# Patient Record
Sex: Male | Born: 1950 | Race: White | Hispanic: No | Marital: Married | Smoking: Former smoker
Health system: Southern US, Community
[De-identification: ages and names within clinical notes are randomized; demographics above are authoritative.]

## PROBLEM LIST (undated history)

## (undated) DIAGNOSIS — C3490 Malignant neoplasm of unspecified part of unspecified bronchus or lung: Secondary | ICD-10-CM

## (undated) DIAGNOSIS — I10 Essential (primary) hypertension: Secondary | ICD-10-CM

## (undated) DIAGNOSIS — J439 Emphysema, unspecified: Secondary | ICD-10-CM

## (undated) HISTORY — PX: COLONOSCOPY: SHX174

---

## 2011-09-13 ENCOUNTER — Other Ambulatory Visit: Payer: Self-pay | Admitting: Internal Medicine

## 2011-09-13 DIAGNOSIS — M79606 Pain in leg, unspecified: Secondary | ICD-10-CM

## 2011-09-16 ENCOUNTER — Other Ambulatory Visit: Payer: Self-pay | Admitting: Internal Medicine

## 2011-09-16 ENCOUNTER — Ambulatory Visit
Admission: RE | Admit: 2011-09-16 | Discharge: 2011-09-16 | Disposition: A | Discharge status: Home / Self Care | Payer: BC Managed Care – PPO | Source: Ambulatory Visit | Attending: Internal Medicine | Admitting: Internal Medicine

## 2011-09-16 DIAGNOSIS — M79606 Pain in leg, unspecified: Secondary | ICD-10-CM

## 2014-03-02 ENCOUNTER — Encounter (HOSPITAL_COMMUNITY): Payer: Self-pay | Admitting: Emergency Medicine

## 2014-03-02 ENCOUNTER — Emergency Department (HOSPITAL_COMMUNITY)
Admission: EM | Admit: 2014-03-02 | Discharge: 2014-03-02 | Disposition: A | Discharge status: Home / Self Care | Payer: BC Managed Care – PPO | Source: Home / Self Care | Attending: Family Medicine | Admitting: Family Medicine

## 2014-03-02 DIAGNOSIS — S30860A Insect bite (nonvenomous) of lower back and pelvis, initial encounter: Secondary | ICD-10-CM

## 2014-03-02 DIAGNOSIS — W57XXXA Bitten or stung by nonvenomous insect and other nonvenomous arthropods, initial encounter: Principal | ICD-10-CM

## 2014-03-02 DIAGNOSIS — S30861A Insect bite (nonvenomous) of abdominal wall, initial encounter: Secondary | ICD-10-CM

## 2014-03-02 MED ORDER — DOXYCYCLINE HYCLATE 100 MG PO CAPS: 100.0000 mg | ORAL_CAPSULE | Freq: Two times a day (BID) | ORAL | Status: DC

## 2014-03-02 NOTE — ED Notes (Signed)
Pt presents after removing a tick from his inguinal area today. Pt reports 4 days ago he noticed a tiny black spot with itching and thought it was a spider bite. Pt reports after removal area is tender, red and swollen. Pt denies fever. Pt is alert and oriented and in no acute distress.

## 2014-03-02 NOTE — ED Provider Notes (Signed)
CSN: [id number]     Arrival date & time 03/02/14  1253 History   First MD Initiated Contact with Patient 03/02/14 1358     Chief Complaint  Patient presents with  . Insect Bite   (Consider location/radiation/quality/duration/timing/severity/associated sxs/prior Treatment) Patient is a 63 y.o. male presenting with rash. The history is provided by the patient.  Rash Quality: painful, redness and swelling   Pain details:    Severity:  Mild   Progression:  Worsening Severity:  Mild Chronicity:  New Context comment:  Noticed tick with local rxn to left inguinal area this am, possible bite on tues.   History reviewed. No pertinent past medical history. History reviewed. No pertinent past surgical history. No family history on file. History  Substance Use Topics  . Smoking status: Current Every Day Smoker -- 1.00 packs/day    Types: Cigarettes  . Smokeless tobacco: Not on file  . Alcohol Use: Yes     Comment: occasionally    Review of Systems  Constitutional: Negative.   Skin: Positive for rash and wound.    Allergies  Review of patient's allergies indicates no known allergies.  Home Medications   Current Outpatient Rx  Name  Route  Sig  Dispense  Refill  . doxycycline (VIBRAMYCIN) 100 MG capsule   Oral   Take 1 capsule (100 mg total) by mouth 2 (two) times daily.   20 capsule   0    BP 120/86  Pulse 79  Temp(Src) 98.1 F (36.7 C) (Oral)  Resp 18  SpO2 100% Physical Exam  Nursing note and vitals reviewed. Constitutional: He is oriented to person, place, and time. He appears well-developed and well-nourished.  Neurological: He is alert and oriented to person, place, and time.  Skin: Skin is warm and dry. Rash noted. There is erythema.  Local left inguinal bite with sts and erythema .    ED Course  Procedures (including critical care time) Labs Review Labs Reviewed - No data to display Imaging Review No results found.   MDM   1. Tick bite of groin         Linna HoffJames D Meeghan Skipper, MD 03/02/14 1434

## 2015-10-14 ENCOUNTER — Ambulatory Visit (INDEPENDENT_AMBULATORY_CARE_PROVIDER_SITE_OTHER): Payer: BLUE CROSS/BLUE SHIELD | Admitting: Family Medicine

## 2015-10-14 VITALS — BP 120/80 | HR 65 | Temp 98.2°F | Resp 16 | Ht 68.1 in | Wt 146.0 lb

## 2015-10-14 DIAGNOSIS — S71101A Unspecified open wound, right thigh, initial encounter: Secondary | ICD-10-CM

## 2015-10-14 DIAGNOSIS — W540XXA Bitten by dog, initial encounter: Secondary | ICD-10-CM

## 2015-10-14 DIAGNOSIS — Z23 Encounter for immunization: Secondary | ICD-10-CM | POA: Diagnosis not present

## 2015-10-14 DIAGNOSIS — S71001A Unspecified open wound, right hip, initial encounter: Secondary | ICD-10-CM

## 2015-10-14 MED ORDER — AMOXICILLIN-POT CLAVULANATE 875-125 MG PO TABS: 1.0000 | ORAL_TABLET | Freq: Two times a day (BID) | ORAL | Status: DC

## 2015-10-14 NOTE — Progress Notes (Signed)
Patient ID: Joe HarveyJohn Morton, male    DOB: 07/12/1951  Age: 64 y.o. MRN: [id number]  Chief Complaint  Patient presents with  . 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 He was at a customer's site yesterday and their dog, a [place] shepherd, cervical him and attacked him, biting him on the right thigh. The patient had on blue jeans, and the wound penetrated the cough. He has not had any muscular type pains in the leg. He has bite marks separated by about 10 cm. Each of the sites has several little puncture wounds in 1 cm clusters. The dog owners were present at the time. The patient has checked the dog records, and the dog is up-to-date on vaccinations. The dog has a history of knitting at other people in the past.  Current allergies, medications, problem list, past/family and social histories reviewed.  Objective:  BP 120/80 mmHg  Pulse 65  Temp(Src) 98.2 F (36.8 C) (Oral)  Resp 16  Ht 5' 8.1" (1.73 m)  Wt 146 lb (66.225 kg)  BMI 22.13 kg/m2  SpO2 98%  Puncture wounds on right mid thigh, anterior aspect. Both wounds are a little over a centimeter in diameter with mild erythema surrounding them. He is able to extend the knee without any significant thigh pain.  Assessment & Plan:   Assessment: No diagnosis found.    Plan: Tetanus vaccination and antibiotics  No orders of the defined types were placed in this encounter.    No orders of the defined types were placed in this encounter.         There are no Patient Instructions on file for this visit.   No Follow-up on file.   Joe Vath, MD 10/14/2015

## 2015-10-14 NOTE — Patient Instructions (Signed)
Keep wound clean  Return if worsening signs of infection  Take Augmentin 875 one twice daily for infection (amoxicillin/clavulanate)  Tetanus vaccine will be given you today  Return as needed.

## 2017-12-29 ENCOUNTER — Other Ambulatory Visit: Payer: Self-pay | Admitting: Geriatric Medicine

## 2017-12-29 DIAGNOSIS — F17209 Nicotine dependence, unspecified, with unspecified nicotine-induced disorders: Secondary | ICD-10-CM

## 2018-01-01 ENCOUNTER — Ambulatory Visit
Admission: RE | Admit: 2018-01-01 | Discharge: 2018-01-01 | Disposition: A | Discharge status: Home / Self Care | Payer: Self-pay | Source: Ambulatory Visit | Attending: Geriatric Medicine | Admitting: Geriatric Medicine

## 2018-01-01 DIAGNOSIS — F17209 Nicotine dependence, unspecified, with unspecified nicotine-induced disorders: Secondary | ICD-10-CM

## 2018-07-24 ENCOUNTER — Ambulatory Visit: Payer: Self-pay | Admitting: General Surgery

## 2018-09-07 ENCOUNTER — Encounter (HOSPITAL_COMMUNITY): Payer: Self-pay

## 2018-09-07 NOTE — Pre-Procedure Instructions (Signed)
Joe Morton  09/07/2018      [id number] [place], [place] - [address] [place] [address] Phone: [phone] Fax: [phone]    Your procedure is scheduled on 09/17/2018.  Report to [name] Admitting at 0530 A.M.  Call this number if you have problems the morning of surgery:  [phone]   Remember:  Do not eat or drink after midnight.     Take these medicines the morning of surgery with A SIP OF WATER: NONE  7 days prior to surgery STOP taking any Aspirin(unless otherwise instructed by your surgeon), Aleve, Naproxen, Ibuprofen, Motrin, Advil, Goody's, BC's, all herbal medications, fish oil, and all vitamins     Do not wear jewelry.  Do not wear lotions, powders, or colognes, or deodorant.  Men may shave face and neck.  Do not bring valuables to the hospital.  [name] is not responsible for any belongings or valuables.  Contacts, eyeglasses, dentures or bridgework may not be worn into surgery.  Leave your suitcase in the car.  After surgery it may be brought to your room.  For patients admitted to the hospital, discharge time will be determined by your treatment team.  Patients discharged the day of surgery will not be allowed to drive home.   Name and phone number of your driver:    Special instructions:   Joe Morton- Preparing For Surgery  Before surgery, you can play an important role. Because skin is not sterile, your skin needs to be as free of germs as possible. You can reduce the number of germs on your skin by washing with CHG (chlorahexidine gluconate) Soap before surgery.  CHG is an antiseptic cleaner which kills germs and bonds with the skin to continue killing germs even after washing.    Oral Hygiene is also important to reduce your risk of infection.  Remember - BRUSH YOUR TEETH THE MORNING OF SURGERY WITH YOUR REGULAR TOOTHPASTE  Please do not use if you have an allergy to CHG or  antibacterial soaps. If your skin becomes reddened/irritated stop using the CHG.  Do not shave (including legs and underarms) for at least 48 hours prior to first CHG shower. It is OK to shave your face.  Please follow these instructions carefully.   1. Shower the NIGHT BEFORE SURGERY and the MORNING OF SURGERY with CHG.   2. If you chose to wash your hair, wash your hair first as usual with your normal shampoo.  3. After you shampoo, rinse your hair and body thoroughly to remove the shampoo.  4. Use CHG as you would any other liquid soap. You can apply CHG directly to the skin and wash gently with a scrungie or a clean washcloth.   5. Apply the CHG Soap to your body ONLY FROM THE NECK DOWN.  Do not use on open wounds or open sores. Avoid contact with your eyes, ears, mouth and genitals (private parts). Wash Face and genitals (private parts)  with your normal soap.  6. Wash thoroughly, paying special attention to the area where your surgery will be performed.  7. Thoroughly rinse your body with warm water from the neck down.  8. DO NOT shower/wash with your normal soap after using and rinsing off the CHG Soap.  9. Pat yourself dry with a CLEAN TOWEL.  10. Wear CLEAN PAJAMAS to bed the night before surgery, wear comfortable clothes the morning of surgery  11. Place  CLEAN SHEETS on your bed the night of your first shower and DO NOT SLEEP WITH PETS.    Day of Surgery: Shower as stated above. Do not apply any deodorants/lotions.  Please wear clean clothes to the hospital/surgery center.   Remember to brush your teeth WITH YOUR REGULAR TOOTHPASTE.    Please read over the following fact sheets that you were given. Coughing and Deep Breathing and Surgical Site Infection Prevention

## 2018-09-10 ENCOUNTER — Encounter (HOSPITAL_COMMUNITY)
Admission: RE | Admit: 2018-09-10 | Discharge: 2018-09-10 | Disposition: A | Discharge status: Home / Self Care | Payer: Medicare Other | Source: Ambulatory Visit | Attending: General Surgery | Admitting: General Surgery

## 2018-09-10 ENCOUNTER — Other Ambulatory Visit: Payer: Self-pay

## 2018-09-10 ENCOUNTER — Ambulatory Visit (HOSPITAL_COMMUNITY)
Admission: RE | Admit: 2018-09-10 | Discharge: 2018-09-10 | Disposition: A | Discharge status: Home / Self Care | Payer: Medicare Other | Source: Ambulatory Visit | Attending: General Surgery | Admitting: General Surgery

## 2018-09-10 ENCOUNTER — Encounter (HOSPITAL_COMMUNITY): Payer: Self-pay

## 2018-09-10 DIAGNOSIS — R918 Other nonspecific abnormal finding of lung field: Secondary | ICD-10-CM | POA: Diagnosis not present

## 2018-09-10 DIAGNOSIS — F17200 Nicotine dependence, unspecified, uncomplicated: Secondary | ICD-10-CM | POA: Insufficient documentation

## 2018-09-10 DIAGNOSIS — Z01818 Encounter for other preprocedural examination: Secondary | ICD-10-CM | POA: Diagnosis not present

## 2018-09-10 LAB — CBC WITH DIFFERENTIAL/PLATELET
ABS IMMATURE GRANULOCYTES: 0.04 10*3/uL (ref 0.00–0.07)
BASOS ABS: 0.1 10*3/uL (ref 0.0–0.1)
BASOS PCT: 1 %
Eosinophils Absolute: 0.2 10*3/uL (ref 0.0–0.5)
Eosinophils Relative: 3 %
HCT: 56.8 % — ABNORMAL HIGH (ref 39.0–52.0)
HEMOGLOBIN: 18.3 g/dL — AB (ref 13.0–17.0)
IMMATURE GRANULOCYTES: 1 %
LYMPHS PCT: 26 %
Lymphs Abs: 1.5 10*3/uL (ref 0.7–4.0)
MCH: 32.2 pg (ref 26.0–34.0)
MCHC: 32.2 g/dL (ref 30.0–36.0)
MCV: 99.8 fL (ref 80.0–100.0)
MONO ABS: 0.9 10*3/uL (ref 0.1–1.0)
Monocytes Relative: 15 %
NEUTROS ABS: 3.2 10*3/uL (ref 1.7–7.7)
NEUTROS PCT: 54 %
NRBC: 0 % (ref 0.0–0.2)
PLATELETS: 221 10*3/uL (ref 150–400)
RBC: 5.69 MIL/uL (ref 4.22–5.81)
RDW: 14.1 % (ref 11.5–15.5)
WBC: 5.9 10*3/uL (ref 4.0–10.5)

## 2018-09-10 LAB — BASIC METABOLIC PANEL
Anion gap: 9 (ref 5–15)
BUN: 8 mg/dL (ref 8–23)
CO2: 25 mmol/L (ref 22–32)
Calcium: 9.6 mg/dL (ref 8.9–10.3)
Chloride: 103 mmol/L (ref 98–111)
Creatinine, Ser: 0.89 mg/dL (ref 0.61–1.24)
Glucose, Bld: 100 mg/dL — ABNORMAL HIGH (ref 70–99)
Potassium: 4.5 mmol/L (ref 3.5–5.1)
SODIUM: 137 mmol/L (ref 135–145)

## 2018-09-10 NOTE — Progress Notes (Signed)
PCP - Dr. Pete Glatter  Chest x-ray - 09/10/18 EKG - 09/10/18  Blood Thinner Instructions: N/A Aspirin Instructions: N/A  Anesthesia review: none  Patient denies shortness of breath, fever, cough and chest pain at PAT appointment   Patient verbalized understanding of instructions that were given to them at the PAT appointment. Patient was also instructed that they will need to review over the PAT instructions again at home before surgery.

## 2018-09-14 MED ORDER — BUPIVACAINE LIPOSOME 1.3 % IJ SUSP
20.0000 mL | INTRAMUSCULAR | Status: AC
  Administered 2018-09-17: 10 mL
  Filled 2018-09-14: qty 20

## 2018-09-16 NOTE — Anesthesia Preprocedure Evaluation (Addendum)
Anesthesia Evaluation  Patient identified by MRN, date of birth, ID band Patient awake    Reviewed: Allergy & Precautions, NPO status , Patient's Chart, lab work & pertinent test results  Airway Mallampati: I       Dental no notable dental hx. (+) Teeth Intact   Pulmonary Current Smoker,    Pulmonary exam normal breath sounds clear to auscultation       Cardiovascular negative cardio ROS Normal cardiovascular exam Rhythm:Regular Rate:Normal     Neuro/Psych negative neurological ROS  negative psych ROS   GI/Hepatic negative GI ROS, Neg liver ROS,   Endo/Other  negative endocrine ROS  Renal/GU negative Renal ROS  negative genitourinary   Musculoskeletal negative musculoskeletal ROS (+)   Abdominal Normal abdominal exam  (+)   Peds  Hematology negative hematology ROS (+)   Anesthesia Other Findings   Reproductive/Obstetrics                            Anesthesia Physical Anesthesia Plan  ASA: II  Anesthesia Plan: General   Post-op Pain Management:  Regional for Post-op pain   Induction: Intravenous  PONV Risk Score and Plan:   Airway Management Planned: LMA  Additional Equipment:   Intra-op Plan:   Post-operative Plan: Extubation in OR  Informed Consent: I have reviewed the patients History and Physical, chart, labs and discussed the procedure including the risks, benefits and alternatives for the proposed anesthesia with the patient or authorized representative who has indicated his/her understanding and acceptance.     Plan Discussed with: CRNA and Surgeon  Anesthesia Plan Comments:        Anesthesia Quick Evaluation

## 2018-09-17 ENCOUNTER — Encounter (HOSPITAL_COMMUNITY): Payer: Self-pay

## 2018-09-17 ENCOUNTER — Ambulatory Visit (HOSPITAL_COMMUNITY): Payer: Medicare Other | Admitting: Anesthesiology

## 2018-09-17 ENCOUNTER — Encounter (HOSPITAL_COMMUNITY)
Admission: RE | Disposition: A | Discharge status: Home / Self Care | Payer: Self-pay | Source: Ambulatory Visit | Attending: General Surgery

## 2018-09-17 ENCOUNTER — Ambulatory Visit (HOSPITAL_COMMUNITY)
Admission: RE | Admit: 2018-09-17 | Discharge: 2018-09-17 | Disposition: A | Discharge status: Home / Self Care | Payer: Medicare Other | Source: Ambulatory Visit | Attending: General Surgery | Admitting: General Surgery

## 2018-09-17 ENCOUNTER — Other Ambulatory Visit: Payer: Self-pay

## 2018-09-17 DIAGNOSIS — K4090 Unilateral inguinal hernia, without obstruction or gangrene, not specified as recurrent: Secondary | ICD-10-CM | POA: Diagnosis not present

## 2018-09-17 DIAGNOSIS — F17200 Nicotine dependence, unspecified, uncomplicated: Secondary | ICD-10-CM | POA: Insufficient documentation

## 2018-09-17 HISTORY — PX: INSERTION OF MESH: SHX5868

## 2018-09-17 HISTORY — PX: INGUINAL HERNIA REPAIR: SHX194

## 2018-09-17 SURGERY — REPAIR, HERNIA, INGUINAL, ADULT
Anesthesia: General | Site: Inguinal | Laterality: Left

## 2018-09-17 MED ORDER — OXYCODONE HCL 5 MG/5ML PO SOLN: 5.0000 mg | Freq: Once | ORAL | Status: AC | PRN

## 2018-09-17 MED ORDER — LACTATED RINGERS IV SOLN
INTRAVENOUS | Status: DC | PRN
  Administered 2018-09-17: 07:00:00 via INTRAVENOUS

## 2018-09-17 MED ORDER — LIDOCAINE 2% (20 MG/ML) 5 ML SYRINGE
INTRAMUSCULAR | Status: DC | PRN
  Administered 2018-09-17: 100 mg via INTRAVENOUS

## 2018-09-17 MED ORDER — ONDANSETRON HCL 4 MG/2ML IJ SOLN: 4.0000 mg | Freq: Once | INTRAMUSCULAR | Status: DC | PRN

## 2018-09-17 MED ORDER — ACETAMINOPHEN 500 MG PO TABS
1000.0000 mg | ORAL_TABLET | ORAL | Status: AC
  Administered 2018-09-17: 1000 mg via ORAL
  Filled 2018-09-17: qty 2

## 2018-09-17 MED ORDER — PROPOFOL 10 MG/ML IV BOLUS
INTRAVENOUS | Status: DC | PRN
  Administered 2018-09-17: 150 mg via INTRAVENOUS

## 2018-09-17 MED ORDER — CHLORHEXIDINE GLUCONATE CLOTH 2 % EX PADS: 6.0000 | MEDICATED_PAD | Freq: Once | CUTANEOUS | Status: DC

## 2018-09-17 MED ORDER — MIDAZOLAM HCL 5 MG/5ML IJ SOLN
INTRAMUSCULAR | Status: DC | PRN
  Administered 2018-09-17 (×2): 1 mg via INTRAVENOUS

## 2018-09-17 MED ORDER — SODIUM CHLORIDE 0.9 % IV SOLN
INTRAVENOUS | Status: AC
  Filled 2018-09-17: qty 500000

## 2018-09-17 MED ORDER — ACETAMINOPHEN 160 MG/5ML PO SOLN: 325.0000 mg | ORAL | Status: DC | PRN

## 2018-09-17 MED ORDER — FENTANYL CITRATE (PF) 100 MCG/2ML IJ SOLN
INTRAMUSCULAR | Status: AC
  Filled 2018-09-17: qty 2

## 2018-09-17 MED ORDER — ACETAMINOPHEN 325 MG PO TABS: 325.0000 mg | ORAL_TABLET | ORAL | Status: DC | PRN

## 2018-09-17 MED ORDER — GABAPENTIN 300 MG PO CAPS
300.0000 mg | ORAL_CAPSULE | ORAL | Status: AC
  Administered 2018-09-17: 300 mg via ORAL
  Filled 2018-09-17: qty 1

## 2018-09-17 MED ORDER — MEPERIDINE HCL 50 MG/ML IJ SOLN: 6.2500 mg | INTRAMUSCULAR | Status: DC | PRN

## 2018-09-17 MED ORDER — ONDANSETRON HCL 4 MG/2ML IJ SOLN
INTRAMUSCULAR | Status: DC | PRN
  Administered 2018-09-17: 4 mg via INTRAVENOUS

## 2018-09-17 MED ORDER — FENTANYL CITRATE (PF) 100 MCG/2ML IJ SOLN
INTRAMUSCULAR | Status: DC | PRN
  Administered 2018-09-17 (×2): 50 ug via INTRAVENOUS
  Administered 2018-09-17: 25 ug via INTRAVENOUS
  Administered 2018-09-17: 50 ug via INTRAVENOUS
  Administered 2018-09-17: 25 ug via INTRAVENOUS
  Administered 2018-09-17: 50 ug via INTRAVENOUS

## 2018-09-17 MED ORDER — KETOROLAC TROMETHAMINE 30 MG/ML IJ SOLN: 30.0000 mg | Freq: Once | INTRAMUSCULAR | Status: DC | PRN

## 2018-09-17 MED ORDER — 0.9 % SODIUM CHLORIDE (POUR BTL) OPTIME
TOPICAL | Status: DC | PRN
  Administered 2018-09-17: 1000 mL

## 2018-09-17 MED ORDER — OXYCODONE HCL 5 MG PO TABS
ORAL_TABLET | ORAL | Status: AC
  Filled 2018-09-17: qty 1

## 2018-09-17 MED ORDER — HEMOSTATIC AGENTS (NO CHARGE) OPTIME
TOPICAL | Status: DC | PRN
  Administered 2018-09-17: 1 via TOPICAL

## 2018-09-17 MED ORDER — MIDAZOLAM HCL 2 MG/2ML IJ SOLN
INTRAMUSCULAR | Status: AC
  Filled 2018-09-17: qty 2

## 2018-09-17 MED ORDER — SODIUM CHLORIDE 0.9 % IV SOLN
INTRAVENOUS | Status: DC | PRN
  Administered 2018-09-17: 500 mL

## 2018-09-17 MED ORDER — OXYCODONE HCL 5 MG PO TABS
5.0000 mg | ORAL_TABLET | Freq: Once | ORAL | Status: AC | PRN
  Administered 2018-09-17: 5 mg via ORAL

## 2018-09-17 MED ORDER — FENTANYL CITRATE (PF) 250 MCG/5ML IJ SOLN
INTRAMUSCULAR | Status: AC
  Filled 2018-09-17: qty 5

## 2018-09-17 MED ORDER — CELECOXIB 200 MG PO CAPS
200.0000 mg | ORAL_CAPSULE | ORAL | Status: AC
  Administered 2018-09-17: 200 mg via ORAL
  Filled 2018-09-17: qty 1

## 2018-09-17 MED ORDER — BUPIVACAINE-EPINEPHRINE (PF) 0.25% -1:200000 IJ SOLN
INTRAMUSCULAR | Status: AC
  Filled 2018-09-17: qty 30

## 2018-09-17 MED ORDER — OXYCODONE HCL 5 MG PO TABS: 5.0000 mg | ORAL_TABLET | Freq: Four times a day (QID) | ORAL | 0 refills | Status: DC | PRN

## 2018-09-17 MED ORDER — CEFAZOLIN SODIUM-DEXTROSE 2-4 GM/100ML-% IV SOLN
2.0000 g | INTRAVENOUS | Status: AC
  Administered 2018-09-17: 2 g via INTRAVENOUS
  Filled 2018-09-17: qty 100

## 2018-09-17 MED ORDER — PROPOFOL 10 MG/ML IV BOLUS
INTRAVENOUS | Status: AC
  Filled 2018-09-17: qty 40

## 2018-09-17 MED ORDER — BUPIVACAINE-EPINEPHRINE (PF) 0.5% -1:200000 IJ SOLN
INTRAMUSCULAR | Status: DC | PRN
  Administered 2018-09-17: 50 mL

## 2018-09-17 MED ORDER — FENTANYL CITRATE (PF) 100 MCG/2ML IJ SOLN
25.0000 ug | INTRAMUSCULAR | Status: DC | PRN
  Administered 2018-09-17: 25 ug via INTRAVENOUS
  Administered 2018-09-17: 50 ug via INTRAVENOUS

## 2018-09-17 SURGICAL SUPPLY — 56 items
BAG DECANTER FOR FLEXI CONT (MISCELLANEOUS) ×3 IMPLANT
BLADE CLIPPER SURG (BLADE) ×3 IMPLANT
BLADE SURG 10 STRL SS (BLADE) ×3 IMPLANT
BLADE SURG 15 STRL LF DISP TIS (BLADE) ×1 IMPLANT
BLADE SURG 15 STRL SS (BLADE) ×2
CHLORAPREP W/TINT 26ML (MISCELLANEOUS) ×3 IMPLANT
CLEANER TIP ELECTROSURG 2X2 (MISCELLANEOUS) ×3 IMPLANT
CLOSURE WOUND 1/2 X4 (GAUZE/BANDAGES/DRESSINGS) ×1
COVER SURGICAL LIGHT HANDLE (MISCELLANEOUS) ×3 IMPLANT
DERMABOND ADVANCED (GAUZE/BANDAGES/DRESSINGS) ×2
DERMABOND ADVANCED .7 DNX12 (GAUZE/BANDAGES/DRESSINGS) ×1 IMPLANT
DRAIN PENROSE 1/2X12 LTX STRL (WOUND CARE) ×3 IMPLANT
DRAPE LAPAROTOMY TRNSV 102X78 (DRAPE) ×3 IMPLANT
DRAPE UTILITY XL STRL (DRAPES) ×3 IMPLANT
DRSG TEGADERM 4X4.75 (GAUZE/BANDAGES/DRESSINGS) ×3 IMPLANT
ELECT REM PT RETURN 9FT ADLT (ELECTROSURGICAL) ×3
ELECTRODE REM PT RTRN 9FT ADLT (ELECTROSURGICAL) ×1 IMPLANT
GAUZE 4X4 16PLY RFD (DISPOSABLE) ×6 IMPLANT
GLOVE BIO SURGEON STRL SZ 6 (GLOVE) ×3 IMPLANT
GLOVE BIOGEL PI IND STRL 6.5 (GLOVE) ×1 IMPLANT
GLOVE BIOGEL PI IND STRL 7.0 (GLOVE) ×1 IMPLANT
GLOVE BIOGEL PI IND STRL 8 (GLOVE) ×1 IMPLANT
GLOVE BIOGEL PI INDICATOR 6.5 (GLOVE) ×2
GLOVE BIOGEL PI INDICATOR 7.0 (GLOVE) ×2
GLOVE BIOGEL PI INDICATOR 8 (GLOVE) ×2
GLOVE ECLIPSE 7.5 STRL STRAW (GLOVE) ×3 IMPLANT
GLOVE EUDERMIC 7 POWDERFREE (GLOVE) ×3 IMPLANT
GLOVE SURG SS PI 7.0 STRL IVOR (GLOVE) ×6 IMPLANT
GOWN STRL REUS W/ TWL LRG LVL3 (GOWN DISPOSABLE) ×3 IMPLANT
GOWN STRL REUS W/ TWL XL LVL3 (GOWN DISPOSABLE) ×1 IMPLANT
GOWN STRL REUS W/TWL LRG LVL3 (GOWN DISPOSABLE) ×6
GOWN STRL REUS W/TWL XL LVL3 (GOWN DISPOSABLE) ×2
HEMOSTAT SURGICEL 2X14 (HEMOSTASIS) ×3 IMPLANT
KIT BASIN OR (CUSTOM PROCEDURE TRAY) ×3 IMPLANT
KIT TURNOVER KIT B (KITS) ×3 IMPLANT
MESH HERNIA 3X6 (Mesh General) ×3 IMPLANT
NEEDLE HYPO 25GX1X1/2 BEV (NEEDLE) ×3 IMPLANT
NS IRRIG 1000ML POUR BTL (IV SOLUTION) ×3 IMPLANT
PACK SURGICAL SETUP 50X90 (CUSTOM PROCEDURE TRAY) ×3 IMPLANT
PAD ARMBOARD 7.5X6 YLW CONV (MISCELLANEOUS) ×3 IMPLANT
PENCIL SMOKE EVAC W/HOLSTER (ELECTROSURGICAL) ×3 IMPLANT
SPONGE INTESTINAL PEANUT (DISPOSABLE) ×3 IMPLANT
SPONGE LAP 18X18 X RAY DECT (DISPOSABLE) ×3 IMPLANT
STRIP CLOSURE SKIN 1/2X4 (GAUZE/BANDAGES/DRESSINGS) ×2 IMPLANT
SUT ETHIBOND 0 MO6 C/R (SUTURE) ×3 IMPLANT
SUT MON AB 4-0 PC3 18 (SUTURE) ×3 IMPLANT
SUT PROLENE 0 CT 2 (SUTURE) ×6 IMPLANT
SUT VIC AB 3-0 SH 27 (SUTURE) ×6
SUT VIC AB 3-0 SH 27X BRD (SUTURE) ×3 IMPLANT
SUT VICRYL AB 3 0 TIES (SUTURE) ×3 IMPLANT
SYR BULB 3OZ (MISCELLANEOUS) ×3 IMPLANT
SYR CONTROL 10ML LL (SYRINGE) ×3 IMPLANT
TOWEL GREEN STERILE (TOWEL DISPOSABLE) ×3 IMPLANT
TUBE CONNECTING 12'X1/4 (SUCTIONS) ×1
TUBE CONNECTING 12X1/4 (SUCTIONS) ×2 IMPLANT
YANKAUER SUCT BULB TIP NO VENT (SUCTIONS) ×3 IMPLANT

## 2018-09-17 NOTE — Anesthesia Procedure Notes (Signed)
Procedure Name: LMA Insertion Date/Time: 09/17/2018 7:37 AM Performed by: Mayer Camel, CRNA Pre-anesthesia Checklist: Patient identified, Emergency Drugs available, Suction available and Patient being monitored Patient Re-evaluated:Patient Re-evaluated prior to induction Oxygen Delivery Method: Circle System Utilized Preoxygenation: Pre-oxygenation with 100% oxygen Induction Type: IV induction Ventilation: Mask ventilation without difficulty LMA: LMA inserted LMA Size: 5.0 Number of attempts: 1 Airway Equipment and Method: Bite block Placement Confirmation: positive ETCO2 Tube secured with: Tape Dental Injury: Teeth and Oropharynx as per pre-operative assessment

## 2018-09-17 NOTE — Anesthesia Procedure Notes (Addendum)
Anesthesia Regional Block: TAP block   Pre-Anesthetic Checklist: ,, timeout performed, Correct Patient, Correct Site, Correct Laterality, Correct Procedure, Correct Position, site marked, Risks and benefits discussed,  Surgical consent,  Pre-op evaluation,  At surgeon's request and post-op pain management  Laterality: Left and Lower  Prep: chloraprep       Needles:  Injection technique: Single-shot  Needle Type: Echogenic Stimulator Needle     Needle Length: 9cm  Needle Gauge: 21   Needle insertion depth: 2 cm   Additional Needles:   Procedures:,,,, ultrasound used (permanent image in chart),,,,  Narrative:  Start time: 09/17/2018 7:07 AM End time: 09/17/2018 7:24 AM Injection made incrementally with aspirations every 5 mL. Anesthesiologist: Leilani Able, MD

## 2018-09-17 NOTE — Anesthesia Postprocedure Evaluation (Signed)
Anesthesia Post Note  Patient: Joe Morton  Procedure(s) Performed: OPEN LEFT INGUINAL HERNIA REPAIR WITH MESH (Left Inguinal) INSERTION OF MESH (Left Inguinal)     Patient location during evaluation: PACU Anesthesia Type: General Level of consciousness: sedated Pain management: pain level controlled Vital Signs Assessment: post-procedure vital signs reviewed and stable Respiratory status: spontaneous breathing Cardiovascular status: stable Postop Assessment: no apparent nausea or vomiting Anesthetic complications: no    Last Vitals:  Vitals:   09/17/18 1025 09/17/18 1039  BP:  133/87  Pulse: 70 67  Resp: 11 12  Temp:    SpO2: 97% 99%    Last Pain:  Vitals:   09/17/18 1039  TempSrc:   PainSc: 4    Pain Goal: Patients Stated Pain Goal: 1 (09/17/18 0601)               [name]

## 2018-09-17 NOTE — Discharge Instructions (Addendum)
Open Hernia Repair, Adult, Care After These instructions give you information about caring for yourself after your procedure. Your doctor may also give you more specific instructions. If you have problems or questions, contact your doctor. Follow these instructions at home: Surgical cut (incision) care   Follow instructions from your doctor about how to take care of your surgical cut area. Make sure you: ? Wash your hands with soap and water before you change your bandage (dressing). If you cannot use soap and water, use hand sanitizer. ? Change your bandage as told by your doctor. ? Leave stitches (sutures), skin glue, or skin tape (adhesive) strips in place. They may need to stay in place for 2 weeks or longer. If tape strips get loose and curl up, you may trim the loose edges. Do not remove tape strips completely unless your doctor says it is okay.  Check your surgical cut every day for signs of infection. Check for: ? More redness, swelling, or pain. ? More fluid or blood. ? Warmth. ? Pus or a bad smell. Activity  Do not drive or use heavy machinery while taking prescription pain medicine. Do not drive until your doctor says it is okay.  Until your doctor says it is okay: ? Do not lift anything that is heavier than 10 lb (4.5 kg). ? Do not play contact sports.  Return to your normal activities as told by your doctor. Ask your doctor what activities are safe. General instructions  To prevent or treat having a hard time pooping (constipation) while you are taking prescription pain medicine, your doctor may recommend that you: ? Drink enough fluid to keep your pee (urine) clear or pale yellow. ? Take over-the-counter or prescription medicines. ? Eat foods that are high in fiber, such as fresh fruits and vegetables, whole grains, and beans. ? Limit foods that are high in fat and processed sugars, such as fried and sweet foods.  Take over-the-counter and prescription medicines only as  told by your doctor.  Do not take baths, swim, or use a hot tub until your doctor says it is okay.  Keep all follow-up visits as told by your doctor. This is important.  You will note some bruiding going into the genital area the is expected.  It should not be tense or very painful swelling  Leave dressing intact until seen in clinic Contact a doctor if:  You develop a rash.  You have more redness, swelling, or pain around your surgical cut.  You have more fluid or blood coming from your surgical cut.  Your surgical cut feels warm to the touch.  You have pus or a bad smell coming from your surgical cut.  You have a fever or chills.  You have blood in your poop (stool).  You have not pooped in 2-3 days.  Medicine does not help your pain. Get help right away if:  You have chest pain or you are short of breath.  You feel light-headed.  You feel weak and dizzy (feel faint).  You have very bad pain.  You throw up (vomit) and your pain is worse. This information is not intended to replace advice given to you by your health care provider. Make sure you discuss any questions you have with your health care provider. Document Released: 11/28/2014 Document Revised: 05/27/2016 Document Reviewed: 04/20/2016 Elsevier Interactive Patient Education  2018 [employer].   Post Anesthesia Home Care Instructions  Activity: Get plenty of rest for the remainder of the  day. A responsible individual must stay with you for 24 hours following the procedure.  For the next 24 hours, DO NOT: -Drive a car -Advertising copywriter -Drink alcoholic beverages -Take any medication unless instructed by your physician -Make any legal decisions or sign important papers.  Meals: Start with liquid foods such as gelatin or soup. Progress to regular foods as tolerated. Avoid greasy, spicy, heavy foods. If nausea and/or vomiting occur, drink only clear liquids until the nausea and/or vomiting subsides. Call  your physician if vomiting continues.  Special Instructions/Symptoms: Your throat may feel dry or sore from the anesthesia or the breathing tube placed in your throat during surgery. If this causes discomfort, gargle with warm salt water. The discomfort should disappear within 24 hours.  If you had a scopolamine patch placed behind your ear for the management of post- operative nausea and/or vomiting:  1. The medication in the patch is effective for 72 hours, after which it should be removed.  Wrap patch in a tissue and discard in the trash. Wash hands thoroughly with soap and water. 2. You may remove the patch earlier than 72 hours if you experience unpleasant side effects which may include dry mouth, dizziness or visual disturbances. 3. Avoid touching the patch. Wash your hands with soap and water after contact with the patch.

## 2018-09-17 NOTE — Transfer of Care (Signed)
Immediate Anesthesia Transfer of Care Note  Patient: Joe Morton  Procedure(s) Performed: OPEN LEFT INGUINAL HERNIA REPAIR WITH MESH (Left Inguinal) INSERTION OF MESH (Left Inguinal)  Patient Location: PACU  Anesthesia Type:General  Level of Consciousness: awake, alert  and oriented  Airway & Oxygen Therapy: Patient Spontanous Breathing and Patient connected to nasal cannula oxygen  Post-op Assessment: Report given to RN and Post -op Vital signs reviewed and stable  Post vital signs: Reviewed and stable  Last Vitals:  Vitals Value Taken Time  BP 123/77 09/17/2018  9:46 AM  Temp    Pulse 60 09/17/2018  9:46 AM  Resp 14 09/17/2018  9:46 AM  SpO2 100 % 09/17/2018  9:46 AM  Vitals shown include unvalidated device data.  Last Pain:  Vitals:   09/17/18 0601  TempSrc:   PainSc: 0-No pain      Patients Stated Pain Goal: 1 (09/17/18 0601)  Complications: No apparent anesthesia complications

## 2018-09-17 NOTE — Op Note (Signed)
OPERATIVE REPORT  DATE OF OPERATION: 09/17/2018  PATIENT:  Joe Morton  67 y.o. male  PRE-OPERATIVE DIAGNOSIS:  SYMPTOMATIC LEFT INGUINAL HERNIA  POST-OPERATIVE DIAGNOSIS:  SYMPTOMATIC LEFT INGUINAL HERNIA  INDICATION(S) FOR OPERATION:  Symptomatic left inguinal hernia  FINDINGS:  Large indirect and direct inguinal hernia without incarcerated intraabdominalcontents  PROCEDURE:  Procedure(s): OPEN LEFT INGUINAL HERNIA REPAIR WITH MESH INSERTION OF MESH  SURGEON:  Surgeon(s): Jimmye Norman, MD Hedda Slade, PA-C  ASSISTANT: Iran Sizer, PA-C  ANESTHESIA:   local, general and 10ml of Exparel and TAP block  COMPLICATIONS:  None  EBL: 20 ml  BLOOD ADMINISTERED: none  DRAINS: none   SPECIMEN:  No Specimen  COUNTS CORRECT:  YES  PROCEDURE DETAILS: Open Hernia Operative Report  Indications: The patient presented with a history of a left, reducible hernia.    Pre-operative Diagnosis: left reducible hernia  Post-operative Diagnosis: same  Surgeon: Kathrin Ruddy, MD   Assistants: Bertell Maria  Anesthesia: General LMA anesthesia and Local anesthesia 10 ml Exparel  Procedure Details  The patient was seen again in the Holding Room. The risks, benefits, complications, treatment options, and expected outcomes were discussed with the patient. The possibilities of reaction to medication, pulmonary aspiration, perforation of viscus, bleeding, recurrent infection, the need for additional procedures, and development of a complication requiring transfusion or further operation were discussed with the patient and/or family. There was concurrence with the proposed plan, and informed consent was obtained. The site of surgery was properly noted/marked. The patient was taken to the Operating Room, identified as [name], and the procedure verified as left inguinal hernia repair. A Time Out was held and the above information confirmed.  The patient was placed in the supine position and  underwent induction of anesthesia, the lower abdomen and groin was prepped and draped in the standard fashion. A transverse incision was made. Dissection was carried through the soft tissue to expose the inguinal canal and inguinal ligament along its lower edge. The external oblique fascia was split along the course of its fibers, exposing the inguinal canal. The cord and nerve were looped using a Penrose drain and reflected out of the field.  The large indirect sac was dissected free of the spermatic cord and suture ligated at the base with multiple 0 Ethibond sutures The defect was exposed and a piece of mesh was trimmed to size and placed over the defect. Running 0 Prolene suture was then used in a continuous fashion to repair the defect, with the suture being sewn from the pubic tubercle and reflected border of the inguinal ligament inferiorly and laterally, and then superior and medially to the conjoined tendon to a level just beyond the internal ring.  The contents were then returned to canal and the external oblique fashion was then closed in a continuous fashion using 3-0 Vicryl suture taking care not to cause entrapment.  Scarpa's fascia was reapproximated using 3-0 Vicryl.  The skin was then closed using a running 4-0 Monocryl subcuticular stitch.  Dermabond, Steri-Strips, and Tagaderm were then used to complete the dressing.  Instrument, sponge, and needle counts were correct prior to closure and at the conclusion of the case.  Findings: Hernia as above  Estimated Blood Loss: 20 ml         Drains: None               Specimens: None               Complications: None; patient  tolerated the procedure well.         Disposition: PACU - hemodynamically stable.         Condition: stable    PATIENT DISPOSITION:  PACU - hemodynamically stable.   Jimmye Norman 10/28/20199:27 AM

## 2018-09-17 NOTE — H&P (Signed)
Joe Morton Documented: 07/24/2018 8:52 Morton Location: [facility] Patient #: [id number] DOB: [birth year] Married / Language: [place] / Race: White Male   History of Present Illness Joe Morton. Joe Spruce MD; 07/24/2018 9:20 Morton) The patient is a 67 year old male who presents with an inguinal hernia. The hernia(s) is/are located on the left side. Symptoms include inguinal bulge and inguinal pain (Minimal pain). The pain is located in the left inguinal area. There is no radiation. The patient describes the pain as dull. The patient describes this as worsening (Hernia has gotten larger). Symptoms are exacerbated by straining, lifting and coughing. Symptoms are relieved by recumbency.   Past Surgical History Joe Morton, Morton; 07/24/2018 9:24 Morton) No pertinent past surgical history   Diagnostic Studies History Joe Morton, Morton; 07/24/2018 9:24 Morton) Colonoscopy  within last year  Allergies (Joe Morton, Morton; 07/24/2018 8:53 Morton) No Known Drug Allergies [07/24/2018]: Allergies Reconciled   Medication History (Joe Morton, Morton; 07/24/2018 8:53 Morton) No Current Medications Medications Reconciled  Social History Joe Morton, Morton; 07/24/2018 9:24 Morton) Alcohol use  Moderate alcohol use. Caffeine use  Coffee. Illicit drug use  Remotely quit drug use. Tobacco use  Current every day smoker.  Family History Joe Morton, Morton; 07/24/2018 9:24 Morton) Breast Cancer  Mother. Ischemic Bowel Disease  Daughter. Respiratory Condition  Mother.  Other Problems (Joe Morton, Morton; 07/24/2018 9:24 Morton) Hemorrhoids  Inguinal Hernia, left  Review of Systems (Joe Morton) General Not Present- Appetite Loss, Chills, Fatigue, Fever, Night Sweats, Weight Gain and Weight Loss. Skin Present- Non-Healing Wounds. Not Present- Change in Wart/Mole, Dryness, Hives, Jaundice, New Lesions, Rash and Ulcer. HEENT Not Present- Earache, Hearing Loss, Hoarseness, Nose Bleed, Oral  Ulcers, Ringing in the Ears, Seasonal Allergies, Sinus Pain, Sore Throat, Visual Disturbances, Wears glasses/contact lenses and Yellow Eyes. Respiratory Present- Snoring. Not Present- Bloody sputum, Chronic Cough, Difficulty Breathing and Wheezing. Breast Not Present- Breast Mass, Breast Pain, Nipple Discharge and Skin Changes. Cardiovascular Not Present- Chest Pain, Difficulty Breathing Lying Down, Leg Cramps, Palpitations, Rapid Heart Rate, Shortness of Breath and Swelling of Extremities. Gastrointestinal Present- Bloody Stool. Not Present- Abdominal Pain, Bloating, Change in Bowel Habits, Chronic diarrhea, Constipation, Difficulty Swallowing, Excessive gas, Gets full quickly at meals, Hemorrhoids, Indigestion, Nausea, Rectal Pain and Vomiting. Male Genitourinary Not Present- Blood in Urine, Change in Urinary Stream, Frequency, Impotence, Nocturia, Painful Urination, Urgency and Urine Leakage. Musculoskeletal Not Present- Back Pain, Joint Pain, Joint Stiffness, Muscle Pain, Muscle Weakness and Swelling of Extremities. Neurological Not Present- Decreased Memory, Fainting, Headaches, Numbness, Seizures, Tingling, Tremor, Trouble walking and Weakness. Psychiatric Not Present- Anxiety, Bipolar, Change in Sleep Pattern, Depression, Fearful and Frequent crying. Endocrine Not Present- Cold Intolerance, Excessive Hunger, Hair Changes, Heat Intolerance, Hot flashes and New Diabetes. Hematology Not Present- Blood Thinners, Easy Bruising, Excessive bleeding, Gland problems, HIV and Persistent Infections.  Vitals (Joe Morton; 07/24/2018 8:53 Morton) 07/24/2018 8:52 Morton Weight: 148.8 lb Height: 68in Body Surface Area: 1.8 m Body Mass Index: 22.62 kg/m  Temp.: 98.9F  Pulse: 69 (Regular)  BP: 126/82 (Sitting, Left Arm, Standard) BP today 133/90 P 74   Physical Exam (Joe Briski O. Joe Spruce MD; 07/24/2018 9:22 Morton) General Mental Status-Alert. General Appearance-Cooperative, Well groomed and  Consistent with stated age. Orientation-Oriented X4. Build & Nutrition-Lean.  Chest and Lung Exam Chest and lung exam reveals -quiet, even and easy respiratory effort with no use of accessory muscles and on auscultation, normal breath sounds, no adventitious sounds and  normal vocal resonance.  Cardiovascular Cardiovascular examination reveals -normal heart sounds, regular rate and rhythm with no murmurs, (see Vital Signs section for blood pressure measurements) and femoral artery auscultation bilaterally reveals normal pulses, no bruits, no thrills.  Abdomen Inspection Hernias - Femoral hernia - Left - Reducible(Large direct. No palpable hernia on the right.).    Assessment & Plan Joe Fearing O. Joe Wehrman MD; 07/24/2018 9:24 Morton) LEFT INGUINAL HERNIA (K40.90) Story: Misdiagnosed for a couple of years because he had and insect bite in the area Impression: Easily reducible direct LIH. Minimally symptomatic. Non plapable on the right. Will schedule for open reapir with mesh.  Patient getting tap block. Plan open [facility] repair with mesh.  Joe Morton [phone] [facility]

## 2018-09-18 ENCOUNTER — Encounter (HOSPITAL_COMMUNITY): Payer: Self-pay | Admitting: General Surgery

## 2018-09-21 ENCOUNTER — Other Ambulatory Visit: Payer: Self-pay | Admitting: Geriatric Medicine

## 2018-09-21 DIAGNOSIS — R911 Solitary pulmonary nodule: Secondary | ICD-10-CM

## 2018-09-28 ENCOUNTER — Ambulatory Visit
Admission: RE | Admit: 2018-09-28 | Discharge: 2018-09-28 | Disposition: A | Discharge status: Home / Self Care | Payer: Medicare Other | Source: Ambulatory Visit | Attending: Geriatric Medicine | Admitting: Geriatric Medicine

## 2018-09-28 DIAGNOSIS — R911 Solitary pulmonary nodule: Secondary | ICD-10-CM

## 2018-09-28 MED ORDER — IOPAMIDOL (ISOVUE-300) INJECTION 61%
75.0000 mL | Freq: Once | INTRAVENOUS | Status: AC | PRN
  Administered 2018-09-28: 75 mL via INTRAVENOUS

## 2019-10-02 ENCOUNTER — Other Ambulatory Visit: Payer: Self-pay | Admitting: Geriatric Medicine

## 2019-10-02 DIAGNOSIS — I712 Thoracic aortic aneurysm, without rupture, unspecified: Secondary | ICD-10-CM

## 2019-10-11 ENCOUNTER — Other Ambulatory Visit: Payer: Medicare Other

## 2020-06-17 IMAGING — CR DG CHEST 2V
2 series · 2 of 2 positions shown · non-contrast
Comparison: None

CLINICAL DATA: Preoperative evaluation for hernia surgery, smoker

EXAM:
CHEST - 2 VIEW

[w chest pa]
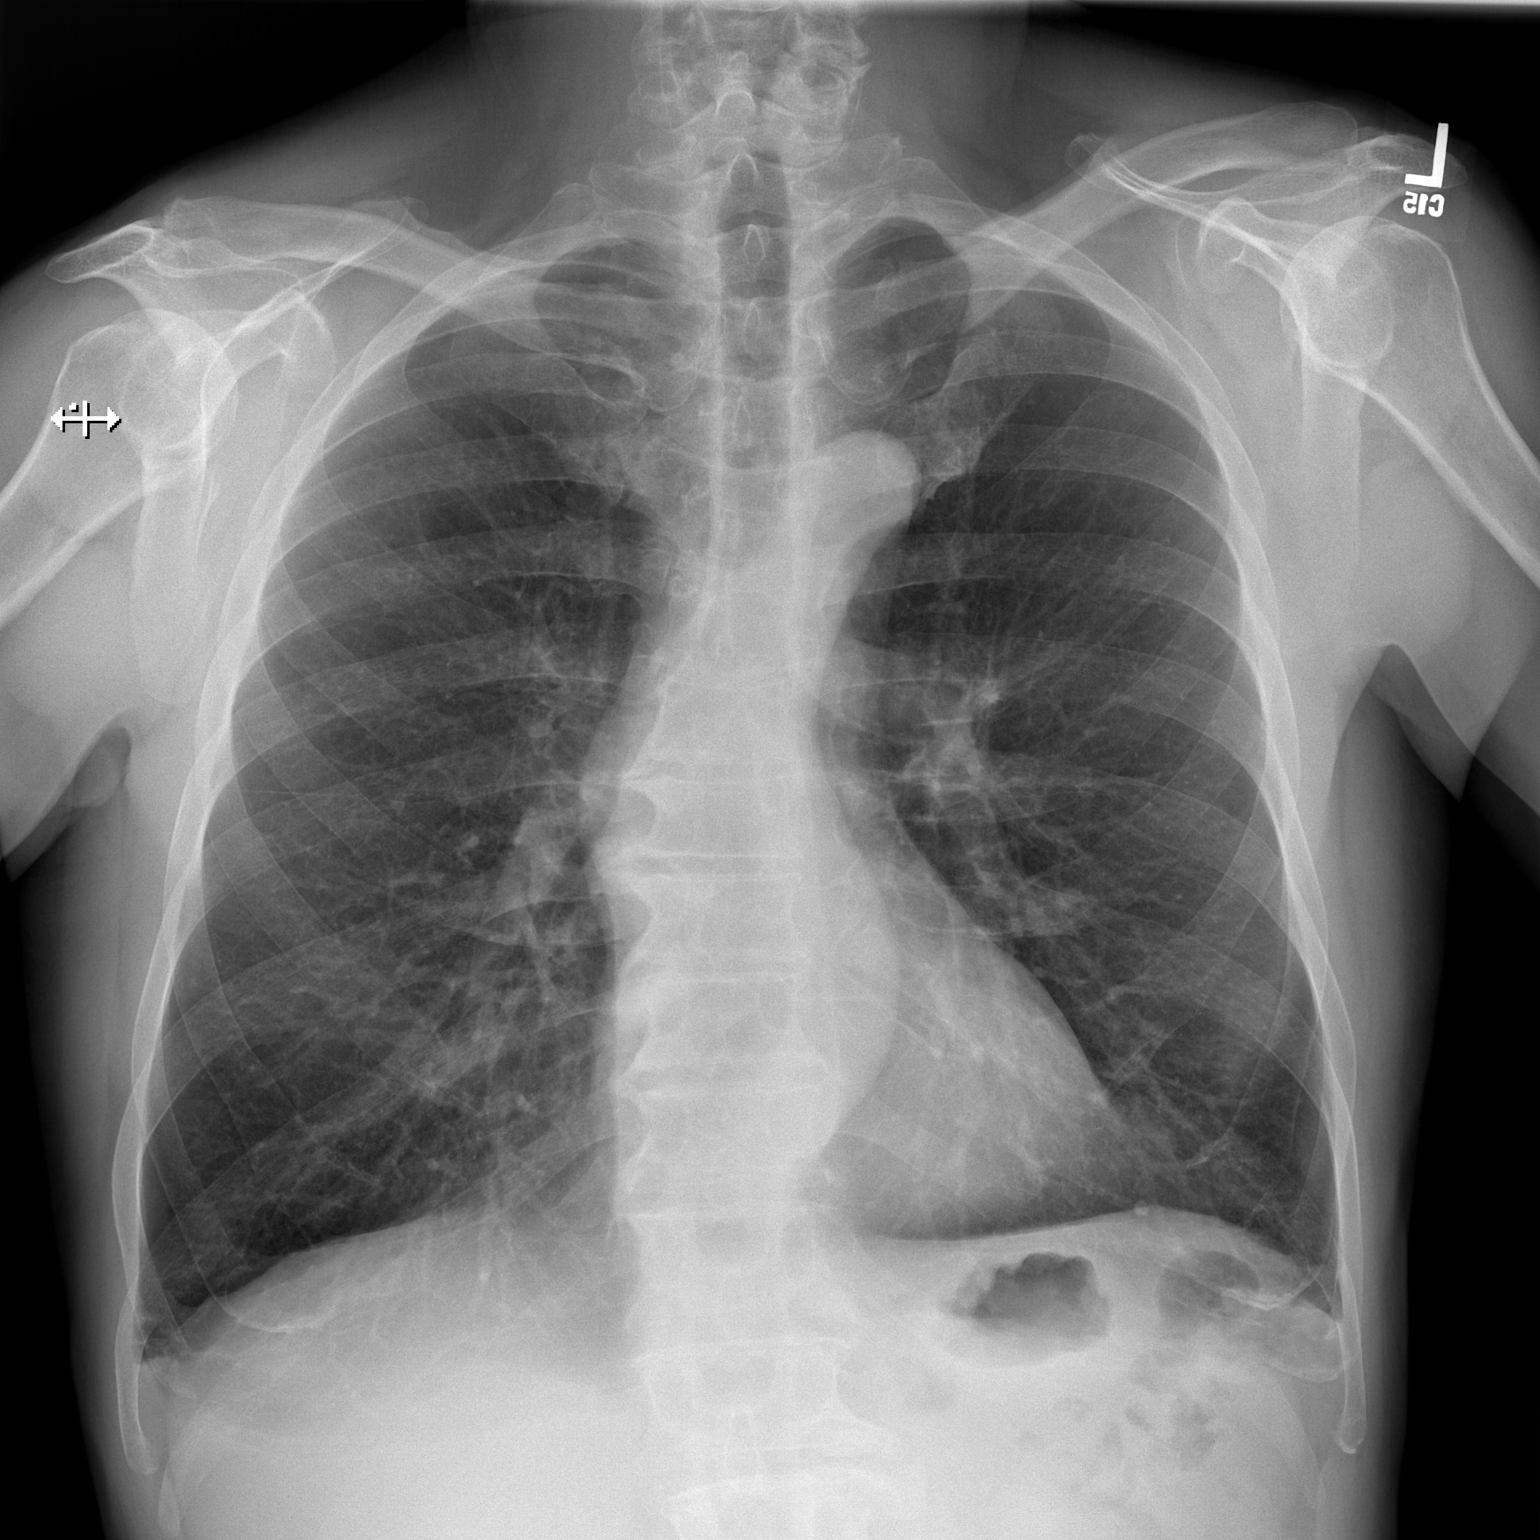

[w chest lat]
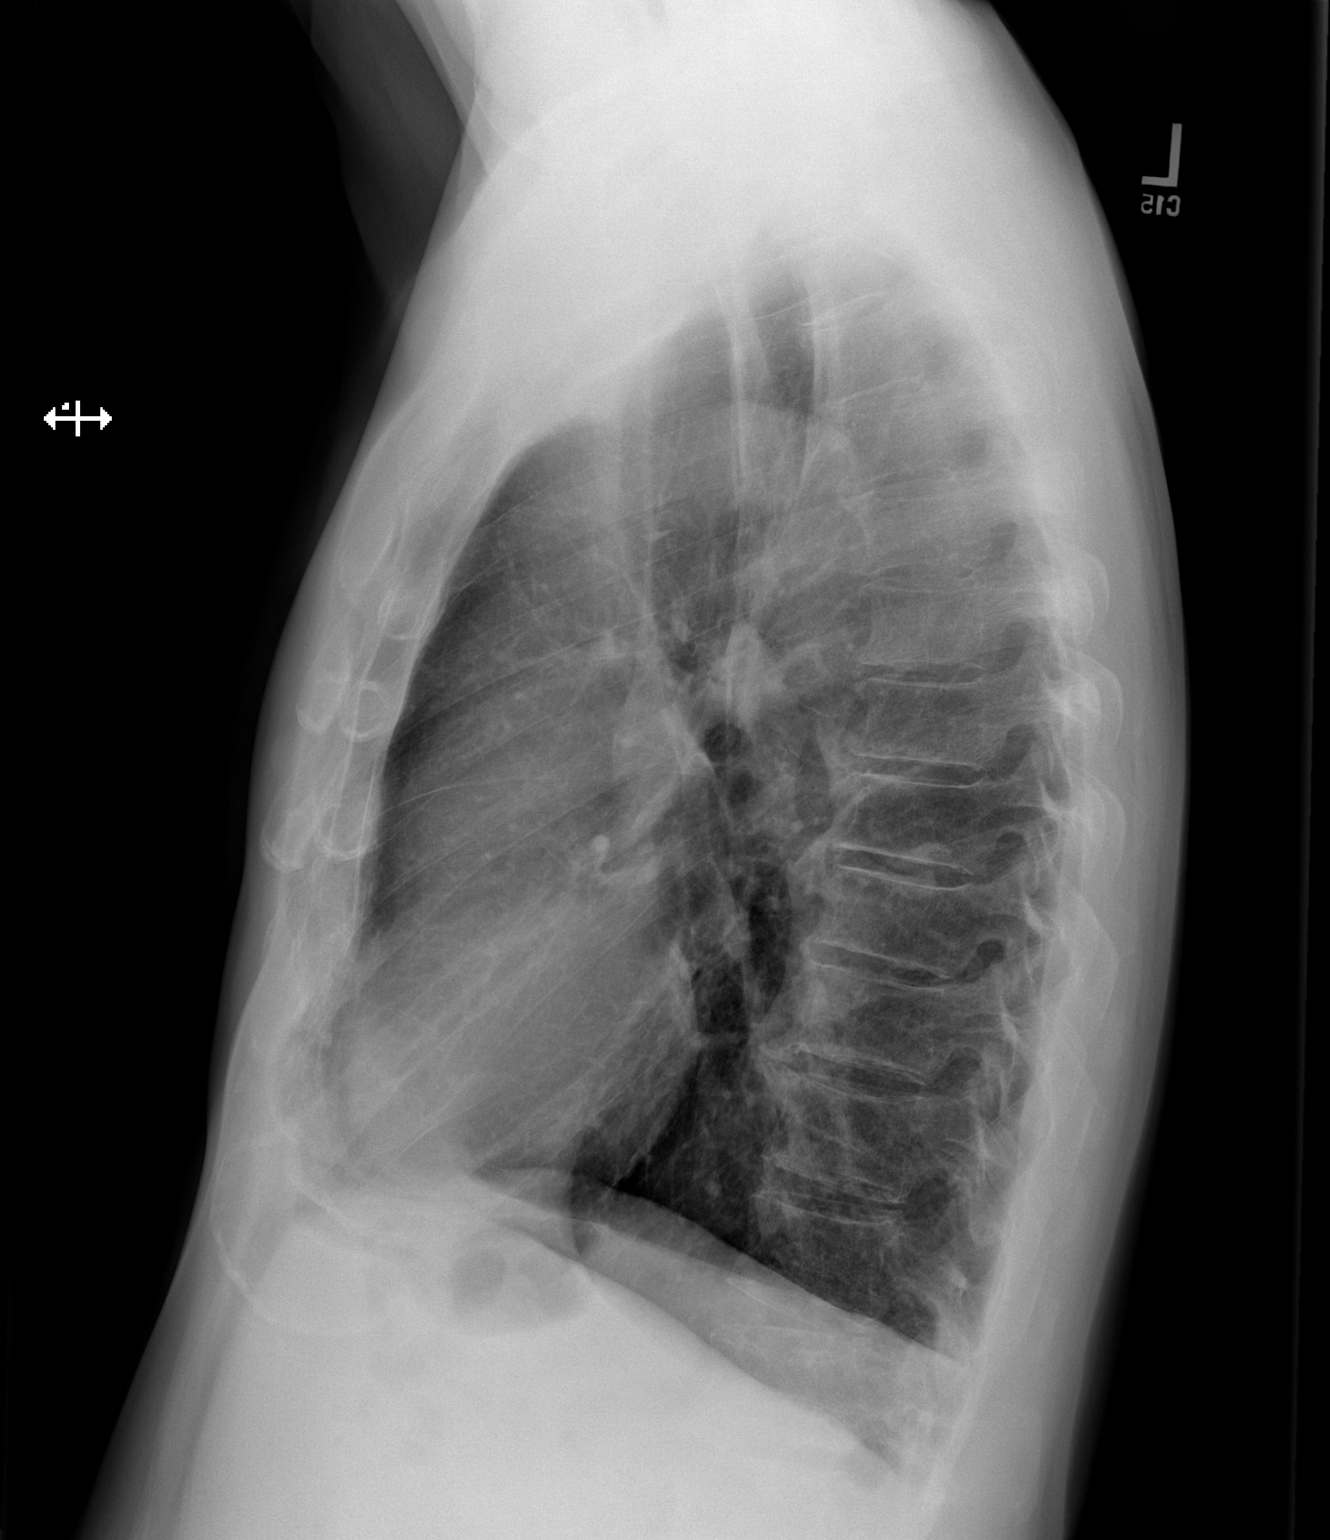

[2 of 2 positions shown; findings below may reference images not displayed]

FINDINGS: Normal heart size, mediastinal contours, and pulmonary vascularity.

Tortuosity of thoracic aorta.

Lungs hyperinflated.

No pulmonary infiltrate, pleural effusion or pneumothorax.

Vague asymmetric density approximately 2 cm diameter at lateral LEFT
lung base, could represent asymmetric superimposed soft tissue but
vague pulmonary nodule not excluded.

Scattered degenerative disc disease changes thoracic spine.
IMPRESSION: Hyperinflated lungs with a vague 2 cm diameter area of asymmetric
density at the lateral LEFT lung base, question artifact versus
pulmonary nodule; CT chest recommended to exclude nodule/tumor.

These results will be called to the ordering clinician or
representative by the Radiologist Assistant, and communication
documented in the PACS or zVision Dashboard.

## 2021-01-11 DIAGNOSIS — H524 Presbyopia: Secondary | ICD-10-CM | POA: Diagnosis not present

## 2021-01-11 DIAGNOSIS — H0288B Meibomian gland dysfunction left eye, upper and lower eyelids: Secondary | ICD-10-CM | POA: Diagnosis not present

## 2021-01-11 DIAGNOSIS — H0288A Meibomian gland dysfunction right eye, upper and lower eyelids: Secondary | ICD-10-CM | POA: Diagnosis not present

## 2022-03-29 DIAGNOSIS — W57XXXA Bitten or stung by nonvenomous insect and other nonvenomous arthropods, initial encounter: Secondary | ICD-10-CM | POA: Diagnosis not present

## 2022-03-29 DIAGNOSIS — L089 Local infection of the skin and subcutaneous tissue, unspecified: Secondary | ICD-10-CM | POA: Diagnosis not present

## 2022-03-29 DIAGNOSIS — L03115 Cellulitis of right lower limb: Secondary | ICD-10-CM | POA: Diagnosis not present

## 2022-03-29 DIAGNOSIS — S70361A Insect bite (nonvenomous), right thigh, initial encounter: Secondary | ICD-10-CM | POA: Diagnosis not present

## 2023-07-20 DIAGNOSIS — H524 Presbyopia: Secondary | ICD-10-CM | POA: Diagnosis not present

## 2023-07-20 DIAGNOSIS — H2513 Age-related nuclear cataract, bilateral: Secondary | ICD-10-CM | POA: Diagnosis not present

## 2023-07-20 DIAGNOSIS — H5213 Myopia, bilateral: Secondary | ICD-10-CM | POA: Diagnosis not present

## 2023-09-05 DIAGNOSIS — J439 Emphysema, unspecified: Secondary | ICD-10-CM | POA: Diagnosis not present

## 2023-09-05 DIAGNOSIS — I700 Atherosclerosis of aorta: Secondary | ICD-10-CM | POA: Diagnosis not present

## 2023-09-05 DIAGNOSIS — R030 Elevated blood-pressure reading, without diagnosis of hypertension: Secondary | ICD-10-CM | POA: Diagnosis not present

## 2023-09-05 DIAGNOSIS — Z23 Encounter for immunization: Secondary | ICD-10-CM | POA: Diagnosis not present

## 2023-09-05 DIAGNOSIS — I712 Thoracic aortic aneurysm, without rupture, unspecified: Secondary | ICD-10-CM | POA: Diagnosis not present

## 2023-10-23 DIAGNOSIS — I700 Atherosclerosis of aorta: Secondary | ICD-10-CM | POA: Diagnosis not present

## 2023-10-23 DIAGNOSIS — J439 Emphysema, unspecified: Secondary | ICD-10-CM | POA: Diagnosis not present

## 2023-10-23 DIAGNOSIS — F17210 Nicotine dependence, cigarettes, uncomplicated: Secondary | ICD-10-CM | POA: Diagnosis not present

## 2023-10-23 DIAGNOSIS — Z720 Tobacco use: Secondary | ICD-10-CM | POA: Diagnosis not present

## 2023-10-23 DIAGNOSIS — Z23 Encounter for immunization: Secondary | ICD-10-CM | POA: Diagnosis not present

## 2023-10-23 DIAGNOSIS — I712 Thoracic aortic aneurysm, without rupture, unspecified: Secondary | ICD-10-CM | POA: Diagnosis not present

## 2023-10-23 DIAGNOSIS — R030 Elevated blood-pressure reading, without diagnosis of hypertension: Secondary | ICD-10-CM | POA: Diagnosis not present

## 2023-10-23 DIAGNOSIS — E785 Hyperlipidemia, unspecified: Secondary | ICD-10-CM | POA: Diagnosis not present

## 2023-10-23 DIAGNOSIS — Z1211 Encounter for screening for malignant neoplasm of colon: Secondary | ICD-10-CM | POA: Diagnosis not present

## 2023-10-24 ENCOUNTER — Other Ambulatory Visit: Payer: Self-pay | Admitting: Internal Medicine

## 2023-10-24 DIAGNOSIS — I712 Thoracic aortic aneurysm, without rupture, unspecified: Secondary | ICD-10-CM

## 2023-11-07 ENCOUNTER — Ambulatory Visit
Admission: RE | Admit: 2023-11-07 | Discharge: 2023-11-07 | Disposition: A | Discharge status: Home / Self Care | Payer: Medicare Other | Source: Ambulatory Visit | Attending: Internal Medicine | Admitting: Internal Medicine

## 2023-11-07 DIAGNOSIS — I7121 Aneurysm of the ascending aorta, without rupture: Secondary | ICD-10-CM | POA: Diagnosis not present

## 2023-11-07 DIAGNOSIS — R918 Other nonspecific abnormal finding of lung field: Secondary | ICD-10-CM | POA: Diagnosis not present

## 2023-11-07 DIAGNOSIS — I700 Atherosclerosis of aorta: Secondary | ICD-10-CM | POA: Diagnosis not present

## 2023-11-07 DIAGNOSIS — J439 Emphysema, unspecified: Secondary | ICD-10-CM | POA: Diagnosis not present

## 2023-11-07 DIAGNOSIS — I712 Thoracic aortic aneurysm, without rupture, unspecified: Secondary | ICD-10-CM

## 2023-11-07 MED ORDER — IOPAMIDOL (ISOVUE-370) INJECTION 76%
75.0000 mL | Freq: Once | INTRAVENOUS | Status: AC | PRN
  Administered 2023-11-07: 75 mL via INTRAVENOUS

## 2023-11-29 ENCOUNTER — Ambulatory Visit: Payer: Medicare Other | Admitting: Pulmonary Disease

## 2023-11-29 ENCOUNTER — Encounter: Payer: Self-pay | Admitting: Pulmonary Disease

## 2023-11-29 VITALS — BP 118/78 | HR 79 | Temp 97.6°F | Ht 68.0 in | Wt 161.4 lb

## 2023-11-29 DIAGNOSIS — C3412 Malignant neoplasm of upper lobe, left bronchus or lung: Secondary | ICD-10-CM

## 2023-11-29 NOTE — H&P (View-Only) (Signed)
Synopsis: Referred in by Emilio Aspen, *   Subjective:   PATIENT ID: Joe Morton GENDER: male DOB: November 23, 1950, MRN: [id number]  Chief Complaint  Patient presents with   Consult    Cough with clear phlegm. No shortness of breath or wheezing.     HPI Joe Morton is a 73 year old male patient with a past medical history of heavy tobacco use and hypertension presenting today to the pulmonary clinic for a left upper lobe nodule.  He has been following with serial CTA of the chest for an aortic aneurysm and was found to have a left upper lobe 2 cm cavitary lung lesion.  Therefore he was referred to [place] for further evaluation.  He has been having a wet cough for the past 6 to 8 months.  He denies any shortness of breath chest tightness or wheezing.  Denies any hemoptysis.  He does have a good appetite and no significant weight loss.  CT chest 11/07/2023 -2 cm cavitary lung lesion in the left upper lobe posteriorly located.  Spiculations are noted.  Family history -mother with emphysema was a heavy smoker.  She also had breast cancer.  Social history -smoked 1 pack/day for 40 years.  Drinks 1-2 beers daily.  ROS All systems were reviewed and are negative except for the above Objective:   Vitals:   11/29/23 0916  BP: 118/78  Pulse: 79  Temp: 97.6 F (36.4 C)  TempSrc: Temporal  SpO2: 96%  Weight: 161 lb 6.4 oz (73.2 kg)  Height: 5\' 8"  (1.727 m)   96% on RA BMI Readings from Last 3 Encounters:  11/29/23 24.54 kg/m  09/17/18 23.26 kg/m  09/10/18 23.96 kg/m   Wt Readings from Last 3 Encounters:  11/29/23 161 lb 6.4 oz (73.2 kg)  09/17/18 153 lb (69.4 kg)  09/10/18 153 lb (69.4 kg)    Physical Exam GEN: NAD, Healthy Appearing HEENT: Supple Neck, Reactive Pupils, EOMI  CVS: Normal S1, Normal S2, RRR, No murmurs or ES appreciated  Lungs: Clear bilateral air entry.  Abdomen: Soft, non tender, non distended, + BS  Extremities: Warm and well perfused, No edema   Skin: No suspicious lesions appreciated  Psych: Normal Affect  Ancillary Information   CBC    Component Value Date/Time   WBC 5.9 09/10/2018 0807   RBC 5.69 09/10/2018 0807   HGB 18.3 (H) 09/10/2018 0807   HCT 56.8 (H) 09/10/2018 0807   PLT 221 09/10/2018 0807   MCV 99.8 09/10/2018 0807   MCH 32.2 09/10/2018 0807   MCHC 32.2 09/10/2018 0807   RDW 14.1 09/10/2018 0807   LYMPHSABS 1.5 09/10/2018 0807   MONOABS 0.9 09/10/2018 0807   EOSABS 0.2 09/10/2018 0807   BASOSABS 0.1 09/10/2018 0807   Labs and imaging were reviewed     No data to display           Assessment & Plan:  Joe Morton is a 73 year old male patient with a past medical history of heavy tobacco use and hypertension presenting today to the pulmonary clinic for a left upper lobe nodule.  # Left upper lobe 2 cm nodule [facility] - 74.5% (upper lobe, spiculated, active smoker) ECOG - 0   []  PET scan and CT super D next week.  []  Discussed RANB pending PET scan and CT chest and patient agreeable.  []  PFTs   Return in about 3 months (around 02/27/2024).  I spent 60 minutes caring for this patient today, including preparing to see the  patient, obtaining a medical history , reviewing a separately obtained history, performing a medically appropriate examination and/or evaluation, counseling and educating the patient/family/caregiver, ordering medications, tests, or procedures, documenting clinical information in the electronic health record, and independently interpreting results (not separately reported/billed) and communicating results to the patient/family/caregiver  [name], MD [facility] 11/29/2023 10:05 AM

## 2023-11-29 NOTE — Progress Notes (Signed)
 Synopsis: Referred in by Emilio Aspen, *   Subjective:   PATIENT ID: Joe Morton GENDER: male DOB: November 23, 1950, MRN: [id number]  Chief Complaint  Patient presents with   Consult    Cough with clear phlegm. No shortness of breath or wheezing.     HPI Joe Morton is a 73 year old male patient with a past medical history of heavy tobacco use and hypertension presenting today to the pulmonary clinic for a left upper lobe nodule.  He has been following with serial CTA of the chest for an aortic aneurysm and was found to have a left upper lobe 2 cm cavitary lung lesion.  Therefore he was referred to [place] for further evaluation.  He has been having a wet cough for the past 6 to 8 months.  He denies any shortness of breath chest tightness or wheezing.  Denies any hemoptysis.  He does have a good appetite and no significant weight loss.  CT chest 11/07/2023 -2 cm cavitary lung lesion in the left upper lobe posteriorly located.  Spiculations are noted.  Family history -mother with emphysema was a heavy smoker.  She also had breast cancer.  Social history -smoked 1 pack/day for 40 years.  Drinks 1-2 beers daily.  ROS All systems were reviewed and are negative except for the above Objective:   Vitals:   11/29/23 0916  BP: 118/78  Pulse: 79  Temp: 97.6 F (36.4 C)  TempSrc: Temporal  SpO2: 96%  Weight: 161 lb 6.4 oz (73.2 kg)  Height: 5\' 8"  (1.727 m)   96% on RA BMI Readings from Last 3 Encounters:  11/29/23 24.54 kg/m  09/17/18 23.26 kg/m  09/10/18 23.96 kg/m   Wt Readings from Last 3 Encounters:  11/29/23 161 lb 6.4 oz (73.2 kg)  09/17/18 153 lb (69.4 kg)  09/10/18 153 lb (69.4 kg)    Physical Exam GEN: NAD, Healthy Appearing HEENT: Supple Neck, Reactive Pupils, EOMI  CVS: Normal S1, Normal S2, RRR, No murmurs or ES appreciated  Lungs: Clear bilateral air entry.  Abdomen: Soft, non tender, non distended, + BS  Extremities: Warm and well perfused, No edema   Skin: No suspicious lesions appreciated  Psych: Normal Affect  Ancillary Information   CBC    Component Value Date/Time   WBC 5.9 09/10/2018 0807   RBC 5.69 09/10/2018 0807   HGB 18.3 (H) 09/10/2018 0807   HCT 56.8 (H) 09/10/2018 0807   PLT 221 09/10/2018 0807   MCV 99.8 09/10/2018 0807   MCH 32.2 09/10/2018 0807   MCHC 32.2 09/10/2018 0807   RDW 14.1 09/10/2018 0807   LYMPHSABS 1.5 09/10/2018 0807   MONOABS 0.9 09/10/2018 0807   EOSABS 0.2 09/10/2018 0807   BASOSABS 0.1 09/10/2018 0807   Labs and imaging were reviewed     No data to display           Assessment & Plan:  Joe Morton is a 73 year old male patient with a past medical history of heavy tobacco use and hypertension presenting today to the pulmonary clinic for a left upper lobe nodule.  # Left upper lobe 2 cm nodule [facility] - 74.5% (upper lobe, spiculated, active smoker) ECOG - 0   []  PET scan and CT super D next week.  []  Discussed RANB pending PET scan and CT chest and patient agreeable.  []  PFTs   Return in about 3 months (around 02/27/2024).  I spent 60 minutes caring for this patient today, including preparing to see the  patient, obtaining a medical history , reviewing a separately obtained history, performing a medically appropriate examination and/or evaluation, counseling and educating the patient/family/caregiver, ordering medications, tests, or procedures, documenting clinical information in the electronic health record, and independently interpreting results (not separately reported/billed) and communicating results to the patient/family/caregiver  [name], MD [facility] 11/29/2023 10:05 AM

## 2023-12-01 NOTE — Addendum Note (Signed)
 Addended by: [name] on: 12/01/2023 12:39 PM   Modules accepted: Orders

## 2023-12-06 ENCOUNTER — Encounter: Payer: Self-pay | Admitting: Pulmonary Disease

## 2023-12-08 ENCOUNTER — Ambulatory Visit
Admission: RE | Admit: 2023-12-08 | Discharge: 2023-12-08 | Disposition: A | Discharge status: Home / Self Care | Payer: Medicare Other | Source: Ambulatory Visit | Attending: Pulmonary Disease

## 2023-12-08 ENCOUNTER — Ambulatory Visit
Admission: RE | Admit: 2023-12-08 | Discharge: 2023-12-08 | Disposition: A | Discharge status: Home / Self Care | Payer: Medicare Other | Source: Ambulatory Visit | Attending: Pulmonary Disease | Admitting: Pulmonary Disease

## 2023-12-08 DIAGNOSIS — C3412 Malignant neoplasm of upper lobe, left bronchus or lung: Secondary | ICD-10-CM

## 2023-12-08 DIAGNOSIS — J90 Pleural effusion, not elsewhere classified: Secondary | ICD-10-CM | POA: Diagnosis not present

## 2023-12-08 DIAGNOSIS — R918 Other nonspecific abnormal finding of lung field: Secondary | ICD-10-CM | POA: Diagnosis not present

## 2023-12-08 DIAGNOSIS — J432 Centrilobular emphysema: Secondary | ICD-10-CM | POA: Diagnosis not present

## 2023-12-08 LAB — GLUCOSE, CAPILLARY: Glucose-Capillary: 97 mg/dL (ref 70–99)

## 2023-12-08 MED ORDER — FLUDEOXYGLUCOSE F - 18 (FDG) INJECTION
8.3000 | Freq: Once | INTRAVENOUS | Status: AC | PRN
  Administered 2023-12-08: 8.61 via INTRAVENOUS

## 2023-12-18 ENCOUNTER — Telehealth: Payer: Self-pay | Admitting: Pulmonary Disease

## 2023-12-18 NOTE — Telephone Encounter (Signed)
Patient states that he will be in attendance for procedure this Thursday.

## 2023-12-18 NOTE — Telephone Encounter (Signed)
I spoke with the patient and confirmed the day and time with him. He is aware.  Nothing further needed.

## 2023-12-20 ENCOUNTER — Other Ambulatory Visit: Payer: Self-pay

## 2023-12-20 ENCOUNTER — Encounter (HOSPITAL_COMMUNITY): Payer: Self-pay | Admitting: Pulmonary Disease

## 2023-12-20 NOTE — Progress Notes (Signed)
PCP - Emilio Aspen, MD  Cardiologist -   PPM/ICD - denies Device Orders - n/a Rep Notified - n/a  Chest x-ray -  Chest CT 12-16-23 EKG - DOS Stress Test -  ECHO -  Cardiac Cath -   CPAP - Denies  Dm -denies  Blood Thinner Instructions: denies Aspirin Instructions: n/a  ERAS Protcol - NPO  COVID TEST- n/a  Anesthesia review: no  Patient verbally denies any shortness of breath, fever, cough and chest pain during phone call   -------------  SDW INSTRUCTIONS given:  Your procedure is scheduled on December 21, 2023.  Report to [facility] Main Entrance "A" at 5:30 A.M., and check in at the Admitting office.  Call this number if you have problems the morning of surgery:  [phone]   Remember:  Do not eat or drink after midnight the night before your surgery   Take these medicines the morning of surgery with A SIP OF WATER  amLODipine (NORVASC)   As of today, STOP taking any Aspirin (unless otherwise instructed by your surgeon) Aleve, Naproxen, Ibuprofen, Motrin, Advil, Goody's, BC's, all herbal medications, fish oil, and all vitamins.                      Do not wear jewelry, make up, or nail polish            Do not wear lotions, powders, perfumes/colognes, or deodorant.            Do not shave 48 hours prior to surgery.  Men may shave face and neck.            Do not bring valuables to the hospital.            [facility] is not responsible for any belongings or valuables.  Do NOT Smoke (Tobacco/Vaping) 24 hours prior to your procedure If you use a CPAP at night, you may bring all equipment for your overnight stay.   Contacts, glasses, dentures or bridgework may not be worn into surgery.      For patients admitted to the hospital, discharge time will be determined by your treatment team.   Patients discharged the day of surgery will not be allowed to drive home, and someone needs to stay with them for 24 hours.    Special instructions:   [facility]- Preparing For Surgery  Before surgery, you can play an important role. Because skin is not sterile, your skin needs to be as free of germs as possible. You can reduce the number of germs on your skin by washing with CHG (chlorahexidine gluconate) Soap before surgery.  CHG is an antiseptic cleaner which kills germs and bonds with the skin to continue killing germs even after washing.    Oral Hygiene is also important to reduce your risk of infection.  Remember - BRUSH YOUR TEETH THE MORNING OF SURGERY WITH YOUR REGULAR TOOTHPASTE  Please do not use if you have an allergy to CHG or antibacterial soaps. If your skin becomes reddened/irritated stop using the CHG.  Do not shave (including legs and underarms) for at least 48 hours prior to first CHG shower. It is OK to shave your face.  Please follow these instructions carefully.   Shower the NIGHT BEFORE SURGERY and the MORNING OF SURGERY with DIAL Soap.   Pat yourself dry with a CLEAN TOWEL.  Wear CLEAN PAJAMAS to bed the night before surgery  Place CLEAN SHEETS on your bed  the night of your first shower and DO NOT SLEEP WITH PETS.   Day of Surgery: Please shower morning of surgery  Wear Clean/Comfortable clothing the morning of surgery Do not apply any deodorants/lotions.   Remember to brush your teeth WITH YOUR REGULAR TOOTHPASTE.   Questions were answered. Patient verbalized understanding of instructions.

## 2023-12-20 NOTE — Anesthesia Preprocedure Evaluation (Signed)
Anesthesia Evaluation  Patient identified by MRN, date of birth, ID band Patient awake    Reviewed: Allergy & Precautions, NPO status , Patient's Chart, lab work & pertinent test results  Airway Mallampati: III  TM Distance: >3 FB Neck ROM: Full    Dental no notable dental hx. (+) Teeth Intact, Dental Advisory Given   Pulmonary COPD, Current Smoker and Patient abstained from smoking.   Pulmonary exam normal breath sounds clear to auscultation       Cardiovascular hypertension, Pt. on medications Normal cardiovascular exam Rhythm:Regular Rate:Normal     Neuro/Psych negative neurological ROS  negative psych ROS   GI/Hepatic negative GI ROS, Neg liver ROS,,,  Endo/Other  negative endocrine ROS    Renal/GU negative Renal ROS  negative genitourinary   Musculoskeletal negative musculoskeletal ROS (+)    Abdominal   Peds  Hematology negative hematology ROS (+)   Anesthesia Other Findings   Reproductive/Obstetrics                             Anesthesia Physical Anesthesia Plan  ASA: 2  Anesthesia Plan: General   Post-op Pain Management: Minimal or no pain anticipated   Induction: Intravenous  PONV Risk Score and Plan: 1 and Dexamethasone, Ondansetron and Treatment may vary due to age or medical condition  Airway Management Planned: Oral ETT  Additional Equipment:   Intra-op Plan:   Post-operative Plan: Extubation in OR  Informed Consent: I have reviewed the patients History and Physical, chart, labs and discussed the procedure including the risks, benefits and alternatives for the proposed anesthesia with the patient or authorized representative who has indicated his/her understanding and acceptance.     Dental advisory given  Plan Discussed with: CRNA  Anesthesia Plan Comments:        Anesthesia Quick Evaluation

## 2023-12-21 ENCOUNTER — Ambulatory Visit (HOSPITAL_COMMUNITY): Payer: Medicare Other

## 2023-12-21 ENCOUNTER — Ambulatory Visit (HOSPITAL_COMMUNITY): Admission: RE | Admit: 2023-12-21 | Payer: Medicare Other | Source: Home / Self Care | Admitting: Pulmonary Disease

## 2023-12-21 ENCOUNTER — Ambulatory Visit (HOSPITAL_COMMUNITY)
Admission: RE | Admit: 2023-12-21 | Discharge: 2023-12-21 | Disposition: A | Discharge status: Home / Self Care | Payer: Medicare Other | Attending: Pulmonary Disease | Admitting: Pulmonary Disease

## 2023-12-21 ENCOUNTER — Encounter (HOSPITAL_COMMUNITY)
Admission: RE | Disposition: A | Discharge status: Home / Self Care | Payer: Self-pay | Source: Home / Self Care | Attending: Pulmonary Disease

## 2023-12-21 ENCOUNTER — Ambulatory Visit (HOSPITAL_COMMUNITY): Payer: Medicare Other | Admitting: Anesthesiology

## 2023-12-21 ENCOUNTER — Ambulatory Visit (HOSPITAL_BASED_OUTPATIENT_CLINIC_OR_DEPARTMENT_OTHER): Payer: Medicare Other | Admitting: Anesthesiology

## 2023-12-21 ENCOUNTER — Encounter (HOSPITAL_COMMUNITY): Admission: RE | Payer: Self-pay | Source: Home / Self Care

## 2023-12-21 ENCOUNTER — Encounter (HOSPITAL_COMMUNITY): Payer: Self-pay | Admitting: Pulmonary Disease

## 2023-12-21 DIAGNOSIS — R911 Solitary pulmonary nodule: Secondary | ICD-10-CM | POA: Diagnosis present

## 2023-12-21 DIAGNOSIS — C3412 Malignant neoplasm of upper lobe, left bronchus or lung: Secondary | ICD-10-CM | POA: Diagnosis not present

## 2023-12-21 DIAGNOSIS — I10 Essential (primary) hypertension: Secondary | ICD-10-CM

## 2023-12-21 DIAGNOSIS — J449 Chronic obstructive pulmonary disease, unspecified: Secondary | ICD-10-CM | POA: Diagnosis not present

## 2023-12-21 DIAGNOSIS — C771 Secondary and unspecified malignant neoplasm of intrathoracic lymph nodes: Secondary | ICD-10-CM | POA: Diagnosis not present

## 2023-12-21 DIAGNOSIS — Z825 Family history of asthma and other chronic lower respiratory diseases: Secondary | ICD-10-CM | POA: Insufficient documentation

## 2023-12-21 DIAGNOSIS — Z79899 Other long term (current) drug therapy: Secondary | ICD-10-CM | POA: Diagnosis not present

## 2023-12-21 DIAGNOSIS — C3490 Malignant neoplasm of unspecified part of unspecified bronchus or lung: Secondary | ICD-10-CM | POA: Diagnosis not present

## 2023-12-21 DIAGNOSIS — F17200 Nicotine dependence, unspecified, uncomplicated: Secondary | ICD-10-CM | POA: Insufficient documentation

## 2023-12-21 DIAGNOSIS — Z48813 Encounter for surgical aftercare following surgery on the respiratory system: Secondary | ICD-10-CM | POA: Diagnosis not present

## 2023-12-21 DIAGNOSIS — R918 Other nonspecific abnormal finding of lung field: Secondary | ICD-10-CM | POA: Diagnosis not present

## 2023-12-21 HISTORY — DX: Emphysema, unspecified: J43.9

## 2023-12-21 HISTORY — PX: BRONCHIAL WASHINGS: SHX5105

## 2023-12-21 HISTORY — DX: Essential (primary) hypertension: I10

## 2023-12-21 HISTORY — PX: BRONCHIAL NEEDLE ASPIRATION BIOPSY: SHX5106

## 2023-12-21 HISTORY — PX: FINE NEEDLE ASPIRATION: SHX5430

## 2023-12-21 HISTORY — PX: BRONCHIAL BIOPSY: SHX5109

## 2023-12-21 HISTORY — PX: ENDOBRONCHIAL ULTRASOUND: SHX5096

## 2023-12-21 LAB — CBC
HCT: 46.9 % (ref 39.0–52.0)
Hemoglobin: 16.5 g/dL (ref 13.0–17.0)
MCH: 32.5 pg (ref 26.0–34.0)
MCHC: 35.2 g/dL (ref 30.0–36.0)
MCV: 92.3 fL (ref 80.0–100.0)
Platelets: 175 10*3/uL (ref 150–400)
RBC: 5.08 MIL/uL (ref 4.22–5.81)
RDW: 12.8 % (ref 11.5–15.5)
WBC: 9.4 10*3/uL (ref 4.0–10.5)
nRBC: 0 % (ref 0.0–0.2)

## 2023-12-21 LAB — BASIC METABOLIC PANEL
Anion gap: 12 (ref 5–15)
BUN: 8 mg/dL (ref 8–23)
CO2: 24 mmol/L (ref 22–32)
Calcium: 9.6 mg/dL (ref 8.9–10.3)
Chloride: 95 mmol/L — ABNORMAL LOW (ref 98–111)
Creatinine, Ser: 0.94 mg/dL (ref 0.61–1.24)
GFR, Estimated: >60 mL/min (ref >60)
Glucose, Bld: 109 mg/dL — ABNORMAL HIGH (ref 70–99)
Potassium: 4.1 mmol/L (ref 3.5–5.1)
Sodium: 131 mmol/L — ABNORMAL LOW (ref 135–145)

## 2023-12-21 SURGERY — BRONCHOSCOPY, WITH BIOPSY USING ELECTROMAGNETIC NAVIGATION
Anesthesia: General | Laterality: Bilateral

## 2023-12-21 SURGERY — ROBOTIC ASSISTED NAVIGATIONAL BRONCHOSCOPY
Anesthesia: General | Laterality: Bilateral

## 2023-12-21 MED ORDER — PROPOFOL 500 MG/50ML IV EMUL
INTRAVENOUS | Status: DC | PRN
  Administered 2023-12-21: 125 ug/kg/min via INTRAVENOUS

## 2023-12-21 MED ORDER — CHLORHEXIDINE GLUCONATE 0.12 % MT SOLN: 15.0000 mL | Freq: Once | OROMUCOSAL | Status: AC

## 2023-12-21 MED ORDER — FENTANYL CITRATE (PF) 100 MCG/2ML IJ SOLN: 25.0000 ug | INTRAMUSCULAR | Status: DC | PRN

## 2023-12-21 MED ORDER — FENTANYL CITRATE (PF) 100 MCG/2ML IJ SOLN
INTRAMUSCULAR | Status: AC
  Filled 2023-12-21: qty 2

## 2023-12-21 MED ORDER — LIDOCAINE 2% (20 MG/ML) 5 ML SYRINGE
INTRAMUSCULAR | Status: DC | PRN
  Administered 2023-12-21: 60 mg via INTRAVENOUS

## 2023-12-21 MED ORDER — PHENYLEPHRINE 80 MCG/ML (10ML) SYRINGE FOR IV PUSH (FOR BLOOD PRESSURE SUPPORT)
PREFILLED_SYRINGE | INTRAVENOUS | Status: DC | PRN
  Administered 2023-12-21: 80 ug via INTRAVENOUS

## 2023-12-21 MED ORDER — PHENYLEPHRINE 80 MCG/ML (10ML) SYRINGE FOR IV PUSH (FOR BLOOD PRESSURE SUPPORT): PREFILLED_SYRINGE | INTRAVENOUS | Status: DC | PRN

## 2023-12-21 MED ORDER — ROCURONIUM BROMIDE 10 MG/ML (PF) SYRINGE
PREFILLED_SYRINGE | INTRAVENOUS | Status: DC | PRN
  Administered 2023-12-21: 50 mg via INTRAVENOUS
  Administered 2023-12-21: 20 mg via INTRAVENOUS

## 2023-12-21 MED ORDER — CHLORHEXIDINE GLUCONATE 0.12 % MT SOLN
OROMUCOSAL | Status: AC
  Filled 2023-12-21: qty 15

## 2023-12-21 MED ORDER — ONDANSETRON HCL 4 MG/2ML IJ SOLN
INTRAMUSCULAR | Status: DC | PRN
  Administered 2023-12-21: 4 mg via INTRAVENOUS

## 2023-12-21 MED ORDER — LACTATED RINGERS IV SOLN: INTRAVENOUS | Status: DC

## 2023-12-21 MED ORDER — FENTANYL CITRATE (PF) 250 MCG/5ML IJ SOLN
INTRAMUSCULAR | Status: DC | PRN
  Administered 2023-12-21 (×2): 50 ug via INTRAVENOUS

## 2023-12-21 MED ORDER — DEXAMETHASONE SODIUM PHOSPHATE 10 MG/ML IJ SOLN
INTRAMUSCULAR | Status: DC | PRN
  Administered 2023-12-21: 10 mg via INTRAVENOUS

## 2023-12-21 MED ORDER — SUGAMMADEX SODIUM 200 MG/2ML IV SOLN
INTRAVENOUS | Status: DC | PRN
  Administered 2023-12-21: 200 mg via INTRAVENOUS

## 2023-12-21 MED ORDER — PROPOFOL 10 MG/ML IV BOLUS
INTRAVENOUS | Status: DC | PRN
  Administered 2023-12-21: 50 mg via INTRAVENOUS
  Administered 2023-12-21: 150 mg via INTRAVENOUS

## 2023-12-21 MED ORDER — SODIUM CHLORIDE 0.9 % IV SOLN: INTRAVENOUS | Status: DC | PRN

## 2023-12-21 MED ORDER — PHENYLEPHRINE HCL-NACL 20-0.9 MG/250ML-% IV SOLN
INTRAVENOUS | Status: DC | PRN
  Administered 2023-12-21: 30 ug/min via INTRAVENOUS

## 2023-12-21 NOTE — Transfer of Care (Signed)
Immediate Anesthesia Transfer of Care Note  Patient: Joe Morton  Procedure(s) Performed: ROBOTIC ASSISTED NAVIGATIONAL BRONCHOSCOPY (Bilateral) ENDOBRONCHIAL ULTRASOUND (Bilateral) BRONCHIAL NEEDLE ASPIRATION BIOPSIES BRONCHIAL BIOPSIES BRONCHIAL WASHINGS  Patient Location: PACU  Anesthesia Type:General  Level of Consciousness: awake and sedated  Airway & Oxygen Therapy: Patient Spontanous Breathing and Patient connected to face mask oxygen  Post-op Assessment: Report given to RN and Post -op Vital signs reviewed and stable  Post vital signs: Reviewed and stable  Last Vitals:  Vitals Value Taken Time  BP    Temp    Pulse 87 12/21/23 0920  Resp 14 12/21/23 0920  SpO2 92 % 12/21/23 0920  Vitals shown include unfiled device data.  Last Pain:  Vitals:   12/21/23 0639  TempSrc:   PainSc: 4          Complications: There were no known notable events for this encounter.

## 2023-12-21 NOTE — Interval H&P Note (Signed)
Patient is adequate for the procedure.   [name], MD  [facility] 12/21/2023 7:25 AM

## 2023-12-21 NOTE — Anesthesia Procedure Notes (Signed)
Procedure Name: Intubation Date/Time: 12/21/2023 7:54 AM  Performed by: Debbe Odea, CRNAPre-anesthesia Checklist: Patient identified, Emergency Drugs available, Suction available and Patient being monitored Patient Re-evaluated:Patient Re-evaluated prior to induction Oxygen Delivery Method: Circle System Utilized Preoxygenation: Pre-oxygenation with 100% oxygen Induction Type: IV induction Ventilation: Mask ventilation without difficulty and Oral airway inserted - appropriate to patient size Laryngoscope Size: Hyacinth Meeker and 2 Grade View: Grade III Tube type: Oral Tube size: 8.5 mm Number of attempts: 1 Airway Equipment and Method: Stylet and Oral airway Placement Confirmation: ETT inserted through vocal cords under direct vision, positive ETCO2 and breath sounds checked- equal and bilateral Secured at: 24 cm Tube secured with: Tape Dental Injury: Teeth and Oropharynx as per pre-operative assessment

## 2023-12-21 NOTE — Anesthesia Postprocedure Evaluation (Signed)
Anesthesia Post Note  Patient: LADARION MUNYON  Procedure(s) Performed: ROBOTIC ASSISTED NAVIGATIONAL BRONCHOSCOPY (Bilateral) ENDOBRONCHIAL ULTRASOUND (Bilateral) BRONCHIAL NEEDLE ASPIRATION BIOPSIES BRONCHIAL BIOPSIES BRONCHIAL WASHINGS     Patient location during evaluation: PACU Anesthesia Type: General Level of consciousness: awake and alert Pain management: pain level controlled Vital Signs Assessment: post-procedure vital signs reviewed and stable Respiratory status: spontaneous breathing, nonlabored ventilation, respiratory function stable and patient connected to nasal cannula oxygen Cardiovascular status: blood pressure returned to baseline and stable Postop Assessment: no apparent nausea or vomiting Anesthetic complications: no  There were no known notable events for this encounter.  Last Vitals:  Vitals:   12/21/23 1015 12/21/23 1030  BP: 99/73 101/72  Pulse: 76 81  Resp: 14 17  Temp:  36.9 C  SpO2: 92% 94%    Last Pain:  Vitals:   12/21/23 1015  TempSrc:   PainSc: 0-No pain                 Jericka Kadar L Krystyl Cannell

## 2023-12-21 NOTE — Op Note (Signed)
Video Bronchoscopy with Robotic Assisted Bronchoscopic Navigation   Date of Operation: 12/21/2023   Pre-op Diagnosis: Left upper lobe nodule  Post-op Diagnosis: Left upper lobe nodule consistent with malignancy   Surgeon: Saryn Cherry/Dgayli  Anesthesia: General endotracheal anesthesia  Operation: Flexible video fiberoptic bronchoscopy with robotic assistance and biopsies. Endobronchial Ultrasound with TBNA 11Ri, 7.   Estimated Blood Loss: Minimal  Complications: None  Indications and History: Joe Morton is a 73 y.o. male with history of Left upper lobe nodule. The risks, benefits, complications, treatment options and expected outcomes were discussed with the patient.  The possibilities of pneumothorax, pneumonia, reaction to medication, pulmonary aspiration, perforation of a viscus, bleeding, failure to diagnose a condition and creating a complication requiring transfusion or operation were discussed with the patient who freely signed the consent.    Description of Procedure: The patient was seen in the Preoperative Area, was examined and was deemed appropriate to proceed.  The patient was taken to [facility] endoscopy room 3, identified as Joe Morton and the procedure verified as Flexible Video Fiberoptic Bronchoscopy.  A Time Out was held and the above information confirmed.   Prior to the date of the procedure a high-resolution CT scan of the chest was performed. Utilizing ION software program a virtual tracheobronchial tree was generated to allow the creation of distinct navigation pathways to the patient's parenchymal abnormalities. After being taken to the operating room general anesthesia was initiated and the patient  was orally intubated. The video fiberoptic bronchoscope was introduced via the endotracheal tube and a general inspection was performed which showed normal right and left lung anatomy, aspiration of the bilateral mainstems was completed to remove any remaining secretions. Robotic  catheter inserted into patient's endotracheal tube.   Target #1 Left upper lobe nodule: The distinct navigation pathways prepared prior to this procedure were then utilized to navigate to patient's lesion identified on CT scan. The robotic catheter was secured into place and the vision probe was withdrawn. Lesion location was approximated using fluoroscopy and Conebeam CT Ciospin. Under fluoroscopic guidance transbronchial needle biopsies, and transbronchial forceps biopsies were performed to be sent for cytology and pathology. A bronchioalveolar lavage was performed in the LUL and sent for cytology.  We then proceeded with EBUS and noted station 11Ri and Station 7 were enlarged. Station 4R was evaluated and noted to be small and inadequate for biopsies. TBNA was obtained from stations 11Ri and 7.   At the end of the procedure a general airway inspection was performed and there was no evidence of active bleeding. The bronchoscope was removed.  The patient tolerated the procedure well. There was no significant blood loss and there were no obvious complications. A post-procedural chest x-ray is pending.  Samples Target #1 1. Transbronchial needle biopsies from LUL nodule 2. Transbronchial forceps biopsies from LUL nodule 3. Bronchoalveolar lavage from LUL  Plans:  The patient will be discharged from the PACU to home when recovered from anesthesia and after chest x-ray is reviewed. We will review the cytology, pathology and microbiology results with the patient when they become available. Outpatient followup will be with Oncology.   [name], MD [facility] 12/21/2023 9:18 AM

## 2023-12-22 ENCOUNTER — Encounter (HOSPITAL_COMMUNITY): Payer: Self-pay | Admitting: Pulmonary Disease

## 2023-12-22 LAB — CYTOLOGY - NON PAP

## 2023-12-25 ENCOUNTER — Inpatient Hospital Stay: Payer: Medicare Other | Attending: Oncology | Admitting: Oncology

## 2023-12-25 ENCOUNTER — Encounter: Payer: Self-pay | Admitting: Oncology

## 2023-12-25 ENCOUNTER — Telehealth: Payer: Self-pay

## 2023-12-25 ENCOUNTER — Inpatient Hospital Stay: Payer: Medicare Other

## 2023-12-25 VITALS — BP 127/91 | HR 84 | Temp 97.2°F | Resp 18 | Wt 156.3 lb

## 2023-12-25 DIAGNOSIS — F17210 Nicotine dependence, cigarettes, uncomplicated: Secondary | ICD-10-CM | POA: Diagnosis not present

## 2023-12-25 DIAGNOSIS — C7951 Secondary malignant neoplasm of bone: Secondary | ICD-10-CM | POA: Insufficient documentation

## 2023-12-25 DIAGNOSIS — Z7189 Other specified counseling: Secondary | ICD-10-CM | POA: Insufficient documentation

## 2023-12-25 DIAGNOSIS — D72829 Elevated white blood cell count, unspecified: Secondary | ICD-10-CM | POA: Insufficient documentation

## 2023-12-25 DIAGNOSIS — C3412 Malignant neoplasm of upper lobe, left bronchus or lung: Secondary | ICD-10-CM | POA: Insufficient documentation

## 2023-12-25 LAB — COMPREHENSIVE METABOLIC PANEL
ALT: 21 U/L (ref 0–44)
AST: 26 U/L (ref 15–41)
Albumin: 3.8 g/dL (ref 3.5–5.0)
Alkaline Phosphatase: 609 U/L — ABNORMAL HIGH (ref 38–126)
Anion gap: 8 (ref 5–15)
BUN: 11 mg/dL (ref 8–23)
CO2: 26 mmol/L (ref 22–32)
Calcium: 9.5 mg/dL (ref 8.9–10.3)
Chloride: 96 mmol/L — ABNORMAL LOW (ref 98–111)
Creatinine, Ser: 0.83 mg/dL (ref 0.61–1.24)
GFR, Estimated: >60 mL/min (ref >60)
Glucose, Bld: 109 mg/dL — ABNORMAL HIGH (ref 70–99)
Potassium: 4.4 mmol/L (ref 3.5–5.1)
Sodium: 130 mmol/L — ABNORMAL LOW (ref 135–145)
Total Bilirubin: 0.6 mg/dL (ref 0.0–1.2)
Total Protein: 7.3 g/dL (ref 6.5–8.1)

## 2023-12-25 LAB — CBC WITH DIFFERENTIAL/PLATELET
Abs Immature Granulocytes: 0.12 10*3/uL — ABNORMAL HIGH (ref 0.00–0.07)
Basophils Absolute: 0.1 10*3/uL (ref 0.0–0.1)
Basophils Relative: 1 %
Eosinophils Absolute: 0.3 10*3/uL (ref 0.0–0.5)
Eosinophils Relative: 2 %
HCT: 49.8 % (ref 39.0–52.0)
Hemoglobin: 17.3 g/dL — ABNORMAL HIGH (ref 13.0–17.0)
Immature Granulocytes: 1 %
Lymphocytes Relative: 9 %
Lymphs Abs: 1.1 10*3/uL (ref 0.7–4.0)
MCH: 31.9 pg (ref 26.0–34.0)
MCHC: 34.7 g/dL (ref 30.0–36.0)
MCV: 91.9 fL (ref 80.0–100.0)
Monocytes Absolute: 1.8 10*3/uL — ABNORMAL HIGH (ref 0.1–1.0)
Monocytes Relative: 16 %
Neutro Abs: 8 10*3/uL — ABNORMAL HIGH (ref 1.7–7.7)
Neutrophils Relative %: 71 %
Platelets: 204 10*3/uL (ref 150–400)
RBC: 5.42 MIL/uL (ref 4.22–5.81)
RDW: 12.6 % (ref 11.5–15.5)
WBC: 11.2 10*3/uL — ABNORMAL HIGH (ref 4.0–10.5)
nRBC: 0 % (ref 0.0–0.2)

## 2023-12-25 LAB — LACTATE DEHYDROGENASE: LDH: 278 U/L — ABNORMAL HIGH (ref 98–192)

## 2023-12-25 LAB — CYTOLOGY - NON PAP

## 2023-12-25 LAB — MAGNESIUM: Magnesium: 2 mg/dL (ref 1.7–2.4)

## 2023-12-25 NOTE — Assessment & Plan Note (Signed)
Discussed with patient and he understands that his condition is not curable.  Treatment is with palliative intent.

## 2023-12-25 NOTE — Assessment & Plan Note (Addendum)
Images and biopsy results were reviewed and discussed with patient and wife.  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  I recommend MRI brain w wo contrast to complete staging.  Check NGS and PD-L1 If he does not have actionable mutations, I recommend palliative chemotherapy +/- immunotherapy.  Rationale and side effects were reviewed with patient.  Patient is undecided and he leans towards to no treatments.  We will discuss again after his MRI brain and NGS result.  Refer to palliative care service.

## 2023-12-25 NOTE — Assessment & Plan Note (Signed)
Mild right rib cage pain. He may use Tylenol PRN for pain.  If pain gets worse, consider RT and narcotics for symptom palliation

## 2023-12-25 NOTE — Assessment & Plan Note (Signed)
Predominantly neutrophilia. Likely reactive due to cancer, smoking

## 2023-12-25 NOTE — Telephone Encounter (Signed)
[facility] requested (xT & xR with PD-L1) on [id number] collected on 12/21/23.   Financial application was submitted and approved. Pt's out of-of-pocket cost will be $0.

## 2023-12-25 NOTE — Progress Notes (Signed)
Hematology/Oncology Consult Note [phone] [phone]     REFERRING PROVIDER: [name], MD    CHIEF COMPLAINTS/PURPOSE OF CONSULTATION:  Left lung adenocarcinoma  ASSESSMENT & PLAN:   Malignant neoplasm of upper lobe of left lung [facility]) Images and biopsy results were reviewed and discussed with patient and wife.  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  I recommend MRI brain w wo contrast to complete staging.  Check NGS and PD-L1 If he does not have actionable mutations, I recommend palliative chemotherapy +/- immunotherapy.  Rationale and side effects were reviewed with patient.  Patient is undecided and he leans towards to no treatments.  We will discuss again after his MRI brain and NGS result.  Refer to palliative care service.   Leukocytosis Predominantly neutrophilia. 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 He may use Tylenol PRN for pain.  If pain gets worse, consider RT and narcotics for symptom palliation  Goals of care, counseling/discussion Discussed with patient and he understands that his condition is not curable.  Treatment is with palliative intent.    Orders Placed This Encounter  Procedures   MR Brain W Wo Contrast    Standing Status:   Future    Expected Date:   01/01/2024    Expiration Date:   12/24/2024    If indicated for the ordered procedure, I authorize the administration of contrast media per Radiology protocol:   Yes    What is the patient's sedation requirement?:   No Sedation    Does the patient have a pacemaker or implanted devices?:   No    Use SRS Protocol?:   No    Preferred imaging location?:   [facility] (table limit - 550lbs)   Comprehensive metabolic panel    Standing Status:   Future    Number of Occurrences:   1    Expected Date:   12/25/2023    Expiration Date:   12/24/2024   CBC  with Differential/Platelet    Standing Status:   Future    Number of Occurrences:   1    Expected Date:   12/25/2023    Expiration Date:   12/24/2024   Lactate dehydrogenase    Standing Status:   Future    Number of Occurrences:   1    Expected Date:   12/25/2023    Expiration Date:   12/24/2024   Magnesium    Standing Status:   Future    Number of Occurrences:   1    Expected Date:   12/25/2023    Expiration Date:   12/24/2024   Ambulatory Referral to Palliative Care    Referral Priority:   Routine    Referral Type:   Consultation    Referral Reason:   Advance Care Planning    Number of Visits Requested:   1   Follow up in a few weeks.  All questions were answered. The patient knows to call the clinic with any problems, questions or concerns.  Rickard Patience, MD, PhD [facility] Hematology Oncology 12/25/2023    HISTORY OF PRESENTING ILLNESS:  Joe Morton 73 y.o. male presents to establish care for lung cancer I have reviewed his chart and materials related to his cancer extensively and collaborated history with the patient. Summary of oncologic history is as follows: Oncology History  Malignant neoplasm of upper lobe of left  lung (HCC)  11/11/2023 Imaging   CT angio chest aorta w contrast  1. 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 Recommend annual imaging followup by CTA or MRA. This recommendation follows 2010 ACCF/AHA/AATS/ACR/ASA/SCA/SCAI/SIR/STS/SVM Guidelines for the Diagnosis and Management of Patients with Thoracic Aortic Disease. Circulation. 2010; 121: Z610-R604. Aortic aneurysm NOS (ICD10-I71.9) 2. Stable tortuosity of the aortic arch. 3. Significant emphysematous lung disease with diffuse perihilar bronchial thickening bilaterally. 4. There is an area of spiculation at a convergence point of posterior left upper lobe bronchi over a region measuring roughly 1.9 x 1.3 x 2.0 cm. This is also associated with  adjacent nodularity and thickening of the adjacent major fissure over several cm. This was not present in 2019. Although this may be secondary to inflammation/infection, malignancy is not excluded and further follow-up and workup may be indicated including short-term interval follow-up CT, possible PET scan and consideration of bronchoscopy targeted to this region. Referral to Pulmonology for follow-up is recommended. 5. Numerous small scattered mediastinal lymph nodes are present. The largest measures roughly 8 mm in short axis at the level of the AP window. 6. Probable hepatic steatosis.    12/16/2023 Imaging   CT super D chest wo contrast  1. Progressive soft tissue fullness in the left hilum and suprahilar region, encasing the bronchi to the apical and lingular segments of the left upper lobe. This soft tissue is confluent with a 6.7 x 3.5 cm masslike consolidative opacity in the posterior left upper lobe, new in the interval. Imaging features raise concern for neoplasm with volume loss in the posterior and anteromedial left upper lobe. 2. New small left pleural effusion. 3. Waxing and waning of small pulmonary nodules in the right lung. Continued close attention on follow-up recommended. 4. Aortic Atherosclerosis (ICD10-I70.0) and Emphysema (ICD10-J43.9   12/16/2023 Imaging   PET scan showed  1. Hypermetabolic left upper lobe pulmonary mass invading the left hilum consistent with primary lung neoplasm. 2. Hypermetabolic mediastinal and left hilar lymphadenopathy consistent with metastatic disease. 3. Diffuse scattered hypermetabolic bone lesions consistent with widespread metastatic bone disease. 4. No findings for metastatic disease involving the abdomen/pelvis. 5. Small left pleural effusion but no hypermetabolic pleural lesions. 6. Aortic atherosclerosis.   Aortic Atherosclerosis (ICD10-I70.0).   12/21/2023 Initial Diagnosis   Malignant neoplasm of upper lobe of left  lung (HCC)  Biopsy via bronchoscopy showed  A. LUNG, LUL, FINE NEEDLE ASPIRATION  BIOPSY:  - Non-small cell carcinoma , TTF 1 positive, p40 and cytokeratin 5/6 negative, consistent with adenocarcinoma.   B. LYMPH NODE, STATION 11R INFERIOR, FINE NEEDLE ASPIRATION:  - Rare atypical cells suspicious for carcinoma   C. LYMPH NODE, STATION 7, FINE NEEDLE ASPIRATION:  - Non-small cell carcinoma   D. LUNG, LUL, LAVAGE:  FINAL MICROSCOPIC DIAGNOSIS:  - No malignant cells identified  - Benign bronchial cells and pulmonary macrophages      12/25/2023 Cancer Staging   Staging form: Lung, AJCC V9 - Clinical: Stage IVB (cT3, cN2, cM1c1) - Signed by Rickard Patience, MD on 12/25/2023 Stage prefix: Initial diagnosis     Patient was accompanied by wife. He reports wet cough for a while, no hemoptysis. No SOB Mild right rib cage pain. + headache. No unintentional weight loss.  He has an extensive smoking history, current he has tried to smoke less cigarettes.   MEDICAL HISTORY:  Past Medical History:  Diagnosis Date   Emphysema lung (HCC)    Hypertension  SURGICAL HISTORY: Past Surgical History:  Procedure Laterality Date   BRONCHIAL BIOPSY  12/21/2023   Procedure: BRONCHIAL BIOPSIES;  Surgeon: [name], MD;  Location: [facility];  Service: Pulmonary;;   BRONCHIAL NEEDLE ASPIRATION BIOPSY  12/21/2023   Procedure: BRONCHIAL NEEDLE ASPIRATION BIOPSIES;  Surgeon: [name], MD;  Location: [facility];  Service: Pulmonary;;   BRONCHIAL WASHINGS  12/21/2023   Procedure: BRONCHIAL WASHINGS;  Surgeon: [name], MD;  Location: [facility];  Service: Pulmonary;;   COLONOSCOPY     ENDOBRONCHIAL ULTRASOUND Bilateral 12/21/2023   Procedure: ENDOBRONCHIAL ULTRASOUND;  Surgeon: [name], MD;  Location: [facility];  Service: Pulmonary;  Laterality: Bilateral;   FINE NEEDLE ASPIRATION  12/21/2023   Procedure: FINE NEEDLE ASPIRATION (FNA) LINEAR;  Surgeon:  [name], MD;  Location: [facility];  Service: Pulmonary;;   INGUINAL HERNIA REPAIR Left 09/17/2018   Procedure: OPEN LEFT INGUINAL HERNIA REPAIR WITH MESH;  Surgeon: Jimmye Norman, MD;  Location: [facility] OR;  Service: General;  Laterality: Left;  GENERAL AND LMA   INSERTION OF MESH Left 09/17/2018   Procedure: INSERTION OF MESH;  Surgeon: Jimmye Norman, MD;  Location: [facility] OR;  Service: General;  Laterality: Left;  GENERAL AND LMA    SOCIAL HISTORY: Social History   Socioeconomic History   Marital status: Married    Spouse name: Not on file   Number of children: Not on file   Years of education: Not on file   Highest education level: Not on file  Occupational History   Not on file  Tobacco Use   Smoking status: Every Day    Current packs/day: 1.00    Average packs/day: 1 pack/day for 44.1 years (44.1 ttl pk-yrs)    Types: Cigarettes    Start date: 47   Smokeless tobacco: Never   Tobacco comments:    Smoking 3 cigarettes a day x 2 weeks   Vaping Use   Vaping status: Never Used  Substance and Sexual Activity   Alcohol use: Yes    Alcohol/week: 6.0 standard drinks of alcohol    Types: 6 Standard drinks or equivalent per week    Comment: occasionally   Drug use: No   Sexual activity: Not on file  Other Topics Concern   Not on file  Social History Narrative   Not on file   Social Drivers of Health   Financial Resource Strain: Low Risk  (12/25/2023)   Overall Financial Resource Strain (CARDIA)    Difficulty of Paying Living Expenses: Not very hard  Food Insecurity: No Food Insecurity (12/25/2023)   Hunger Vital Sign    Worried About Running Out of Food in the Last Year: Never true    Ran Out of Food in the Last Year: Never true  Transportation Needs: No Transportation Needs (12/25/2023)   PRAPARE - Administrator, Civil Service (Medical): No    Lack of Transportation (Non-Medical): No  Physical Activity: Not on file  Stress: No Stress Concern Present  (12/25/2023)   [employer] of Occupational Health - Occupational Stress Questionnaire    Feeling of Stress : Only a little  Social Connections: Not on file  Intimate Partner Violence: Not At Risk (12/25/2023)   Humiliation, Afraid, Rape, and Kick questionnaire    Fear of Current or Ex-Partner: No    Emotionally Abused: No    Physically Abused: No    Sexually Abused: No    FAMILY HISTORY: History reviewed. No pertinent family history.  ALLERGIES:  has no  active allergies.  MEDICATIONS:  Current Outpatient Medications  Medication Sig Dispense Refill   acetaminophen (TYLENOL) 80 MG chewable tablet Chew 500 mg by mouth every 6 (six) hours as needed.     amLODipine (NORVASC) 5 MG tablet Take 2.5 mg by mouth daily. (Patient not taking: Reported on 12/25/2023)     No current facility-administered medications for this visit.    Review of Systems  Constitutional:  Positive for fatigue. Negative for appetite change, chills, fever and unexpected weight change.  HENT:   Negative for hearing loss and voice change.   Eyes:  Negative for eye problems and icterus.  Respiratory:  Positive for cough. Negative for chest tightness, hemoptysis and shortness of breath.   Cardiovascular:  Negative for chest pain and leg swelling.  Gastrointestinal:  Negative for abdominal distention and abdominal pain.  Endocrine: Negative for hot flashes.  Genitourinary:  Negative for difficulty urinating, dysuria and frequency.   Musculoskeletal:  Negative for arthralgias.  Skin:  Negative for itching and rash.  Neurological:  Negative for light-headedness and numbness.  Hematological:  Negative for adenopathy. Does not bruise/bleed easily.  Psychiatric/Behavioral:  Negative for confusion.      PHYSICAL EXAMINATION: ECOG PERFORMANCE STATUS: 1 - Symptomatic but completely ambulatory  Vitals:   12/25/23 0931  BP: (!) 127/91  Pulse: 84  Resp: 18  Temp: (!) 97.2 F (36.2 C)  SpO2: 95%   Filed Weights    12/25/23 0931  Weight: 156 lb 4.8 oz (70.9 kg)    Physical Exam Constitutional:      General: He is not in acute distress.    Appearance: He is not diaphoretic.  HENT:     Head: Normocephalic and atraumatic.  Eyes:     General: No scleral icterus.    Pupils: Pupils are equal, round, and reactive to light.  Cardiovascular:     Rate and Rhythm: Normal rate and regular rhythm.     Heart sounds: No murmur heard. Pulmonary:     Effort: Pulmonary effort is normal. No respiratory distress.     Breath sounds: Normal breath sounds. No wheezing or rhonchi.  Abdominal:     General: Bowel sounds are normal. There is no distension.     Palpations: Abdomen is soft.     Tenderness: There is no abdominal tenderness.  Musculoskeletal:        General: Normal range of motion.     Cervical back: Normal range of motion and neck supple.  Skin:    General: Skin is warm and dry.     Findings: No erythema.  Neurological:     Mental Status: He is alert and oriented to person, place, and time. Mental status is at baseline.     Motor: No abnormal muscle tone.  Psychiatric:        Mood and Affect: Mood and affect normal.      LABORATORY DATA:  I have reviewed the data as listed    Latest Ref Rng & Units 12/25/2023   10:32 AM 12/21/2023    6:30 AM 09/10/2018    8:07 AM  CBC  WBC 4.0 - 10.5 K/uL 11.2  9.4  5.9   Hemoglobin 13.0 - 17.0 g/dL 24.4  01.0  27.2   Hematocrit 39.0 - 52.0 % 49.8  46.9  56.8   Platelets 150 - 400 K/uL 204  175  221       Latest Ref Rng & Units 12/25/2023   10:32 AM 12/21/2023    6:30  AM 09/10/2018    8:07 AM  CMP  Glucose 70 - 99 mg/dL 478  295  621   BUN 8 - 23 mg/dL 11  8  8    Creatinine 0.61 - 1.24 mg/dL 3.08  6.57  8.46   Sodium 135 - 145 mmol/L 130  131  137   Potassium 3.5 - 5.1 mmol/L 4.4  4.1  4.5   Chloride 98 - 111 mmol/L 96  95  103   CO2 22 - 32 mmol/L 26  24  25    Calcium 8.9 - 10.3 mg/dL 9.5  9.6  9.6   Total Protein 6.5 - 8.1 g/dL 7.3     Total  Bilirubin 0.0 - 1.2 mg/dL 0.6     Alkaline Phos 38 - 126 U/L 609     AST 15 - 41 U/L 26     ALT 0 - 44 U/L 21        RADIOGRAPHIC STUDIES: I have personally reviewed the radiological images as listed and agreed with the findings in the report. DG Chest Port 1 View Result Date: 12/21/2023 CLINICAL DATA:  Status post bronchoscopy. EXAM: PORTABLE CHEST 1 VIEW COMPARISON:  September 10, 2018.  December 08, 2023. FINDINGS: Normal cardiac size. Left perihilar and upper lobe opacity is noted concerning for atelectasis or infiltrate and underlying neoplasm. No pneumothorax is noted status post bronchoscopy. Right lungs clear. IMPRESSION: Left perihilar and upper lobe opacities noted concerning for atelectasis or infiltrate and underlying neoplasm as noted on prior CT scan. No pneumothorax status post bronchoscopy. Electronically Signed   By: Lupita Raider M.D.   On: 12/21/2023 10:25   DG C-ARM BRONCHOSCOPY Result Date: 12/21/2023 C-ARM BRONCHOSCOPY: Fluoroscopy was utilized by the requesting physician.  No radiographic interpretation.   NM PET Image Initial (PI) Skull Base To Thigh (F-18 FDG) Result Date: 12/16/2023 CLINICAL DATA:  Initial treatment strategy for left upper lobe pulmonary mass. EXAM: NUCLEAR MEDICINE PET SKULL BASE TO THIGH TECHNIQUE: 8.61 mCi F-18 FDG was injected intravenously. Full-ring PET imaging was performed from the skull base to thigh after the radiotracer. CT data was obtained and used for attenuation correction and anatomic localization. Fasting blood glucose: 97 mg/dl COMPARISON:  Chest CT, same date. FINDINGS: Mediastinal blood pool activity: SUV max 2.13 Liver activity: SUV max NA NECK: No hypermetabolic lymph nodes in the neck. Incidental CT findings: Scattered calcifications in the tonsillar regions consistent with prior inflammation or infection. CHEST: The left upper lobe pulmonary lesion adjacent to the major fissure is hypermetabolic with SUV max of 6.0 and consistent with  primary lung neoplasm. There is hypermetabolic mediastinal lymphadenopathy. Right paratracheal node has an SUV max of 4.87. Left hilar disease which could be direct extension of tumor or adenopathy has an SUV max of 6.66. This surrounds the left upper lobe bronchus and partially occludes it. Subcarinal node has an SUV max of 5.36. Left pleural effusion but no hypermetabolic pleural lesions. Incidental CT findings: Stable vascular calcifications. Small left pleural effusion. ABDOMEN/PELVIS: No abnormal hypermetabolic activity within the liver, pancreas, adrenal glands, or spleen. No hypermetabolic lymph nodes in the abdomen or pelvis. Incidental CT findings: Moderate atherosclerotic calcifications involving the aorta and branch vessels but no aneurysm. Scattered colonic diverticulosis. SKELETON: Diffuse scattered hypermetabolic bone lesions consistent with widespread metastatic bone disease. Sternal lesion has an SUV max of 3.42 Right sixth rib lesion has an SUV max of 4.59 T7 lesion has an SUV max of 5.26 Right iliac bone lesion has an SUV  max of 5.89. Left hip/bus or trochanteric lesion has an SUV max of 4.39. Incidental CT findings: None. IMPRESSION: 1. Hypermetabolic left upper lobe pulmonary mass invading the left hilum consistent with primary lung neoplasm. 2. Hypermetabolic mediastinal and left hilar lymphadenopathy consistent with metastatic disease. 3. Diffuse scattered hypermetabolic bone lesions consistent with widespread metastatic bone disease. 4. No findings for metastatic disease involving the abdomen/pelvis. 5. Small left pleural effusion but no hypermetabolic pleural lesions. 6. Aortic atherosclerosis. Aortic Atherosclerosis (ICD10-I70.0). Electronically Signed   By: Rudie Meyer M.D.   On: 12/16/2023 17:36   CT SUPER D CHEST WO CONTRAST Result Date: 12/16/2023 CLINICAL DATA:  Left upper lobe pulmonary nodule. EXAM: CT CHEST WITHOUT CONTRAST TECHNIQUE: Multidetector CT imaging of the chest was  performed using thin slice collimation for electromagnetic bronchoscopy planning purposes, without intravenous contrast. RADIATION DOSE REDUCTION: This exam was performed according to the departmental dose-optimization program which includes automated exposure control, adjustment of the mA and/or kV according to patient size and/or use of iterative reconstruction technique. COMPARISON:  11/07/2023 FINDINGS: Cardiovascular: The heart size is normal. No substantial pericardial effusion. Ascending thoracic aorta measures 4.1 cm diameter. Mild atherosclerotic calcification is noted in the wall of the thoracic aorta. Mediastinum/Nodes: Scattered normal and upper normal sized lymph nodes are seen in the mediastinum including 9 mm short axis right paratracheal node on 67/2. Calcified nodal tissue is seen in the left hilum with abnormal soft tissue density in the left suprahilar region encasing the left upper lobe bronchus. The esophagus has normal imaging features.There is no axillary lymphadenopathy. Lungs/Pleura: Centrilobular emphsyema noted. 3 mm right middle lobe nodule on image 130/4 is new in the interval. 5 mm subpleural right upper lobe nodule on 73/4 is stable. 4 mm right upper lobe nodule on 61/4 is new. 2 mm right upper lobe nodule on 41/4 has decreased from 4 mm previously. 5 mm subpleural right lower lobe nodule on 91/4 is stable. Soft tissue fullness in the right hilum and suprahilar region is progressive in the interval, encasing the bronchi to the apical in lingular segments of the left upper lobe. 6.7 x 3.5 cm masslike consolidative opacity in the posterior left upper lobe is new in the interval. This is associated with anteromedial left upper lobe volume loss adjacent to the mediastinum. Calcified granulomata noted left base. Small left pleural effusion is new in the interval. Upper Abdomen: No acute findings. 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  Musculoskeletal: No worrisome lytic or sclerotic osseous abnormality. IMPRESSION: 1. Progressive soft tissue fullness in the left hilum and suprahilar region, encasing the bronchi to the apical and lingular segments of the left upper lobe. This soft tissue is confluent with a 6.7 x 3.5 cm masslike consolidative opacity in the posterior left upper lobe, new in the interval. Imaging features raise concern for neoplasm with volume loss in the posterior and anteromedial left upper lobe. 2. New small left pleural effusion. 3. Waxing and waning of small pulmonary nodules in the right lung. Continued close attention on follow-up recommended. 4. Aortic Atherosclerosis (ICD10-I70.0) and Emphysema (ICD10-J43.9). Electronically Signed   By: Kennith Center M.D.   On: 12/16/2023 14:03

## 2023-12-26 ENCOUNTER — Encounter: Payer: Self-pay | Admitting: Oncology

## 2023-12-28 ENCOUNTER — Inpatient Hospital Stay (HOSPITAL_BASED_OUTPATIENT_CLINIC_OR_DEPARTMENT_OTHER): Payer: Medicare Other | Admitting: Hospice and Palliative Medicine

## 2023-12-28 ENCOUNTER — Other Ambulatory Visit: Payer: Self-pay

## 2023-12-28 ENCOUNTER — Ambulatory Visit
Admission: RE | Admit: 2023-12-28 | Discharge: 2023-12-28 | Disposition: A | Discharge status: Home / Self Care | Payer: Medicare Other | Source: Ambulatory Visit | Attending: Hospice and Palliative Medicine | Admitting: Hospice and Palliative Medicine

## 2023-12-28 ENCOUNTER — Ambulatory Visit (HOSPITAL_COMMUNITY): Payer: Medicare Other

## 2023-12-28 VITALS — BP 127/88 | HR 85 | Temp 96.1°F | Resp 18 | Wt 153.6 lb

## 2023-12-28 DIAGNOSIS — C3412 Malignant neoplasm of upper lobe, left bronchus or lung: Secondary | ICD-10-CM | POA: Insufficient documentation

## 2023-12-28 DIAGNOSIS — D72829 Elevated white blood cell count, unspecified: Secondary | ICD-10-CM | POA: Diagnosis not present

## 2023-12-28 DIAGNOSIS — C3490 Malignant neoplasm of unspecified part of unspecified bronchus or lung: Secondary | ICD-10-CM | POA: Diagnosis not present

## 2023-12-28 DIAGNOSIS — I82431 Acute embolism and thrombosis of right popliteal vein: Secondary | ICD-10-CM | POA: Diagnosis not present

## 2023-12-28 DIAGNOSIS — R519 Headache, unspecified: Secondary | ICD-10-CM | POA: Diagnosis not present

## 2023-12-28 DIAGNOSIS — I6782 Cerebral ischemia: Secondary | ICD-10-CM | POA: Diagnosis not present

## 2023-12-28 DIAGNOSIS — F17210 Nicotine dependence, cigarettes, uncomplicated: Secondary | ICD-10-CM | POA: Diagnosis not present

## 2023-12-28 DIAGNOSIS — C7951 Secondary malignant neoplasm of bone: Secondary | ICD-10-CM | POA: Diagnosis not present

## 2023-12-28 DIAGNOSIS — R600 Localized edema: Secondary | ICD-10-CM | POA: Diagnosis not present

## 2023-12-28 DIAGNOSIS — M79661 Pain in right lower leg: Secondary | ICD-10-CM | POA: Diagnosis not present

## 2023-12-28 MED ORDER — HYDROCODONE-ACETAMINOPHEN 5-325 MG PO TABS: 1.0000 | ORAL_TABLET | Freq: Four times a day (QID) | ORAL | 0 refills | Status: DC | PRN

## 2023-12-28 MED ORDER — MIRTAZAPINE 7.5 MG PO TABS: 7.5000 mg | ORAL_TABLET | Freq: Every day | ORAL | 2 refills | Status: DC

## 2023-12-28 MED ORDER — APIXABAN (ELIQUIS) VTE STARTER PACK (10MG AND 5MG): ORAL_TABLET | ORAL | 0 refills | Status: DC

## 2023-12-28 MED ORDER — IOHEXOL 300 MG/ML  SOLN
75.0000 mL | Freq: Once | INTRAMUSCULAR | Status: AC | PRN
  Administered 2023-12-28: 75 mL via INTRAVENOUS

## 2023-12-28 MED ORDER — ONDANSETRON HCL 8 MG PO TABS: 8.0000 mg | ORAL_TABLET | Freq: Three times a day (TID) | ORAL | 0 refills | Status: DC | PRN

## 2023-12-28 NOTE — Addendum Note (Signed)
 Addended by: Peggyann Bower on: 12/28/2023 04:20 PM   Modules accepted: Orders

## 2023-12-28 NOTE — Progress Notes (Signed)
 Patient states he's having right calf pain. He also has some generalized pain as well, but calf is most bothersome at this time.

## 2023-12-28 NOTE — Progress Notes (Addendum)
 Palliative Medicine [facility] at [facility] [phone] [phone]   Name: Joe Morton Date: 12/28/2023 MRN: [id number]  DOB: 06/10/1951  Patient Care Team: Charlott Dorn LABOR, MD as PCP - General (Internal Medicine) Babara Call, MD as Consulting Physician (Oncology) Verdene Gills, RN as Oncology Nurse Navigator    REASON FOR CONSULTATION: Joe Morton is a 73 y.o. 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  Patient met with medical oncology was unsure if he wished to proceed with cancer treatment.  Was referred to palliative care to address goals and manage any ongoing symptoms.  SOCIAL HISTORY:     reports that he has been smoking cigarettes. He started smoking about 44 years ago. He has a 44.1 pack-year smoking history. He has never used smokeless tobacco. He reports current alcohol use of about 6.0 standard drinks of alcohol per week. He reports that he does not use drugs.  Patient lives at home with his wife of 52 years.  Has a daughter and granddaughter in [place] .  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  ADVANCE DIRECTIVES:    CODE STATUS:   PAST MEDICAL HISTORY: Past Medical History:  Diagnosis Date   Emphysema lung (HCC)    Hypertension     PAST SURGICAL HISTORY:  Past Surgical History:  Procedure Laterality Date   BRONCHIAL BIOPSY  12/21/2023   Procedure: BRONCHIAL BIOPSIES;  Surgeon: Malka Domino, MD;  Location: [facility];  Service: Pulmonary;;   BRONCHIAL NEEDLE ASPIRATION BIOPSY  12/21/2023   Procedure: BRONCHIAL NEEDLE ASPIRATION BIOPSIES;  Surgeon: Malka Domino, MD;  Location: [facility];  Service: Pulmonary;;   BRONCHIAL WASHINGS  12/21/2023   Procedure: BRONCHIAL WASHINGS;  Surgeon: Malka Domino, MD;  Location: [facility];  Service: Pulmonary;;    COLONOSCOPY     ENDOBRONCHIAL ULTRASOUND Bilateral 12/21/2023   Procedure: ENDOBRONCHIAL ULTRASOUND;  Surgeon: Malka Domino, MD;  Location: [facility];  Service: Pulmonary;  Laterality: Bilateral;   FINE NEEDLE ASPIRATION  12/21/2023   Procedure: FINE NEEDLE ASPIRATION (FNA) LINEAR;  Surgeon: Malka Domino, MD;  Location: [facility];  Service: Pulmonary;;   INGUINAL HERNIA REPAIR Left 09/17/2018   Procedure: OPEN LEFT INGUINAL HERNIA REPAIR WITH MESH;  Surgeon: Kimble Agent, MD;  Location: [facility] OR;  Service: General;  Laterality: Left;  GENERAL AND LMA   INSERTION OF MESH Left 09/17/2018   Procedure: INSERTION OF MESH;  Surgeon: Kimble Agent, MD;  Location: [facility] OR;  Service: General;  Laterality: Left;  GENERAL AND LMA    HEMATOLOGY/ONCOLOGY HISTORY:  Oncology History  Malignant neoplasm of upper lobe of left lung (HCC)  11/11/2023 Imaging   CT angio chest aorta w contrast  1. 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 Recommend annual imaging followup by CTA or MRA. This recommendation follows 2010 ACCF/AHA/AATS/ACR/ASA/SCA/SCAI/SIR/STS/SVM Guidelines for the Diagnosis and Management of Patients with Thoracic Aortic Disease. Circulation. 2010; 121: Z733-z630. Aortic aneurysm NOS (ICD10-I71.9) 2. Stable tortuosity of the aortic arch. 3. Significant emphysematous lung disease with diffuse perihilar bronchial thickening bilaterally. 4. There is an area of spiculation at a convergence point of posterior left upper lobe bronchi over a region measuring roughly 1.9 x 1.3 x 2.0 cm. This is also associated with adjacent nodularity and thickening of the adjacent major fissure over several cm. This was not  present in 2019. Although this may be secondary to inflammation/infection, malignancy is not excluded and further follow-up and workup may be indicated including short-term interval follow-up CT, possible PET scan and  consideration of bronchoscopy targeted to this region. Referral to Pulmonology for follow-up is recommended. 5. Numerous small scattered mediastinal lymph nodes are present. The largest measures roughly 8 mm in short axis at the level of the AP window. 6. Probable hepatic steatosis.    12/16/2023 Imaging   CT super D chest wo contrast  1. Progressive soft tissue fullness in the left hilum and suprahilar region, encasing the bronchi to the apical and lingular segments of the left upper lobe. This soft tissue is confluent with a 6.7 x 3.5 cm masslike consolidative opacity in the posterior left upper lobe, new in the interval. Imaging features raise concern for neoplasm with volume loss in the posterior and anteromedial left upper lobe. 2. New small left pleural effusion. 3. Waxing and waning of small pulmonary nodules in the right lung. Continued close attention on follow-up recommended. 4. Aortic Atherosclerosis (ICD10-I70.0) and Emphysema (ICD10-J43.9   12/16/2023 Imaging   PET scan showed  1. Hypermetabolic left upper lobe pulmonary mass invading the left hilum consistent with primary lung neoplasm. 2. Hypermetabolic mediastinal and left hilar lymphadenopathy consistent with metastatic disease. 3. Diffuse scattered hypermetabolic bone lesions consistent with widespread metastatic bone disease. 4. No findings for metastatic disease involving the abdomen/pelvis. 5. Small left pleural effusion but no hypermetabolic pleural lesions. 6. Aortic atherosclerosis.   Aortic Atherosclerosis (ICD10-I70.0).   12/21/2023 Initial Diagnosis   Malignant neoplasm of upper lobe of left lung (HCC)  Biopsy via bronchoscopy showed  A. LUNG, LUL, FINE NEEDLE ASPIRATION  BIOPSY:  - Non-small cell carcinoma , TTF 1 positive, p40 and cytokeratin 5/6 negative, consistent with adenocarcinoma.   B. LYMPH NODE, STATION 11R INFERIOR, FINE NEEDLE ASPIRATION:  - Rare atypical cells suspicious for carcinoma    C. LYMPH NODE, STATION 7, FINE NEEDLE ASPIRATION:  - Non-small cell carcinoma   D. LUNG, LUL, LAVAGE:  FINAL MICROSCOPIC DIAGNOSIS:  - No malignant cells identified  - Benign bronchial cells and pulmonary macrophages      12/25/2023 Cancer Staging   Staging form: Lung, AJCC V9 - Clinical: Stage IVB (cT3, cN2, cM1c1) - Signed by Babara Call, MD on 12/25/2023 Stage prefix: Initial diagnosis     ALLERGIES:  has no active allergies.  MEDICATIONS:  Current Outpatient Medications  Medication Sig Dispense Refill   acetaminophen  (TYLENOL ) 80 MG chewable tablet Chew 500 mg by mouth every 6 (six) hours as needed.     amLODipine (NORVASC) 5 MG tablet Take 2.5 mg by mouth daily. (Patient not taking: Reported on 12/25/2023)     No current facility-administered medications for this visit.    VITAL SIGNS: There were no vitals taken for this visit. There were no vitals filed for this visit.  Estimated body mass index is 23.77 kg/m as calculated from the following:   Height as of 12/21/23: 5' 8 (1.727 m).   Weight as of 12/25/23: 156 lb 4.8 oz (70.9 kg).  LABS: CBC:    Component Value Date/Time   WBC 11.2 (H) 12/25/2023 1032   HGB 17.3 (H) 12/25/2023 1032   HCT 49.8 12/25/2023 1032   PLT 204 12/25/2023 1032   MCV 91.9 12/25/2023 1032   NEUTROABS 8.0 (H) 12/25/2023 1032   LYMPHSABS 1.1 12/25/2023 1032   MONOABS 1.8 (H) 12/25/2023 1032   EOSABS 0.3 12/25/2023 1032  BASOSABS 0.1 12/25/2023 1032   Comprehensive Metabolic Panel:    Component Value Date/Time   NA 130 (L) 12/25/2023 1032   K 4.4 12/25/2023 1032   CL 96 (L) 12/25/2023 1032   CO2 26 12/25/2023 1032   BUN 11 12/25/2023 1032   CREATININE 0.83 12/25/2023 1032   GLUCOSE 109 (H) 12/25/2023 1032   CALCIUM 9.5 12/25/2023 1032   AST 26 12/25/2023 1032   ALT 21 12/25/2023 1032   ALKPHOS 609 (H) 12/25/2023 1032   BILITOT 0.6 12/25/2023 1032   PROT 7.3 12/25/2023 1032   ALBUMIN 3.8 12/25/2023 1032    RADIOGRAPHIC  STUDIES: DG Chest Port 1 View Result Date: 12/21/2023 CLINICAL DATA:  Status post bronchoscopy. EXAM: PORTABLE CHEST 1 VIEW COMPARISON:  September 10, 2018.  December 08, 2023. FINDINGS: Normal cardiac size. Left perihilar and upper lobe opacity is noted concerning for atelectasis or infiltrate and underlying neoplasm. No pneumothorax is noted status post bronchoscopy. Right lungs clear. IMPRESSION: Left perihilar and upper lobe opacities noted concerning for atelectasis or infiltrate and underlying neoplasm as noted on prior CT scan. No pneumothorax status post bronchoscopy. Electronically Signed   By: Lynwood Landy Raddle M.D.   On: 12/21/2023 10:25   DG C-ARM BRONCHOSCOPY Result Date: 12/21/2023 C-ARM BRONCHOSCOPY: Fluoroscopy was utilized by the requesting physician.  No radiographic interpretation.   NM PET Image Initial (PI) Skull Base To Thigh (F-18 FDG) Result Date: 12/16/2023 CLINICAL DATA:  Initial treatment strategy for left upper lobe pulmonary mass. EXAM: NUCLEAR MEDICINE PET SKULL BASE TO THIGH TECHNIQUE: 8.61 mCi F-18 FDG was injected intravenously. Full-ring PET imaging was performed from the skull base to thigh after the radiotracer. CT data was obtained and used for attenuation correction and anatomic localization. Fasting blood glucose: 97 mg/dl COMPARISON:  Chest CT, same date. FINDINGS: Mediastinal blood pool activity: SUV max 2.13 Liver activity: SUV max NA NECK: No hypermetabolic lymph nodes in the neck. Incidental CT findings: Scattered calcifications in the tonsillar regions consistent with prior inflammation or infection. CHEST: The left upper lobe pulmonary lesion adjacent to the major fissure is hypermetabolic with SUV max of 6.0 and consistent with primary lung neoplasm. There is hypermetabolic mediastinal lymphadenopathy. Right paratracheal node has an SUV max of 4.87. Left hilar disease which could be direct extension of tumor or adenopathy has an SUV max of 6.66. This surrounds the  left upper lobe bronchus and partially occludes it. Subcarinal node has an SUV max of 5.36. Left pleural effusion but no hypermetabolic pleural lesions. Incidental CT findings: Stable vascular calcifications. Small left pleural effusion. ABDOMEN/PELVIS: No abnormal hypermetabolic activity within the liver, pancreas, adrenal glands, or spleen. No hypermetabolic lymph nodes in the abdomen or pelvis. Incidental CT findings: Moderate atherosclerotic calcifications involving the aorta and branch vessels but no aneurysm. Scattered colonic diverticulosis. SKELETON: Diffuse scattered hypermetabolic bone lesions consistent with widespread metastatic bone disease. Sternal lesion has an SUV max of 3.42 Right sixth rib lesion has an SUV max of 4.59 T7 lesion has an SUV max of 5.26 Right iliac bone lesion has an SUV max of 5.89. Left hip/bus or trochanteric lesion has an SUV max of 4.39. Incidental CT findings: None. IMPRESSION: 1. Hypermetabolic left upper lobe pulmonary mass invading the left hilum consistent with primary lung neoplasm. 2. Hypermetabolic mediastinal and left hilar lymphadenopathy consistent with metastatic disease. 3. Diffuse scattered hypermetabolic bone lesions consistent with widespread metastatic bone disease. 4. No findings for metastatic disease involving the abdomen/pelvis. 5. Small left pleural effusion but  no hypermetabolic pleural lesions. 6. Aortic atherosclerosis. Aortic Atherosclerosis (ICD10-I70.0). Electronically Signed   By: MYRTIS Stammer M.D.   On: 12/16/2023 17:36   CT SUPER D CHEST WO CONTRAST Result Date: 12/16/2023 CLINICAL DATA:  Left upper lobe pulmonary nodule. EXAM: CT CHEST WITHOUT CONTRAST TECHNIQUE: Multidetector CT imaging of the chest was performed using thin slice collimation for electromagnetic bronchoscopy planning purposes, without intravenous contrast. RADIATION DOSE REDUCTION: This exam was performed according to the departmental dose-optimization program which includes  automated exposure control, adjustment of the mA and/or kV according to patient size and/or use of iterative reconstruction technique. COMPARISON:  11/07/2023 FINDINGS: Cardiovascular: The heart size is normal. No substantial pericardial effusion. Ascending thoracic aorta measures 4.1 cm diameter. Mild atherosclerotic calcification is noted in the wall of the thoracic aorta. Mediastinum/Nodes: Scattered normal and upper normal sized lymph nodes are seen in the mediastinum including 9 mm short axis right paratracheal node on 67/2. Calcified nodal tissue is seen in the left hilum with abnormal soft tissue density in the left suprahilar region encasing the left upper lobe bronchus. The esophagus has normal imaging features.There is no axillary lymphadenopathy. Lungs/Pleura: Centrilobular emphsyema noted. 3 mm right middle lobe nodule on image 130/4 is new in the interval. 5 mm subpleural right upper lobe nodule on 73/4 is stable. 4 mm right upper lobe nodule on 61/4 is new. 2 mm right upper lobe nodule on 41/4 has decreased from 4 mm previously. 5 mm subpleural right lower lobe nodule on 91/4 is stable. Soft tissue fullness in the right hilum and suprahilar region is progressive in the interval, encasing the bronchi to the apical in lingular segments of the left upper lobe. 6.7 x 3.5 cm masslike consolidative opacity in the posterior left upper lobe is new in the interval. This is associated with anteromedial left upper lobe volume loss adjacent to the mediastinum. Calcified granulomata noted left base. Small left pleural effusion is new in the interval. Upper Abdomen: No acute findings. 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 Musculoskeletal: No worrisome lytic or sclerotic osseous abnormality. IMPRESSION: 1. Progressive soft tissue fullness in the left hilum and suprahilar region, encasing the bronchi to the apical and lingular segments of the left upper lobe. This soft  tissue is confluent with a 6.7 x 3.5 cm masslike consolidative opacity in the posterior left upper lobe, new in the interval. Imaging features raise concern for neoplasm with volume loss in the posterior and anteromedial left upper lobe. 2. New small left pleural effusion. 3. Waxing and waning of small pulmonary nodules in the right lung. Continued close attention on follow-up recommended. 4. Aortic Atherosclerosis (ICD10-I70.0) and Emphysema (ICD10-J43.9). Electronically Signed   By: Camellia Candle M.D.   On: 12/16/2023 14:03    PERFORMANCE STATUS (ECOG) : 1 - Symptomatic but completely ambulatory  Review of Systems Unless otherwise noted, a complete review of systems is negative.  Physical Exam General: NAD Pulmonary: Unlabored Abdomen: soft, nontender, + bowel sounds GU: no suprapubic tenderness Extremities: trace RLE edema, positive Homan's sign, no joint deformities Skin: no rashes Neurological: nonfocal  IMPRESSION: PET scan 12/08/2023 showed a hypermetabolic left upper lobe pulmonary mass invading the left hilum with hypermetabolic mediastinal left hilar lymphadenopathy and diffuse scattered hypermetabolic bone lesions consistent with widespread metastatic bone disease.  Patient underwent bronchoscopy on 12/20/2018 with cytologies positive for adenocarcinoma of the lung.  Patient met with Dr. Babara on 12/25/2023 at which time he was unsure if he wished to proceed  with cancer treatment.  Patient is pending MRI of the brain to complete staging workup.  Today, met with patient and wife.  They were tearful as I discussed the impact of his newly diagnosed stage IV cancer.  Patient has not yet decided on whether he would want to pursue cancer treatment.  He says that his primary goal is to ensure that his quality of life is maximized and that he is comfortable.  Patient is pending MRI of the brain and he would like to discuss further with Dr. Babara once those results are known.  Symptomatically,  patient endorses ongoing headaches for the past month.  Denies visual changes.  Also has been having right calf pain and swelling for the past week.  Denies shortness of breath or chest pain.  Patient endorses poor oral intake.  He has had sustained weight loss.  Patient also endorses difficulty sleeping at night.  He is interested in sleep aid.  Will trial mirtazapine , which hopefully will help with both sleep and appetite.  Patient sent home with a MOST form to review.  He says that he has discussed decision making with his family.  He does not seem inclined to want aggressive care at end-of-life.  He is in process of completing advance directives.  PLAN: -Patient still considering decision making -Start Norco 5-325mg  every 6 hours as needed #30 -Ondansetron  as needed for nausea -Start mirtazapine  7.5 mg nightly -Daily bowel regimen to prevent opioid-induced constipation -Referrals to nutrition, social work, and [employer] -Will obtain stat MRI of the brain and right lower extremity Doppler -Follow-up 1 to 2 weeks  Case and plan discussed with Dr. Babara  Addendum: Dopplers showed right lower extremity DVT.  In light of patient's recent headache, he was sent for stat CT of the head prior to initiating anticoagulation.  CT shows scattered small skull lesions but no intracranial metastases or bleeding.  Discussed with Dr. Babara and prescription sent for Eliquis  starter pack.  Instructions given to the patient and wife.  He is pending MRI of the brain on 12/30/2023.   Patient expressed understanding and was in agreement with this plan. He also understands that He can call the clinic at any time with any questions, concerns, or complaints.     Time Total: 25 minutes  Visit consisted of counseling and education dealing with the complex and emotionally intense issues of symptom management and palliative care in the setting of serious and potentially life-threatening illness.Greater than 50%  of  this time was spent counseling and coordinating care related to the above assessment and plan.  Signed by: Fonda Mower, PhD, NP-C

## 2023-12-28 NOTE — Addendum Note (Signed)
 Addended by: Peggyann Bower on: 12/28/2023 01:51 PM   Modules accepted: Orders

## 2023-12-29 ENCOUNTER — Inpatient Hospital Stay: Payer: Medicare Other

## 2023-12-29 ENCOUNTER — Telehealth: Payer: Self-pay | Admitting: *Deleted

## 2023-12-29 NOTE — Progress Notes (Signed)
 The proposed treatment discussed in conference is for discussion purpose only and is not a binding recommendation.  The patients have not been physically examined, or presented with their treatment options.  Therefore, final treatment plans cannot be decided.

## 2023-12-29 NOTE — Telephone Encounter (Signed)
 The wife called today saying that she went to get the Eliquis  at the pharmacy and it was going to cost $800 and they could not afford that.  I spoke to the pharmacist and they suggested trying to get was a one-time 30-day free offer and I got that done and the wife can go over and get the prescription for.  I did tell the wife that she needs to come in 1 day next week so that they can speak with Sabrina and see if there is paperwork to try to get him more of the Eliquis  going forward.  The wife says that they have an appointment coming up next week so they will be there and she will do that

## 2023-12-29 NOTE — Progress Notes (Signed)
 [facility] Clinical Social Work  Initial Assessment   Joe Morton is a 73 y.o. year old male contacted by phone. Clinical Social Work was referred by medical provider, [employer], for assessment of psychosocial needs.   SDOH (Social Determinants of Health) assessments performed: No   SDOH Screenings   Food Insecurity: No Food Insecurity (12/25/2023)  Housing: Unknown (12/25/2023)  Transportation Needs: No Transportation Needs (12/25/2023)  Utilities: Not At Risk (12/25/2023)  Depression (PHQ2-9): Low Risk  (12/25/2023)  Financial Resource Strain: Low Risk  (12/25/2023)  Stress: No Stress Concern Present (12/25/2023)  Tobacco Use: High Risk (12/25/2023)  Health Literacy: Adequate Health Literacy (12/25/2023)     Distress Screen completed: No     No data to display            Family/Social Information:  Housing Arrangement: patient lives with his wife, Joe Morton. Family members/support persons in your life? Family does not live in the area. Transportation concerns: no  Employment: Retired   Tour Manager concerns: No Type of concern: None Food access concerns: no Religious or spiritual practice: Not known Advanced directives: No.  In the process of completing. Services Currently in place:  Medicare  Coping/ Adjustment to diagnosis: Patient understands treatment plan and what happens next? yes Concerns about diagnosis and/or treatment: Overwhelmed by information Patient reported stressors: Partner.  She is having difficulty adjusting to diagnosis. Hopes and/or priorities: Family Patient enjoys time with family/ friends Current coping skills/ strengths: Average or above average intelligence , Capable of independent living , Communication skills , Financial means , General fund of knowledge , and Supportive family/friends     SUMMARY: Current SDOH Barriers:  None per patient.  Clinical Social Work Clinical Goal(s):  No clinical social work goals at  this time  Interventions: Discussed common feeling and emotions when being diagnosed with cancer, and the importance of support during treatment Informed patient of the support team roles and support services at [facility] Provided CSW contact information and encouraged patient to call with any questions or concerns Provided patient with information about clinical social work role.  He will inform his wife of CSW services.   Follow Up Plan: Patient will contact CSW with any support or resource needs Patient verbalizes understanding of plan: Yes    Joe CHRISTELLA Au, LCSW Clinical Social Worker [facility]

## 2023-12-30 ENCOUNTER — Ambulatory Visit
Admission: RE | Admit: 2023-12-30 | Discharge: 2023-12-30 | Disposition: A | Discharge status: Home / Self Care | Payer: Medicare Other | Source: Ambulatory Visit | Attending: Hospice and Palliative Medicine | Admitting: Hospice and Palliative Medicine

## 2023-12-30 DIAGNOSIS — I6381 Other cerebral infarction due to occlusion or stenosis of small artery: Secondary | ICD-10-CM | POA: Diagnosis not present

## 2023-12-30 DIAGNOSIS — C7989 Secondary malignant neoplasm of other specified sites: Secondary | ICD-10-CM | POA: Diagnosis not present

## 2023-12-30 DIAGNOSIS — C3412 Malignant neoplasm of upper lobe, left bronchus or lung: Secondary | ICD-10-CM | POA: Diagnosis not present

## 2023-12-30 DIAGNOSIS — C3490 Malignant neoplasm of unspecified part of unspecified bronchus or lung: Secondary | ICD-10-CM | POA: Diagnosis not present

## 2023-12-30 DIAGNOSIS — C7951 Secondary malignant neoplasm of bone: Secondary | ICD-10-CM | POA: Diagnosis not present

## 2023-12-30 MED ORDER — GADOBUTROL 1 MMOL/ML IV SOLN
7.0000 mL | Freq: Once | INTRAVENOUS | Status: AC | PRN
  Administered 2023-12-30: 7 mL via INTRAVENOUS

## 2024-01-01 ENCOUNTER — Telehealth: Payer: Self-pay | Admitting: *Deleted

## 2024-01-01 ENCOUNTER — Observation Stay
Admission: EM | Admit: 2024-01-01 | Discharge: 2024-01-03 | Disposition: A | Discharge status: Home / Self Care | Payer: Medicare Other | Attending: Obstetrics and Gynecology | Admitting: Obstetrics and Gynecology

## 2024-01-01 ENCOUNTER — Ambulatory Visit: Payer: Medicare Other

## 2024-01-01 ENCOUNTER — Other Ambulatory Visit: Payer: Self-pay

## 2024-01-01 ENCOUNTER — Encounter: Payer: Medicare Other | Admitting: Hospice and Palliative Medicine

## 2024-01-01 DIAGNOSIS — C7951 Secondary malignant neoplasm of bone: Secondary | ICD-10-CM | POA: Diagnosis not present

## 2024-01-01 DIAGNOSIS — Z79899 Other long term (current) drug therapy: Secondary | ICD-10-CM | POA: Insufficient documentation

## 2024-01-01 DIAGNOSIS — F17210 Nicotine dependence, cigarettes, uncomplicated: Secondary | ICD-10-CM | POA: Diagnosis not present

## 2024-01-01 DIAGNOSIS — G4700 Insomnia, unspecified: Secondary | ICD-10-CM | POA: Insufficient documentation

## 2024-01-01 DIAGNOSIS — I6310 Cerebral infarction due to embolism of unspecified precerebral artery: Secondary | ICD-10-CM

## 2024-01-01 DIAGNOSIS — I6389 Other cerebral infarction: Secondary | ICD-10-CM

## 2024-01-01 DIAGNOSIS — I63133 Cerebral infarction due to embolism of bilateral carotid arteries: Secondary | ICD-10-CM | POA: Diagnosis not present

## 2024-01-01 DIAGNOSIS — Z7901 Long term (current) use of anticoagulants: Secondary | ICD-10-CM | POA: Insufficient documentation

## 2024-01-01 DIAGNOSIS — D72829 Elevated white blood cell count, unspecified: Secondary | ICD-10-CM | POA: Diagnosis not present

## 2024-01-01 DIAGNOSIS — I82401 Acute embolism and thrombosis of unspecified deep veins of right lower extremity: Secondary | ICD-10-CM | POA: Insufficient documentation

## 2024-01-01 DIAGNOSIS — I639 Cerebral infarction, unspecified: Principal | ICD-10-CM | POA: Insufficient documentation

## 2024-01-01 DIAGNOSIS — I10 Essential (primary) hypertension: Secondary | ICD-10-CM | POA: Insufficient documentation

## 2024-01-01 DIAGNOSIS — C3490 Malignant neoplasm of unspecified part of unspecified bronchus or lung: Secondary | ICD-10-CM | POA: Insufficient documentation

## 2024-01-01 DIAGNOSIS — R29818 Other symptoms and signs involving the nervous system: Secondary | ICD-10-CM | POA: Diagnosis present

## 2024-01-01 DIAGNOSIS — R748 Abnormal levels of other serum enzymes: Secondary | ICD-10-CM | POA: Diagnosis not present

## 2024-01-01 LAB — DIFFERENTIAL
Abs Immature Granulocytes: 0.19 10*3/uL — ABNORMAL HIGH (ref 0.00–0.07)
Basophils Absolute: 0.1 10*3/uL (ref 0.0–0.1)
Basophils Relative: 1 %
Eosinophils Absolute: 0.4 10*3/uL (ref 0.0–0.5)
Eosinophils Relative: 4 %
Immature Granulocytes: 2 %
Lymphocytes Relative: 10 %
Lymphs Abs: 1.2 10*3/uL (ref 0.7–4.0)
Monocytes Absolute: 1.4 10*3/uL — ABNORMAL HIGH (ref 0.1–1.0)
Monocytes Relative: 11 %
Neutro Abs: 9.2 10*3/uL — ABNORMAL HIGH (ref 1.7–7.7)
Neutrophils Relative %: 72 %

## 2024-01-01 LAB — CBC
HCT: 50.3 % (ref 39.0–52.0)
Hemoglobin: 16.9 g/dL (ref 13.0–17.0)
MCH: 31.8 pg (ref 26.0–34.0)
MCHC: 33.6 g/dL (ref 30.0–36.0)
MCV: 94.5 fL (ref 80.0–100.0)
Platelets: 155 10*3/uL (ref 150–400)
RBC: 5.32 MIL/uL (ref 4.22–5.81)
RDW: 12.9 % (ref 11.5–15.5)
WBC: 12.6 10*3/uL — ABNORMAL HIGH (ref 4.0–10.5)
nRBC: 0 % (ref 0.0–0.2)

## 2024-01-01 LAB — ETHANOL: Alcohol, Ethyl (B): <10 mg/dL (ref <10)

## 2024-01-01 LAB — COMPREHENSIVE METABOLIC PANEL
ALT: 31 U/L (ref 0–44)
AST: 44 U/L — ABNORMAL HIGH (ref 15–41)
Albumin: 3.6 g/dL (ref 3.5–5.0)
Alkaline Phosphatase: 769 U/L — ABNORMAL HIGH (ref 38–126)
Anion gap: 12 (ref 5–15)
BUN: 17 mg/dL (ref 8–23)
CO2: 27 mmol/L (ref 22–32)
Calcium: 10.5 mg/dL — ABNORMAL HIGH (ref 8.9–10.3)
Chloride: 100 mmol/L (ref 98–111)
Creatinine, Ser: 1.11 mg/dL (ref 0.61–1.24)
GFR, Estimated: >60 mL/min (ref >60)
Glucose, Bld: 110 mg/dL — ABNORMAL HIGH (ref 70–99)
Potassium: 4.1 mmol/L (ref 3.5–5.1)
Sodium: 139 mmol/L (ref 135–145)
Total Bilirubin: 0.9 mg/dL (ref 0.0–1.2)
Total Protein: 7.2 g/dL (ref 6.5–8.1)

## 2024-01-01 LAB — APTT: aPTT: 38 s — ABNORMAL HIGH (ref 24–36)

## 2024-01-01 LAB — PROTIME-INR
INR: 1.6 — ABNORMAL HIGH (ref 0.8–1.2)
Prothrombin Time: 19.5 s — ABNORMAL HIGH (ref 11.4–15.2)

## 2024-01-01 MED ORDER — ACETAMINOPHEN 325 MG PO TABS: 650.0000 mg | ORAL_TABLET | Freq: Four times a day (QID) | ORAL | Status: DC | PRN

## 2024-01-01 MED ORDER — ONDANSETRON HCL 4 MG/2ML IJ SOLN: 4.0000 mg | Freq: Four times a day (QID) | INTRAMUSCULAR | Status: DC | PRN

## 2024-01-01 MED ORDER — ACETAMINOPHEN 650 MG RE SUPP: 650.0000 mg | Freq: Four times a day (QID) | RECTAL | Status: DC | PRN

## 2024-01-01 MED ORDER — MORPHINE SULFATE (PF) 2 MG/ML IV SOLN: 1.0000 mg | INTRAVENOUS | Status: DC | PRN

## 2024-01-01 MED ORDER — ONDANSETRON HCL 4 MG PO TABS: 4.0000 mg | ORAL_TABLET | Freq: Four times a day (QID) | ORAL | Status: DC | PRN

## 2024-01-01 MED ORDER — SODIUM CHLORIDE 0.9% FLUSH: 3.0000 mL | Freq: Once | INTRAVENOUS | Status: DC

## 2024-01-01 MED ORDER — SENNOSIDES-DOCUSATE SODIUM 8.6-50 MG PO TABS
1.0000 | ORAL_TABLET | Freq: Every evening | ORAL | Status: DC | PRN
  Administered 2024-01-02: 1 via ORAL
  Filled 2024-01-01: qty 1

## 2024-01-01 MED ORDER — MIRTAZAPINE 15 MG PO TABS
7.5000 mg | ORAL_TABLET | Freq: Every day | ORAL | Status: DC
  Administered 2024-01-01 – 2024-01-02 (×2): 7.5 mg via ORAL
  Filled 2024-01-01 (×2): qty 1

## 2024-01-01 MED ORDER — HYDROCODONE-ACETAMINOPHEN 5-325 MG PO TABS
1.0000 | ORAL_TABLET | ORAL | Status: DC | PRN
  Administered 2024-01-02 (×2): 2 via ORAL
  Filled 2024-01-01 (×2): qty 2

## 2024-01-01 MED ORDER — GUAIFENESIN-DM 100-10 MG/5ML PO SYRP
5.0000 mL | ORAL_SOLUTION | ORAL | Status: DC | PRN
  Administered 2024-01-01 – 2024-01-02 (×4): 5 mL via ORAL
  Filled 2024-01-01 (×4): qty 10

## 2024-01-01 MED ORDER — SODIUM CHLORIDE 0.9% FLUSH
3.0000 mL | Freq: Two times a day (BID) | INTRAVENOUS | Status: DC
  Administered 2024-01-01 – 2024-01-03 (×5): 3 mL via INTRAVENOUS

## 2024-01-01 MED ORDER — HEPARIN (PORCINE) 25000 UT/250ML-% IV SOLN
1050.0000 [IU]/h | INTRAVENOUS | Status: DC
  Administered 2024-01-01: 900 [IU]/h via INTRAVENOUS
  Administered 2024-01-03: 1000 [IU]/h via INTRAVENOUS
  Filled 2024-01-01 (×2): qty 250

## 2024-01-01 NOTE — ED Notes (Signed)
 Pt ambulatory to tx room and denies any issues. Pt awaiting admission to hospital. Wife at bedside.

## 2024-01-01 NOTE — Progress Notes (Addendum)
 PHARMACY - ANTICOAGULATION CONSULT NOTE  Pharmacy Consult for heparin  infusion Indication: RLE DVT ISO CVA  No Known Allergies  Patient Measurements:   Heparin  Dosing Weight: 69.7 kg  Vital Signs: Temp: 98 F (36.7 C) (02/10 1027) Temp Source: Oral (02/10 1027) BP: 110/79 (02/10 1230) Pulse Rate: 88 (02/10 1230)  Labs: Recent Labs    01/01/24 1029  HGB 16.9  HCT 50.3  PLT 155  APTT 38*  LABPROT 19.5*  INR 1.6*  CREATININE 1.11    Estimated Creatinine Clearance: 59.1 mL/min (by C-G formula based on SCr of 1.11 mg/dL).   Medical History: Past Medical History:  Diagnosis Date   Emphysema lung (HCC)    Hypertension     Assessment: 73 y/o M w/ PMH of stage IV lung cancer, smoker, HTN, insomnia who presents after being told to come to the hospital by his oncologist. He was on apixaban  prior to arrival with last dose   01/01/24 at approximately 0900  Baseline labs: H&H/PLT wnl, aPTT 38s, INR 1.6  Goal of Therapy:  Heparin  level 0.3-0.5 units/ml aPTT 66 - 85 seconds Monitor platelets by anticoagulation protocol: Yes   Plan:  ---Start heparin  infusion at 900 units/hr at 2100 (no loading dose ISO indication and recent apixaban  administration) ---Check aPTT level in 8 hours and heparin  level at least once daily while on heparin  (we will use aPTT to guide therapy until aPTT and heparin  level correlate) ---Continue to monitor H&H and platelets  Adalberto Acton 01/01/2024,2:13 PM

## 2024-01-01 NOTE — Telephone Encounter (Signed)
 Asked for Joe Morton to call the radiology.I asked him to call and he did.

## 2024-01-01 NOTE — Progress Notes (Signed)
 Neurology brief note  73 yo man admitted for incidental findings of multifocal acute infarcts on outpatient MRI brain 12/30/23 that was ordered for the purpose of staging patient's NSCLC. He was also diagnosed with DVT last week and had been taking eliquis  for the past 3 days. 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 He is asymptomatic from this and has NIHSS = 0. Overall burden of acute infarct on imaging is relatively small. Given this and the facts that he has known DVT and is hypercoagulable in the setting of malignancy recommend continuing anticoagulation with heparin  at this time. The risk of withholding anticoagulation in this setting outweighs the risk of hemorrhagic conversion if it continued.   Recommend holding eliquis  and starting heparin  gtt stroke protocol low goal no bolus  D/w Dr. Broadus Canes  Full consult note to follow.  Greg Leaks, MD [facility] [phone]  If 7pm- 7am, please page neurology on call as listed in AMION.

## 2024-01-01 NOTE — Care Management CC44 (Signed)
 Condition Code 44 Documentation Completed  Patient Details  Name: Joe Morton MRN: [id number] Date of Birth: 06/23/1951   Condition Code 44 given:  Yes Patient signature on Condition Code 44 notice:  Yes Documentation of 2 MD's agreement:  Yes Code 44 added to claim:  Yes  I, Tilmon Font, LCSW, verbally reviewed observation notice.  01/01/2024, 4:16 PM   Tilmon Font, LCSW 01/01/2024, 4:16 PM

## 2024-01-01 NOTE — H&P (Addendum)
 History and Physical    Joe Morton ZOX:[id number] DOB: Sep 05, 1951 DOA: 01/01/2024  PCP: Benedetta Bradley, MD  Patient coming from: home  I have personally briefly reviewed patient's old medical records in [facility]  Chief Complaint: home   HPI: 73 y/o M w/ PMH of stage IV lung cancer, smoker, HTN, insomnia who presents after being told to come to the hospital by his oncologist, Dr. Wilhelmenia Joe Morton, for CVA found on outpatient MRI. Pt denies any deficits including difficulty walking, talking, swallowing, weakness. Pt does not use a cane, walker or wheelchair. Pt denies any fever, chills, sweating, chest pain, shortness of breath, nausea, vomiting, abd pain, dysuria, urinary urgency, urinary frequency, diarrhea or constipation. Pt smokes about 1 ppd x 33 years. Also, pt c/o intermittent productive cough x 6 months.   Review of Systems: As per HPI otherwise 14 point review of systems negative.   Past Medical History:  Diagnosis Date   Emphysema lung (HCC)    Hypertension     Past Surgical History:  Procedure Laterality Date   BRONCHIAL BIOPSY  12/21/2023   Procedure: BRONCHIAL BIOPSIES;  Surgeon: Annitta Kindler, MD;  Location: [facility];  Service: Pulmonary;;   BRONCHIAL NEEDLE ASPIRATION BIOPSY  12/21/2023   Procedure: BRONCHIAL NEEDLE ASPIRATION BIOPSIES;  Surgeon: Annitta Kindler, MD;  Location: [facility];  Service: Pulmonary;;   BRONCHIAL WASHINGS  12/21/2023   Procedure: BRONCHIAL WASHINGS;  Surgeon: Annitta Kindler, MD;  Location: [facility];  Service: Pulmonary;;   COLONOSCOPY     ENDOBRONCHIAL ULTRASOUND Bilateral 12/21/2023   Procedure: ENDOBRONCHIAL ULTRASOUND;  Surgeon: Annitta Kindler, MD;  Location: [facility];  Service: Pulmonary;  Laterality: Bilateral;   FINE NEEDLE ASPIRATION  12/21/2023   Procedure: FINE NEEDLE ASPIRATION (FNA) LINEAR;  Surgeon: Annitta Kindler, MD;  Location: [facility];  Service: Pulmonary;;   INGUINAL HERNIA REPAIR  Left 09/17/2018   Procedure: OPEN LEFT INGUINAL HERNIA REPAIR WITH MESH;  Surgeon: Jerryl Morin, MD;  Location: [facility] OR;  Service: General;  Laterality: Left;  GENERAL AND LMA   INSERTION OF MESH Left 09/17/2018   Procedure: INSERTION OF MESH;  Surgeon: Jerryl Morin, MD;  Location: [facility] OR;  Service: General;  Laterality: Left;  GENERAL AND LMA     reports that he has been smoking cigarettes. He started smoking about 44 years ago. He has a 44.1 pack-year smoking history. He has never used smokeless tobacco. He reports current alcohol use of about 6.0 standard drinks of alcohol per week. He reports that he does not use drugs.  No Known Allergies  History reviewed. No pertinent family history.   Prior to Admission medications   Medication Sig Start Date End Date Taking? Authorizing Provider  acetaminophen  (TYLENOL ) 80 MG chewable tablet Chew 500 mg by mouth every 6 (six) hours as needed.    [provider]  amLODipine (NORVASC) 5 MG tablet Take 2.5 mg by mouth daily. 11/23/23   [provider]  APIXABAN  (ELIQUIS ) VTE STARTER PACK (10MG  AND 5MG ) Take as directed on package: start with two-5mg  tablets twice daily for 7 days. On day 8, switch to one-5mg  tablet twice daily. 12/28/23   Borders, Joshua R, NP  HYDROcodone -acetaminophen  (NORCO) 5-325 MG tablet Take 1 tablet by mouth every 6 (six) hours as needed for moderate pain (pain score 4-6). 12/28/23   Borders, Carlene Che, NP  mirtazapine  (REMERON ) 7.5 MG tablet Take 1 tablet (7.5 mg total) by mouth at bedtime. 12/28/23   Borders, Carlene Che, NP  ondansetron  (ZOFRAN )  8 MG tablet Take 1 tablet (8 mg total) by mouth every 8 (eight) hours as needed for nausea or vomiting. 12/28/23   Peggyann Bower, NP    Physical Exam: Vitals:   01/01/24 1027 01/01/24 1130 01/01/24 1200 01/01/24 1230  BP: 120/87 101/79 105/77 110/79  Pulse: (!) 104 86 83 88  Resp: 20  (!) 22 19  Temp: 98 F (36.7 C)     TempSrc: Oral     SpO2: 98% 94% 94% 92%     Constitutional: NAD, calm, comfortable Vitals:   01/01/24 1027 01/01/24 1130 01/01/24 1200 01/01/24 1230  BP: 120/87 101/79 105/77 110/79  Pulse: (!) 104 86 83 88  Resp: 20  (!) 22 19  Temp: 98 F (36.7 C)     TempSrc: Oral     SpO2: 98% 94% 94% 92%   Eyes: PERRL, lids and conjunctivae normal ENMT: Mucous membranes are moist. Normal dentition.  Neck: normal, supple Respiratory: clear to auscultation bilaterally, no wheezing, no crackles. Normal respiratory effort. No accessory muscle use.  Cardiovascular: Regular rate and rhythm, no rubs / gallops.  Abdomen: soft, no tenderness, ND, normal bowel sounds  Musculoskeletal: no clubbing / cyanosis. No joint deformity upper and lower extremities. Good ROM. Skin: no rashes, lesions, ulcers.  Neurologic: CN 2-12 grossly intact. Strength 5/5 in all 4.  Psychiatric: Normal judgment and insight. Alert and oriented x 3. Normal mood.     Labs on Admission: I have personally reviewed following labs and imaging studies  CBC: Recent Labs  Lab 01/01/24 1029  WBC 12.6*  NEUTROABS 9.2*  HGB 16.9  HCT 50.3  MCV 94.5  PLT 155   Basic Metabolic Panel: Recent Labs  Lab 01/01/24 1029  NA 139  K 4.1  CL 100  CO2 27  GLUCOSE 110*  BUN 17  CREATININE 1.11  CALCIUM 10.5*   GFR: Estimated Creatinine Clearance: 59.1 mL/min (by C-G formula based on SCr of 1.11 mg/dL). Liver Function Tests: Recent Labs  Lab 01/01/24 1029  AST 44*  ALT 31  ALKPHOS 769*  BILITOT 0.9  PROT 7.2  ALBUMIN 3.6   No results for input(s): "LIPASE", "AMYLASE" in the last 168 hours. No results for input(s): "AMMONIA" in the last 168 hours. Coagulation Profile: Recent Labs  Lab 01/01/24 1029  INR 1.6*   Cardiac Enzymes: No results for input(s): "CKTOTAL", "CKMB", "CKMBINDEX", "TROPONINI" in the last 168 hours. BNP (last 3 results) No results for input(s): "PROBNP" in the last 8760 hours. HbA1C: No results for input(s): "HGBA1C" in the last 72  hours. CBG: No results for input(s): "GLUCAP" in the last 168 hours. Lipid Profile: No results for input(s): "CHOL", "HDL", "LDLCALC", "TRIG", "CHOLHDL", "LDLDIRECT" in the last 72 hours. Thyroid Function Tests: No results for input(s): "TSH", "T4TOTAL", "FREET4", "T3FREE", "THYROIDAB" in the last 72 hours. Anemia Panel: No results for input(s): "VITAMINB12", "FOLATE", "FERRITIN", "TIBC", "IRON", "RETICCTPCT" in the last 72 hours. Urine analysis: No results found for: "COLORURINE", "APPEARANCEUR", "LABSPEC", "PHURINE", "GLUCOSEU", "HGBUR", "BILIRUBINUR", "KETONESUR", "PROTEINUR", "UROBILINOGEN", "NITRITE", "LEUKOCYTESUR"  Radiological Exams on Admission: MR Brain W Wo Contrast Result Date: 12/30/2023 CLINICAL DATA:  Non-small cell lung cancer, staging. EXAM: MRI HEAD WITHOUT AND WITH CONTRAST TECHNIQUE: Multiplanar, multiecho pulse sequences of the brain and surrounding structures were obtained without and with intravenous contrast. CONTRAST:  7mL GADAVIST  GADOBUTROL  1 MMOL/ML IV SOLN COMPARISON:  CT head without contrast 12/28/2023. 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 Additional  punctate cortical infarcts are present just superior to the largest area within the pre frontal cortex. Three separate punctate white matter scratched at 3 separate punctate acute white matter infarcts are present in the right corona radiata. A punctate area of acute infarction is present in the left lentiform nucleus. A punctate acute cortical infarct is present the left occipital pole. A 5 mm acute nonhemorrhagic infarct is present in the inferior right cerebellum. A punctate infarct is present inferiorly in the left cerebellum. Vascular: Flow is present in the major intracranial arteries. Skull and upper cervical spine: Craniocervical junction is normal. Multiple enhancing metastases are present in the upper cervical spine and scattered throughout the  skull. No pathologic fractures are present. Sinuses/Orbits: The paranasal sinuses and mastoid air cells are clear. The globes and orbits are within normal limits. IMPRESSION: 1. 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 2. Additional small infarcts involving the more superior left frontal lobe, right corona radiata, left basal ganglia and bilateral cerebellum. This suggests a central source. 3. 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 No pathologic fractures are present. Electronically Signed   By: Audree Leas M.D.   On: 12/30/2023 19:42    EKG: Independently reviewed.   Assessment/Plan Principal Problem:   CVA (cerebral vascular accident) (HCC)   CVA: acute/subacute nonhemorrhagic infarct w/ additional small infarcts in left frontal lobe, right corona radiata, left basal ganglia & b/l cerebellum. Continue on tele. Continue w/ neuro checks. PT/OT consulted. Pt passed bedside swallow so need for speech consult currently. Neuro consulted, Dr. Doretta Gant  RLE DVT: dx outpatient was taking eliquis . Holding eliquis , secondary to above. Start heparin  gtt stroke protocol low goal no bolus as per neuro   Stage IV lung cancer: recently dx. MRI brain shows multiple enhancing metastases in upper C-spine & skull. Onco consulted, Dr. Wilhelmenia Joe Morton  Smoker: smokes 1ppd x 33 years. Pt declines a nicotine patch   Elevated alkaline phosphatase: etiology unclear. ? Metastatic to liver. Will continue to monitor  Leukocytosis: likely reactive. No signs/symptoms of infection currently   HTN: pt stopped taking his amlodipine himself.  Insomnia: continue on home dose of remeron    DVT prophylaxis: SCDs Code Status: DNR Family Communication: discussed pt's care w/ pt's wife at bedside and answered her questions  Disposition Plan: likely d/c back home. PT/OT consulted  Consults called: Neuro, Dr. Doretta Gant. Onco, Dr. Wilhelmenia Joe Morton   Admission status: observation    Alphonsus Jeans MD [facility]  If 7PM-7AM, please contact night-coverage [email or web address]  01/01/2024, 1:52 PM

## 2024-01-01 NOTE — ED Triage Notes (Signed)
 Pt to ED via POV from home. Pt seen and MRI that showed acute nonhemorrhagic infarct and sent for stroke work up/admission. Pt was also recently seen for DVT. 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 Not currently on chemo/radiation. Pt denies speech changes, blurry vision or numbness/tingling.   Pt is on blood thinners and did take them this am.

## 2024-01-01 NOTE — Consult Note (Signed)
 Hematology/Oncology Consult note [phone] [phone]      Patient Care Team: Benedetta Bradley, MD as PCP - General (Internal Medicine) Timmy Forbes, MD as Consulting Physician (Oncology) Drake Gens, RN as Oncology Nurse Navigator   Name of the patient: Joe Morton  [id number]  Oct 08, 1951   REASON FOR COSULTATION:   History of presenting illness-  73 y.o. male with PMH listed at below who presents to ER per my instruction due to findings of acute/subacute nonhemorrhagic infarcts, additional small multiple infarcts in brain.  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  Molecular testing and PD-L1 are pending.  Patient is undecided whether he would like to proceed with cancer treatments.  Patient has been complaining severe headache. 12/28/2023 CT head with and without contrast showed no evidence of intracranial metastasis.  Scattered small skull lesions.  Right lower extremity DVT workup for right calf pain showed acute right lower extremity DVT with popliteal and calf veins.  Patient was recommended to start on Eliquis  however due to high co-pay, he has not able to start yet.  12/30/2023 MRI brain without contrast showed  1. 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 2. Additional small infarcts involving the more superior left frontal lobe, right corona radiata, left basal ganglia and bilateral cerebellum. This suggests a central source. 3. 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 No pathologic fractures are present.  Patient denies any neurologic deficit.  He is currently admitted for embolic stroke workup.  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) 12/25/2023    Priority: Medium    Goals of care, counseling/discussion 12/25/2023    Priority: Low   CVA (cerebral vascular accident) (HCC) 01/01/2024     Past Medical History:  Diagnosis Date   Emphysema lung (HCC)    Hypertension      Past Surgical History:  Procedure Laterality Date   BRONCHIAL BIOPSY  12/21/2023   Procedure: BRONCHIAL BIOPSIES;  Surgeon: Annitta Kindler, MD;  Location: [facility];  Service: Pulmonary;;   BRONCHIAL NEEDLE ASPIRATION BIOPSY  12/21/2023   Procedure: BRONCHIAL NEEDLE ASPIRATION BIOPSIES;  Surgeon: Annitta Kindler, MD;  Location: [facility];  Service: Pulmonary;;   BRONCHIAL WASHINGS  12/21/2023   Procedure: BRONCHIAL WASHINGS;  Surgeon: Annitta Kindler, MD;  Location: [facility];  Service: Pulmonary;;   COLONOSCOPY     ENDOBRONCHIAL ULTRASOUND Bilateral 12/21/2023   Procedure: ENDOBRONCHIAL ULTRASOUND;  Surgeon: Annitta Kindler, MD;  Location: [facility];  Service: Pulmonary;  Laterality: Bilateral;   FINE NEEDLE ASPIRATION  12/21/2023   Procedure: FINE NEEDLE ASPIRATION (FNA) LINEAR;  Surgeon: Annitta Kindler, MD;  Location: [facility];  Service: Pulmonary;;   INGUINAL HERNIA REPAIR Left 09/17/2018   Procedure: OPEN LEFT INGUINAL HERNIA REPAIR WITH MESH;  Surgeon: Jerryl Morin, MD;  Location: [facility];  Service: General;  Laterality: Left;  GENERAL AND LMA   INSERTION OF MESH Left 09/17/2018   Procedure: INSERTION OF MESH;  Surgeon: Jerryl Morin, MD;  Location: [facility] OR;  Service: General;  Laterality: Left;  GENERAL AND LMA    Social History   Socioeconomic History   Marital status: Married  Spouse name: Not on file   Number of children: Not on file   Years of education: Not on file   Highest education level: Not on file  Occupational History   Not on file  Tobacco Use   Smoking status: Every Day    Current packs/day: 1.00    Average  packs/day: 1 pack/day for 44.1 years (44.1 ttl pk-yrs)    Types: Cigarettes    Start date: 49   Smokeless tobacco: Never   Tobacco comments:    Smoking 3 cigarettes a day x 2 weeks   Vaping Use   Vaping status: Never Used  Substance and Sexual Activity   Alcohol use: Yes    Alcohol/week: 6.0 standard drinks of alcohol    Types: 6 Standard drinks or equivalent per week    Comment: occasionally   Drug use: No   Sexual activity: Not on file  Other Topics Concern   Not on file  Social History Narrative   Not on file   Social Drivers of Health   Financial Resource Strain: Low Risk  (12/25/2023)   Overall Financial Resource Strain (CARDIA)    Difficulty of Paying Living Expenses: Not very hard  Food Insecurity: No Food Insecurity (01/01/2024)   Hunger Vital Sign    Worried About Running Out of Food in the Last Year: Never true    Ran Out of Food in the Last Year: Never true  Transportation Needs: No Transportation Needs (01/01/2024)   PRAPARE - Administrator, Civil Service (Medical): No    Lack of Transportation (Non-Medical): No  Physical Activity: Not on file  Stress: No Stress Concern Present (12/25/2023)   [employer] of Occupational Health - Occupational Stress Questionnaire    Feeling of Stress : Only a little  Social Connections: Moderately Integrated (01/01/2024)   Social Connection and Isolation Panel [NHANES]    Frequency of Communication with Friends and Family: Three times a week    Frequency of Social Gatherings with Friends and Family: Once a week    Attends Religious Services: 1 to 4 times per year    Active Member of [employer] or Organizations: No    Attends Banker Meetings: Never    Marital Status: Married  Catering manager Violence: Not At Risk (01/01/2024)   Humiliation, Afraid, Rape, and Kick questionnaire    Fear of Current or Ex-Partner: No    Emotionally Abused: No    Physically Abused: No    Sexually Abused: No     History  reviewed. No pertinent family history.   Current Facility-Administered Medications:    acetaminophen  (TYLENOL ) tablet 650 mg, 650 mg, Oral, Q6H PRN **OR** acetaminophen  (TYLENOL ) suppository 650 mg, 650 mg, Rectal, Q6H PRN, Alphonsus Jeans, MD   guaiFENesin -dextromethorphan  (ROBITUSSIN DM) 100-10 MG/5ML syrup 5 mL, 5 mL, Oral, Q4H PRN, Alphonsus Jeans, MD, 5 mL at 01/01/24 2137   heparin  ADULT infusion 100 units/mL (25000 units/250mL), 900 Units/hr, Intravenous, Continuous, Adalberto Acton, [facility], Last Rate: 9 mL/hr at 01/01/24 2129, 900 Units/hr at 01/01/24 2129   HYDROcodone -acetaminophen  (NORCO/VICODIN) 5-325 MG per tablet 1-2 tablet, 1-2 tablet, Oral, Q4H PRN, Alphonsus Jeans, MD   mirtazapine  (REMERON ) tablet 7.5 mg, 7.5 mg, Oral, QHS, Broadus Canes, Jamiese M, MD, 7.5 mg at 01/01/24 2137   morphine  (PF) 2 MG/ML injection 1 mg, 1 mg, Intravenous, Q4H PRN, Alphonsus Jeans, MD   ondansetron  (ZOFRAN ) tablet 4 mg, 4 mg, Oral, Q6H PRN **OR** ondansetron  (ZOFRAN ) injection 4  mg, 4 mg, Intravenous, Q6H PRN, Alphonsus Jeans, MD   senna-docusate (Senokot-S) tablet 1 tablet, 1 tablet, Oral, QHS PRN, Alphonsus Jeans, MD   sodium chloride  flush (NS) 0.9 % injection 3 mL, 3 mL, Intravenous, Q12H, Alphonsus Jeans, MD, 3 mL at 01/01/24 2129  Review of Systems  Constitutional:  Positive for fatigue. Negative for appetite change, chills and fever.  HENT:   Negative for hearing loss and voice change.   Eyes:  Negative for eye problems and icterus.  Respiratory:  Positive for cough. Negative for chest tightness and shortness of breath.   Cardiovascular:  Negative for chest pain and leg swelling.  Gastrointestinal:  Negative for abdominal distention and abdominal pain.  Endocrine: Negative for hot flashes.  Genitourinary:  Negative for difficulty urinating, dysuria and frequency.   Musculoskeletal:  Negative for arthralgias.       Right calf pain/DVT  Skin:  Negative for itching and  rash.  Neurological:  Negative for light-headedness and numbness.  Hematological:  Negative for adenopathy. Does not bruise/bleed easily.  Psychiatric/Behavioral:  Negative for confusion.     PHYSICAL EXAM Vitals:   01/01/24 1230 01/01/24 1400 01/01/24 1639 01/01/24 1948  BP: 110/79  111/82 115/84  Pulse: 88  98 97  Resp: 19  18 18   Temp:   97.7 F (36.5 C) 100.1 F (37.8 C)  TempSrc:      SpO2: 92%  93% 94%  Height:  5\' 8"  (1.727 m)     Physical Exam Constitutional:      General: He is not in acute distress.    Appearance: He is not diaphoretic.  HENT:     Head: Normocephalic and atraumatic.     Nose: Nose normal.  Eyes:     General: No scleral icterus. Cardiovascular:     Rate and Rhythm: Normal rate and regular rhythm.  Pulmonary:     Effort: Pulmonary effort is normal. No respiratory distress.     Breath sounds: No wheezing.  Abdominal:     General: There is no distension.     Palpations: Abdomen is soft.     Tenderness: There is no abdominal tenderness.  Musculoskeletal:        General: Normal range of motion.     Cervical back: Normal range of motion and neck supple.  Skin:    General: Skin is warm and dry.     Findings: No erythema.  Neurological:     Mental Status: He is alert and oriented to person, place, and time. Mental status is at baseline.     Motor: No abnormal muscle tone.  Psychiatric:        Mood and Affect: Affect normal.       LABORATORY STUDIES    Latest Ref Rng & Units 01/01/2024   10:29 AM 12/25/2023   10:32 AM 12/21/2023    6:30 AM  CBC  WBC 4.0 - 10.5 K/uL 12.6  11.2  9.4   Hemoglobin 13.0 - 17.0 g/dL 16.1  09.6  04.5   Hematocrit 39.0 - 52.0 % 50.3  49.8  46.9   Platelets 150 - 400 K/uL 155  204  175       Latest Ref Rng & Units 01/01/2024   10:29 AM 12/25/2023   10:32 AM 12/21/2023    6:30 AM  CMP  Glucose 70 - 99 mg/dL 409  811  914   BUN 8 - 23 mg/dL 17  11  8    Creatinine 0.61 -  1.24 mg/dL 1.61  0.96  0.45   Sodium 135 -  145 mmol/L 139  130  131   Potassium 3.5 - 5.1 mmol/L 4.1  4.4  4.1   Chloride 98 - 111 mmol/L 100  96  95   CO2 22 - 32 mmol/L 27  26  24    Calcium 8.9 - 10.3 mg/dL 40.9  9.5  9.6   Total Protein 6.5 - 8.1 g/dL 7.2  7.3    Total Bilirubin 0.0 - 1.2 mg/dL 0.9  0.6    Alkaline Phos 38 - 126 U/L 769  609    AST 15 - 41 U/L 44  26    ALT 0 - 44 U/L 31  21       RADIOGRAPHIC STUDIES: I have personally reviewed the radiological images as listed and agreed with the findings in the report. MR Brain W Wo Contrast Result Date: 12/30/2023 CLINICAL DATA:  Non-small cell lung cancer, staging. EXAM: MRI HEAD WITHOUT AND WITH CONTRAST TECHNIQUE: Multiplanar, multiecho pulse sequences of the brain and surrounding structures were obtained without and with intravenous contrast. CONTRAST:  7mL GADAVIST  GADOBUTROL  1 MMOL/ML IV SOLN COMPARISON:  CT head without contrast 12/28/2023. 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 Additional punctate cortical infarcts are present just superior to the largest area within the pre frontal cortex. Three separate punctate white matter scratched at 3 separate punctate acute white matter infarcts are present in the right corona radiata. A punctate area of acute infarction is present in the left lentiform nucleus. A punctate acute cortical infarct is present the left occipital pole. A 5 mm acute nonhemorrhagic infarct is present in the inferior right cerebellum. A punctate infarct is present inferiorly in the left cerebellum. Vascular: Flow is present in the major intracranial arteries. Skull and upper cervical spine: Craniocervical junction is normal. 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 No pathologic fractures are present. Sinuses/Orbits: The paranasal sinuses and mastoid air cells are clear. The globes and orbits are within normal limits.  IMPRESSION: 1. 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 2. Additional small infarcts involving the more superior left frontal lobe, right corona radiata, left basal ganglia and bilateral cerebellum. This suggests a central source. 3. 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 No pathologic fractures are present. Electronically Signed   By: Audree Leas M.D.   On: 12/30/2023 19:42   CT HEAD W & WO CONTRAST ( ) Result Date: 12/28/2023 CLINICAL DATA:  Brain metastases suspected. Newly diagnosed lung cancer. Headaches. EXAM: CT HEAD WITHOUT AND WITH CONTRAST TECHNIQUE: Contiguous axial images were obtained from the base of the skull through the vertex without and with intravenous contrast. RADIATION DOSE REDUCTION: This exam was performed according to the departmental dose-optimization program which includes automated exposure control, adjustment of the mA and/or kV according to patient size and/or use of iterative reconstruction technique. CONTRAST:  75mL OMNIPAQUE  IOHEXOL  300 MG/ML  SOLN COMPARISON:  None Available. FINDINGS: Brain: There is no evidence of an acute infarct, intracranial hemorrhage, mass, midline shift, or extra-axial fluid collection. Cerebral volume is within normal limits for age. The ventricles are normal in size. Cerebral white matter hypodensities are nonspecific but compatible with mild chronic small vessel ischemic disease. No abnormal enhancement is identified. Vascular: The dural venous sinuses and large intracranial arteries are grossly  patent. Skull: No fracture.  Scattered small skull lesions. Sinuses/Orbits: Visualized paranasal sinuses and mastoid air cells are clear. Unremarkable orbits. Other: None. IMPRESSION: 1. No evidence of intracranial metastases. 2. Scattered small skull lesions, suspicious for metastases given evidence of widespread osseous metastases on last  month's PET-CT. 3. Mild chronic small vessel ischemic disease. Electronically Signed   By: Aundra Lee M.D.   On: 12/28/2023 15:13   US  Venous Img Lower Unilateral Right Result Date: 12/28/2023 CLINICAL DATA:  RLE edema, calf pain, lung cancer EXAM: RIGHT LOWER EXTREMITY VENOUS DOPPLER ULTRASOUND TECHNIQUE: Gray-scale sonography with compression, as well as color and duplex ultrasound, were performed to evaluate the deep venous system(s) from the level of the common femoral vein through the popliteal and proximal calf veins. COMPARISON:  PET-CT, 12/08/2023 FINDINGS: VENOUS Normal compressibility of the common femoral, superficial femoral, as well as the visualized calf veins. Visualized portions of profunda femoral vein and great saphenous vein unremarkable. Heterogeneously-echogenic, occlusive filling defects within and noncompressibility of the imaged portions of the RIGHT popliteal as well as the imaged calf veins. Limited views of the contralateral common femoral vein are unremarkable. OTHER No evidence of superficial thrombophlebitis or abnormal fluid collection. Limitations: none IMPRESSION: Acute RIGHT lower extremity DVT within the popliteal and calf veins. Art Largo, MD [facility] Electronically Signed   By: Art Largo M.D.   On: 12/28/2023 12:12   DG Chest Port 1 View Result Date: 12/21/2023 CLINICAL DATA:  Status post bronchoscopy. EXAM: PORTABLE CHEST 1 VIEW COMPARISON:  September 10, 2018.  December 08, 2023. FINDINGS: Normal cardiac size. Left perihilar and upper lobe opacity is noted concerning for atelectasis or infiltrate and underlying neoplasm. No pneumothorax is noted status post bronchoscopy. Right lungs clear. IMPRESSION: Left perihilar and upper lobe opacities noted concerning for atelectasis or infiltrate and underlying neoplasm as noted on prior CT scan. No pneumothorax status post bronchoscopy. Electronically Signed   By:  Rosalene Colon M.D.   On: 12/21/2023 10:25   DG C-ARM BRONCHOSCOPY Result Date: 12/21/2023 C-ARM BRONCHOSCOPY: Fluoroscopy was utilized by the requesting physician.  No radiographic interpretation.   NM PET Image Initial (PI) Skull Base To Thigh (F-18 FDG) Result Date: 12/16/2023 CLINICAL DATA:  Initial treatment strategy for left upper lobe pulmonary mass. EXAM: NUCLEAR MEDICINE PET SKULL BASE TO THIGH TECHNIQUE: 8.61 mCi F-18 FDG was injected intravenously. Full-ring PET imaging was performed from the skull base to thigh after the radiotracer. CT data was obtained and used for attenuation correction and anatomic localization. Fasting blood glucose: 97 mg/dl COMPARISON:  Chest CT, same date. FINDINGS: Mediastinal blood pool activity: SUV max 2.13 Liver activity: SUV max NA NECK: No hypermetabolic lymph nodes in the neck. Incidental CT findings: Scattered calcifications in the tonsillar regions consistent with prior inflammation or infection. CHEST: The left upper lobe pulmonary lesion adjacent to the major fissure is hypermetabolic with SUV max of 6.0 and consistent with primary lung neoplasm. There is hypermetabolic mediastinal lymphadenopathy. Right paratracheal node has an SUV max of 4.87. Left hilar disease which could be direct extension of tumor or adenopathy has an SUV max of 6.66. This surrounds the left upper lobe bronchus and partially occludes it. Subcarinal node has an SUV max of 5.36. Left pleural effusion but no hypermetabolic pleural lesions. Incidental CT findings: Stable vascular calcifications. Small left pleural effusion. ABDOMEN/PELVIS: No abnormal hypermetabolic activity within the liver, pancreas, adrenal glands, or spleen. No hypermetabolic lymph nodes in the abdomen or  pelvis. Incidental CT findings: Moderate atherosclerotic calcifications involving the aorta and branch vessels but no aneurysm. Scattered colonic diverticulosis. SKELETON: Diffuse scattered hypermetabolic bone lesions  consistent with widespread metastatic bone disease. Sternal lesion has an SUV max of 3.42 Right sixth rib lesion has an SUV max of 4.59 T7 lesion has an SUV max of 5.26 Right iliac bone lesion has an SUV max of 5.89. Left hip/bus or trochanteric lesion has an SUV max of 4.39. Incidental CT findings: None. IMPRESSION: 1. Hypermetabolic left upper lobe pulmonary mass invading the left hilum consistent with primary lung neoplasm. 2. Hypermetabolic mediastinal and left hilar lymphadenopathy consistent with metastatic disease. 3. Diffuse scattered hypermetabolic bone lesions consistent with widespread metastatic bone disease. 4. No findings for metastatic disease involving the abdomen/pelvis. 5. Small left pleural effusion but no hypermetabolic pleural lesions. 6. Aortic atherosclerosis. Aortic Atherosclerosis (ICD10-I70.0). Electronically Signed   By: Marrian Siva M.D.   On: 12/16/2023 17:36   CT SUPER D CHEST WO CONTRAST Result Date: 12/16/2023 CLINICAL DATA:  Left upper lobe pulmonary nodule. EXAM: CT CHEST WITHOUT CONTRAST TECHNIQUE: Multidetector CT imaging of the chest was performed using thin slice collimation for electromagnetic bronchoscopy planning purposes, without intravenous contrast. RADIATION DOSE REDUCTION: This exam was performed according to the departmental dose-optimization program which includes automated exposure control, adjustment of the mA and/or kV according to patient size and/or use of iterative reconstruction technique. COMPARISON:  11/07/2023 FINDINGS: Cardiovascular: The heart size is normal. No substantial pericardial effusion. Ascending thoracic aorta measures 4.1 cm diameter. Mild atherosclerotic calcification is noted in the wall of the thoracic aorta. Mediastinum/Nodes: Scattered normal and upper normal sized lymph nodes are seen in the mediastinum including 9 mm short axis right paratracheal node on 67/2. Calcified nodal tissue is seen in the left hilum with abnormal soft tissue  density in the left suprahilar region encasing the left upper lobe bronchus. The esophagus has normal imaging features.There is no axillary lymphadenopathy. Lungs/Pleura: Centrilobular emphsyema noted. 3 mm right middle lobe nodule on image 130/4 is new in the interval. 5 mm subpleural right upper lobe nodule on 73/4 is stable. 4 mm right upper lobe nodule on 61/4 is new. 2 mm right upper lobe nodule on 41/4 has decreased from 4 mm previously. 5 mm subpleural right lower lobe nodule on 91/4 is stable. Soft tissue fullness in the right hilum and suprahilar region is progressive in the interval, encasing the bronchi to the apical in lingular segments of the left upper lobe. 6.7 x 3.5 cm masslike consolidative opacity in the posterior left upper lobe is new in the interval. This is associated with anteromedial left upper lobe volume loss adjacent to the mediastinum. Calcified granulomata noted left base. Small left pleural effusion is new in the interval. Upper Abdomen: No acute findings. 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 Musculoskeletal: No worrisome lytic or sclerotic osseous abnormality. IMPRESSION: 1. Progressive soft tissue fullness in the left hilum and suprahilar region, encasing the bronchi to the apical and lingular segments of the left upper lobe. This soft tissue is confluent with a 6.7 x 3.5 cm masslike consolidative opacity in the posterior left upper lobe, new in the interval. Imaging features raise concern for neoplasm with volume loss in the posterior and anteromedial left upper lobe. 2. New small left pleural effusion. 3. Waxing and waning of small pulmonary nodules in the right lung. Continued close attention on follow-up recommended. 4. Aortic Atherosclerosis (ICD10-I70.0) and Emphysema (ICD10-J43.9). Electronically Signed  By: Donnal Fusi M.D.   On: 12/16/2023 14:03   CT ANGIO CHEST AORTA W/CM & OR WO/CM Result Date: 11/11/2023 CLINICAL DATA:   Follow-up of ascending thoracic aortic aneurysm. Left lung nodule. EXAM: CT ANGIOGRAPHY CHEST WITH CONTRAST TECHNIQUE: Multidetector CT imaging of the chest was performed using the standard protocol during bolus administration of intravenous contrast. Multiplanar CT image reconstructions and MIPs were obtained to evaluate the vascular anatomy. RADIATION DOSE REDUCTION: This exam was performed according to the departmental dose-optimization program which includes automated exposure control, adjustment of the mA and/or kV according to patient size and/or use of iterative reconstruction technique. CONTRAST:  75mL ISOVUE -370 IOPAMIDOL  (ISOVUE -370) INJECTION 76% COMPARISON:  CT of the chest on 09/28/2018 FINDINGS: Cardiovascular: The aortic root measures between approximately 3.6-4.0 cm at the level of the sinuses of Valsalva. The ascending thoracic aorta demonstrates stable mild aneurysmal disease measuring up to approximately 4 cm in maximum diameter. The aortic arch demonstrates stable tortuosity with the proximal arch measuring 2.9 cm and the distal arch 2.3 cm. The descending thoracic aorta measures 2.6 cm. No evidence of aortic dissection. Proximal great vessels demonstrate normal patency with normal variant separate origin of the left vertebral artery off of the aortic arch. The heart size is within normal limits. Mild amount of calcified coronary artery plaque present. No pericardial fluid. Central pulmonary arteries are normal in caliber. Mediastinum/Nodes: Numerous small scattered mediastinal lymph nodes are present. The largest measures roughly 8 mm in short axis at the level of the AP window. There are some calcified hilar lymph nodes in the left hilum. Lungs/Pleura: Significant emphysematous lung disease present as well as diffuse perihilar bronchial thickening bilaterally. There is an area of spiculation at a convergence point of posterior left upper lobe bronchi over a region measuring roughly 1.9 x 1.3 x  2.0 cm. This is also associated with adjacent nodularity and thickening of the adjacent major fissure over several cm. This was not present in 2019. Although this may be secondary to inflammation/infection, malignancy is not excluded and further follow-up and workup may be indicated including short-term interval follow-up CT, possible PET scan and consideration of bronchoscopy targeted to this region. Areas of scattered scarring are present bilaterally. Several tiny subpleural nodules and nodularity bilaterally. Calcified granuloma in the inferior left lower lobe. Upper Abdomen: Probable hepatic steatosis.  No adrenal masses. Musculoskeletal: No chest wall abnormality. No acute or significant osseous findings. Review of the MIP images confirms the above findings. IMPRESSION: 1. 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 Recommend annual imaging followup by CTA or MRA. This recommendation follows 2010 ACCF/AHA/AATS/ACR/ASA/SCA/SCAI/SIR/STS/SVM Guidelines for the Diagnosis and Management of Patients with Thoracic Aortic Disease. Circulation. 2010; 121: Z610-R604. Aortic aneurysm NOS (ICD10-I71.9) 2. Stable tortuosity of the aortic arch. 3. Significant emphysematous lung disease with diffuse perihilar bronchial thickening bilaterally. 4. There is an area of spiculation at a convergence point of posterior left upper lobe bronchi over a region measuring roughly 1.9 x 1.3 x 2.0 cm. This is also associated with adjacent nodularity and thickening of the adjacent major fissure over several cm. This was not present in 2019. Although this may be secondary to inflammation/infection, malignancy is not excluded and further follow-up and workup may be indicated including short-term interval follow-up CT, possible PET scan and consideration of bronchoscopy targeted to this region. Referral to Pulmonology for follow-up is recommended. 5. Numerous small scattered  mediastinal lymph nodes are present. The largest measures  roughly 8 mm in short axis at the level of the AP window. 6. Probable hepatic steatosis. Aortic Atherosclerosis (ICD10-I70.0) and Emphysema (ICD10-J43.9). Electronically Signed   By: Erica Hau M.D.   On: 11/11/2023 10:57     Assessment and plan-   # Acute/subacute nonhemorrhagic infarct with additional multiple small infarcts, suspect embolic etiology. This may be secondary to the hypercoagulable state due to stage IV lung cancer. Agree with anticoagulation with heparin  drip with no bolus as per neurology. Finish stroke workup.  Appreciate neurology evaluation.  # Right lower extremity DVT, agree with heparin  gtt. at discharge, consider switching to Xarelto .  He has a high co-pay with Eliquis .  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  NGS and PD-L1 are pending.  Patient is undecided and was previously leaning towards not to receive any treatments.  Today he may reconsider and we will have another discussion after his molecular testing results.  Thank you for allowing me to participate in the care of this patient.   Timmy Forbes, MD, PhD Hematology Oncology 01/01/2024

## 2024-01-01 NOTE — Telephone Encounter (Signed)
 Called pt and clarified information with him. Pt verbalized understanding and said he was on his way to ER.

## 2024-01-01 NOTE — ED Notes (Signed)
 Pt placed on cardiac monitor and followed by CCMD.

## 2024-01-01 NOTE — ED Provider Notes (Signed)
 [facility] Provider Note    Event Date/Time   First MD Initiated Contact with Patient 01/01/24 1121     (approximate)   History   Cerebrovascular Accident   HPI  Joe Morton is a 73 y.o. 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  Patient was having an MRI done to evaluate for staging and noted to have incidental findings of multiple infarcts.  He has been on Eliquis  for a few days now for DVT.  He denies any neurological symptoms.  IMPRESSION: 1. 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 2. Additional small infarcts involving the more superior left frontal lobe, right corona radiata, left basal ganglia and bilateral cerebellum. This suggests a central source. 3. 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 No pathologic fractures are present.      Physical Exam   Triage Vital Signs: ED Triage Vitals [01/01/24 1027]  Encounter Vitals Group     BP 120/87     Systolic BP Percentile      Diastolic BP Percentile      Pulse Rate (!) 104     Resp 20     Temp 98 F (36.7 C)     Temp Source Oral     SpO2 98 %     Weight      Height      Head Circumference      Peak Flow      Pain Score 0     Pain Loc      Pain Education      Exclude from Growth Chart     Most recent vital signs: Vitals:   01/01/24 1027  BP: 120/87  Pulse: (!) 104  Resp: 20  Temp: 98 F (36.7 C)  SpO2: 98%     General: Awake, no distress.  CV:  Good peripheral perfusion.  Resp:  Normal effort.  Abd:  No distention.  Other:  NIHSS zero.   ED Results / Procedures / Treatments   Labs (all labs ordered are listed, but only abnormal results are displayed) Labs Reviewed  PROTIME-INR - Abnormal; Notable for the following components:      Result Value   Prothrombin  Time 19.5 (*)    INR 1.6 (*)    All other components within normal limits  APTT - Abnormal; Notable for the following components:   aPTT 38 (*)    All other components within normal limits  CBC - Abnormal; Notable for the following components:   WBC 12.6 (*)    All other components within normal limits  DIFFERENTIAL - Abnormal; Notable for the following components:   Neutro Abs 9.2 (*)    Monocytes Absolute 1.4 (*)    Abs Immature Granulocytes 0.19 (*)    All other components within normal limits  COMPREHENSIVE METABOLIC PANEL - Abnormal; Notable for the following components:   Glucose, Bld 110 (*)    Calcium 10.5 (*)    AST 44 (*)    Alkaline Phosphatase 769 (*)    All other components within normal limits  ETHANOL  CBG MONITORING, ED     EKG  My interpretation of EKG:  Normal sinus rhythm 98 without any ST elevation or T wave inversions, normal intervals  RADIOLOGY I have reviewed the MRI personally  interpreted and positive for stroke   PROCEDURES:  Critical Care performed: No  Procedures   MEDICATIONS ORDERED IN ED: Medications - No data to display   IMPRESSION / MDM / ASSESSMENT AND PLAN / ED COURSE  I reviewed the triage vital signs and the nursing notes.   Patient's presentation is most consistent with acute presentation with potential threat to life or bodily function.   Patient comes in with concerns for abnormal MRI outpatient.  He denies any symptoms stroke code not called given out of the window and asymptomatic but MRI concerning for potential embolic causes of stroke will need admission for further causes of stroke since it is centralized process.  Discussed the case with Dr. Doretta Gant so that she could see patient and decide if we should start patient on heparin  or resume at Eliquis .  He is going to review patient's chart and see patient before making decision.   Ethanol normal INR slightly elevated CBC shows slight elevation of white count.  CMP shows  slightly elevated alk phos similar to prior calcium slightly elevated  I will discuss with the hospital team for admission   FINAL CLINICAL IMPRESSION(S) / ED DIAGNOSES   Final diagnoses:  Cerebrovascular accident (CVA), unspecified mechanism (HCC)     Rx / DC Orders   ED Discharge Orders     None        Note:  This document was prepared using Dragon voice recognition software and may include unintentional dictation errors.   Lubertha Rush, MD 01/01/24 [phone]

## 2024-01-02 ENCOUNTER — Observation Stay: Payer: Medicare Other

## 2024-01-02 ENCOUNTER — Encounter: Payer: Self-pay | Admitting: Internal Medicine

## 2024-01-02 ENCOUNTER — Observation Stay (HOSPITAL_BASED_OUTPATIENT_CLINIC_OR_DEPARTMENT_OTHER)
Admit: 2024-01-02 | Discharge: 2024-01-02 | Disposition: A | Discharge status: Home / Self Care | Payer: Medicare Other | Attending: Internal Medicine | Admitting: Internal Medicine

## 2024-01-02 DIAGNOSIS — I6389 Other cerebral infarction: Secondary | ICD-10-CM | POA: Diagnosis not present

## 2024-01-02 DIAGNOSIS — I6529 Occlusion and stenosis of unspecified carotid artery: Secondary | ICD-10-CM | POA: Diagnosis not present

## 2024-01-02 DIAGNOSIS — C801 Malignant (primary) neoplasm, unspecified: Secondary | ICD-10-CM | POA: Diagnosis not present

## 2024-01-02 DIAGNOSIS — I6310 Cerebral infarction due to embolism of unspecified precerebral artery: Secondary | ICD-10-CM | POA: Diagnosis not present

## 2024-01-02 DIAGNOSIS — I82401 Acute embolism and thrombosis of unspecified deep veins of right lower extremity: Secondary | ICD-10-CM | POA: Diagnosis not present

## 2024-01-02 DIAGNOSIS — C7951 Secondary malignant neoplasm of bone: Secondary | ICD-10-CM | POA: Diagnosis not present

## 2024-01-02 DIAGNOSIS — C3490 Malignant neoplasm of unspecified part of unspecified bronchus or lung: Secondary | ICD-10-CM | POA: Diagnosis not present

## 2024-01-02 LAB — ECHOCARDIOGRAM COMPLETE
AR max vel: 2.66 cm2
AV Peak grad: 11 mm[Hg]
Ao pk vel: 1.66 m/s
Area-P 1/2: 3.48 cm2
Height: 68 in
MV M vel: 4.44 m/s
MV Peak grad: 78.8 mm[Hg]
S' Lateral: 3.7 cm

## 2024-01-02 LAB — CBC
HCT: 47.6 % (ref 39.0–52.0)
Hemoglobin: 16.6 g/dL (ref 13.0–17.0)
MCH: 31.7 pg (ref 26.0–34.0)
MCHC: 34.9 g/dL (ref 30.0–36.0)
MCV: 91 fL (ref 80.0–100.0)
Platelets: 147 10*3/uL — ABNORMAL LOW (ref 150–400)
RBC: 5.23 MIL/uL (ref 4.22–5.81)
RDW: 13 % (ref 11.5–15.5)
WBC: 11.9 10*3/uL — ABNORMAL HIGH (ref 4.0–10.5)
nRBC: 0 % (ref 0.0–0.2)

## 2024-01-02 LAB — COMPREHENSIVE METABOLIC PANEL
ALT: 32 U/L (ref 0–44)
AST: 43 U/L — ABNORMAL HIGH (ref 15–41)
Albumin: 3.2 g/dL — ABNORMAL LOW (ref 3.5–5.0)
Alkaline Phosphatase: 722 U/L — ABNORMAL HIGH (ref 38–126)
Anion gap: 13 (ref 5–15)
BUN: 18 mg/dL (ref 8–23)
CO2: 28 mmol/L (ref 22–32)
Calcium: 10.1 mg/dL (ref 8.9–10.3)
Chloride: 99 mmol/L (ref 98–111)
Creatinine, Ser: 1.08 mg/dL (ref 0.61–1.24)
GFR, Estimated: >60 mL/min (ref >60)
Glucose, Bld: 89 mg/dL (ref 70–99)
Potassium: 4.5 mmol/L (ref 3.5–5.1)
Sodium: 140 mmol/L (ref 135–145)
Total Bilirubin: 0.3 mg/dL (ref 0.0–1.2)
Total Protein: 6.7 g/dL (ref 6.5–8.1)

## 2024-01-02 LAB — APTT
aPTT: 69 s — ABNORMAL HIGH (ref 24–36)
aPTT: 54 s — ABNORMAL HIGH (ref 24–36)

## 2024-01-02 LAB — HEPARIN LEVEL (UNFRACTIONATED): Heparin Unfractionated: >1.1 [IU]/mL — ABNORMAL HIGH (ref 0.30–0.70)

## 2024-01-02 MED ORDER — STROKE: EARLY STAGES OF RECOVERY BOOK: Freq: Once | Status: AC

## 2024-01-02 MED ORDER — ENSURE ENLIVE PO LIQD
237.0000 mL | Freq: Two times a day (BID) | ORAL | Status: DC
  Administered 2024-01-02 – 2024-01-03 (×3): 237 mL via ORAL

## 2024-01-02 MED ORDER — IOHEXOL 350 MG/ML SOLN
75.0000 mL | Freq: Once | INTRAVENOUS | Status: AC | PRN
  Administered 2024-01-02: 75 mL via INTRAVENOUS

## 2024-01-02 NOTE — Progress Notes (Signed)
PHARMACY - ANTICOAGULATION CONSULT NOTE  Pharmacy Consult for heparin infusion Indication: RLE DVT ISO CVA  No Known Allergies  Patient Measurements: Height: 5\' 8"  (172.7 cm) IBW/kg (Calculated) : 68.4 Heparin Dosing Weight: 69.7 kg  Vital Signs: Temp: 97.7 F (36.5 C) (02/11 0400) Temp Source: Oral (02/11 0400) BP: 96/76 (02/11 0400) Pulse Rate: 73 (02/11 0400)  Labs: Recent Labs    01/01/24 1029 01/02/24 0523  HGB 16.9 16.6  HCT 50.3 47.6  PLT 155 147*  APTT 38* 69*  LABPROT 19.5*  --   INR 1.6*  --   HEPARINUNFRC  --  >1.10*  CREATININE 1.11 1.08    Estimated Creatinine Clearance: 60.7 mL/min (by C-G formula based on SCr of 1.08 mg/dL).   Medical History: Past Medical History:  Diagnosis Date   Emphysema lung (HCC)    Hypertension     Assessment: 73 y/o M w/ PMH of stage IV lung cancer, smoker, HTN, insomnia who presents after being told to come to the hospital by his oncologist. He was on apixaban prior to arrival with last dose   01/01/24 at approximately 0900  Baseline labs: H&H/PLT wnl, aPTT 38s, INR 1.6  2/11 0523 aPTT 69, HL >1.1; therapeutic and not correlating   Goal of Therapy:  Heparin level 0.3-0.5 units/ml aPTT 66 - 85 seconds Monitor platelets by anticoagulation protocol: Yes   Plan:  ---Continue heparin infusion at 900 units/hr (no loading dose ISO indication and recent apixaban administration) ---Check confirmatory aPTT level in 8 hours and heparin level at least once daily while on heparin (we will use aPTT to guide therapy until aPTT and heparin level correlate) ---Continue to monitor H&H and platelets  Merryl Hacker, PharmD Clinical Pharmacist 01/02/2024,7:48 AM

## 2024-01-02 NOTE — Progress Notes (Signed)
Echocardiogram 2D Echocardiogram has been performed.  Joe Morton 01/02/2024, 6:09 PM

## 2024-01-02 NOTE — Progress Notes (Signed)
PHARMACY - ANTICOAGULATION CONSULT NOTE  Pharmacy Consult for heparin infusion Indication: RLE DVT ISO CVA  No Known Allergies  Patient Measurements: Height: 5\' 8"  (172.7 cm) IBW/kg (Calculated) : 68.4 Heparin Dosing Weight: 69.7 kg  Vital Signs: Temp: 98.9 F (37.2 C) (02/11 0823) Temp Source: Oral (02/11 0823) BP: 94/70 (02/11 0823) Pulse Rate: 88 (02/11 0823)  Labs: Recent Labs    01/01/24 1029 01/02/24 0523 01/02/24 1400  HGB 16.9 16.6  --   HCT 50.3 47.6  --   PLT 155 147*  --   APTT 38* 69* 54*  LABPROT 19.5*  --   --   INR 1.6*  --   --   HEPARINUNFRC  --  >1.10*  --   CREATININE 1.11 1.08  --     Estimated Creatinine Clearance: 60.7 mL/min (by C-G formula based on SCr of 1.08 mg/dL).   Medical History: Past Medical History:  Diagnosis Date   Emphysema lung (HCC)    Hypertension     Assessment: 73 y/o M w/ PMH of stage IV lung cancer, smoker, HTN, insomnia who presents after being told to come to the hospital by his oncologist. He was on apixaban prior to arrival with last dose   01/01/24 at approximately 0900  Baseline labs: H&H/PLT wnl, aPTT 38s, INR 1.6  2/11 0523 aPTT 69, HL >1.1; therapeutic and not correlating  2/11 1400 aPTT 54, subtherapeutic   Goal of Therapy:  Heparin level 0.3-0.5 units/ml aPTT 66 - 85 seconds Monitor platelets by anticoagulation protocol: Yes   Plan:  ---aPTT subtherapeutic  ---Increase heparin infusion rate to 1000 units/hr (no loading dose ISO indication and recent apixaban administration) ---Check aPTT level in 8 hours and heparin level at least once daily while on heparin (we will use aPTT to guide therapy until aPTT and heparin level correlate) ---Continue to monitor H&H and platelets  Merryl Hacker, PharmD Clinical Pharmacist 01/02/2024,2:59 PM

## 2024-01-02 NOTE — Consult Note (Signed)
NEUROLOGY CONSULT NOTE   Date of service: January 02, 2024 Patient Name: Joe Morton MRN:  [id number] DOB:  05-Nov-1951 Chief Complaint: multifocal embolic infarcts on outpatient brain MRI Requesting Provider: Charise Killian, MD  History of Present Illness   73 yo man with newly diagnosed Stage IV NSCLC and dx admitted for incidental findings of multifocal acute infarcts on outpatient MRI brain 12/30/23 that was ordered for the purpose of staging. He was also diagnosed with RLE DVT last week and had been taking eliquis for the past 3 days. 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 He is asymptomatic from this and has NIHSS = 0.   LKW: unknown, asymptomatic, at least 48 hrs ago Modified rankin score: 0-Completely asymptomatic and back to baseline post- stroke IV Thrombolysis: No outside the window and on eliquis EVT: no, no LVO  NIHSS components Score: Comment  1a Level of Conscious 0[x]  1[]  2[]  3[]      1b LOC Questions 0[x]  1[]  2[]       1c LOC Commands 0[x]  1[]  2[]       2 Best Gaze 0[x]  1[]  2[]       3 Visual 0[x]  1[]  2[]  3[]      4 Facial Palsy 0[x]  1[]  2[]  3[]      5a Motor Arm - left 0[x]  1[]  2[]  3[]  4[]  UN[]    5b Motor Arm - Right 0[x]  1[]  2[]  3[]  4[]  UN[]    6a Motor Leg - Left 0[x]  1[]  2[]  3[]  4[]  UN[]    6b Motor Leg - Right 0[x]  1[]  2[]  3[]  4[]  UN[]    7 Limb Ataxia 0[x]  1[]  2[]  3[]  UN[]     8 Sensory 0[x]  1[]  2[]  UN[]      9 Best Language 0[x]  1[]  2[]  3[]      10 Dysarthria 0[x]  1[]  2[]  UN[]      11 Extinct. and Inattention 0[x]  1[]  2[]       TOTAL:  0      ROS  Comprehensive ROS performed and pertinent positives documented in HPI   Past History   Past Medical History:  Diagnosis Date   Emphysema lung (HCC)    Hypertension     Past Surgical History:  Procedure Laterality Date   BRONCHIAL BIOPSY  12/21/2023   Procedure: BRONCHIAL BIOPSIES;  Surgeon: [name], MD;  Location: [facility]  ENDOSCOPY;  Service: Pulmonary;;   BRONCHIAL NEEDLE ASPIRATION BIOPSY  12/21/2023   Procedure: BRONCHIAL NEEDLE ASPIRATION BIOPSIES;  Surgeon: [name], MD;  Location: [facility] ENDOSCOPY;  Service: Pulmonary;;   BRONCHIAL WASHINGS  12/21/2023   Procedure: BRONCHIAL WASHINGS;  Surgeon: [name], MD;  Location: [facility] ENDOSCOPY;  Service: Pulmonary;;   COLONOSCOPY     ENDOBRONCHIAL ULTRASOUND Bilateral 12/21/2023   Procedure: ENDOBRONCHIAL ULTRASOUND;  Surgeon: [name], MD;  Location: [facility] ENDOSCOPY;  Service: Pulmonary;  Laterality: Bilateral;   FINE NEEDLE ASPIRATION  12/21/2023   Procedure: FINE NEEDLE ASPIRATION (FNA) LINEAR;  Surgeon: [name], MD;  Location: [facility] ENDOSCOPY;  Service: Pulmonary;;   INGUINAL HERNIA REPAIR Left 09/17/2018   Procedure: OPEN LEFT INGUINAL HERNIA REPAIR WITH MESH;  Surgeon: Jimmye Norman, MD;  Location: [facility] OR;  Service: General;  Laterality: Left;  GENERAL AND LMA   INSERTION OF MESH Left 09/17/2018   Procedure: INSERTION OF MESH;  Surgeon: Jimmye Norman, MD;  Location: [facility] OR;  Service: General;  Laterality: Left;  GENERAL AND LMA    Family History: History reviewed. No pertinent family history.  Social History  reports that he has been smoking cigarettes. He started smoking about 44 years ago. He has a 44.1 pack-year smoking history. He has never used smokeless tobacco. He reports current alcohol use of about 6.0 standard drinks of alcohol per week. He reports that he does not use drugs.  No Known Allergies  Medications   Current Facility-Administered Medications:    acetaminophen (TYLENOL) tablet 650 mg, 650 mg, Oral, Q6H PRN **OR** acetaminophen (TYLENOL) suppository 650 mg, 650 mg, Rectal, Q6H PRN, Charise Killian, MD   feeding supplement (ENSURE ENLIVE / ENSURE PLUS) liquid 237 mL, 237 mL, Oral, BID BM, Charise Killian, MD   guaiFENesin-dextromethorphan (ROBITUSSIN DM) 100-10 MG/5ML syrup 5 mL, 5 mL, Oral, Q4H PRN,  Charise Killian, MD, 5 mL at 01/01/24 2137   heparin ADULT infusion 100 units/mL (25000 units/236mL), 900 Units/hr, Intravenous, Continuous, Lowella Bandy, [facility], Last Rate: 9 mL/hr at 01/01/24 2129, 900 Units/hr at 01/01/24 2129   HYDROcodone-acetaminophen (NORCO/VICODIN) 5-325 MG per tablet 1-2 tablet, 1-2 tablet, Oral, Q4H PRN, Charise Killian, MD, 2 tablet at 01/02/24 0012   mirtazapine (REMERON) tablet 7.5 mg, 7.5 mg, Oral, QHS, Charise Killian, MD, 7.5 mg at 01/01/24 2137   morphine (PF) 2 MG/ML injection 1 mg, 1 mg, Intravenous, Q4H PRN, Charise Killian, MD   ondansetron [facility]) tablet 4 mg, 4 mg, Oral, Q6H PRN **OR** ondansetron (ZOFRAN) injection 4 mg, 4 mg, Intravenous, Q6H PRN, Charise Killian, MD   senna-docusate (Senokot-S) tablet 1 tablet, 1 tablet, Oral, QHS PRN, Charise Killian, MD   sodium chloride flush (NS) 0.9 % injection 3 mL, 3 mL, Intravenous, Q12H, Charise Killian, MD, 3 mL at 01/01/24 2129  Vitals   Vitals:   01/01/24 1639 01/01/24 2000 01/02/24 0000 01/02/24 0400  BP: 111/82 115/84 115/79 96/76  Pulse: 98 97 88 73  Resp: 18 17 18 16   Temp: 97.7 F (36.5 C) 100.1 F (37.8 C) 99.4 F (37.4 C) 97.7 F (36.5 C)  TempSrc:  Oral Oral Oral  SpO2: 93% 94% 93% 93%  Height:        Body mass index is 23.35 kg/m.  Physical Exam   Constitutional: Appears well-developed and well-nourished.  Psych: Affect appropriate to situation.  Eyes: No scleral injection.  HENT: No OP obstruction.  Head: Normocephalic.  Cardiovascular: Normal rate and regular rhythm.  Respiratory: Effort normal, non-labored breathing.  GI: Soft.  No distension. There is no tenderness.  Skin: WDI.   Neurologic Examination   *MS: A&O x4. Follows multi-step commands.  *Speech: no dysarthria or aphasia, able to name and repeat. *CN:    I: Deferred   II,III: PERRLA, VFF by confrontation, optic discs not visualized 2/2 pupillary constriction   III,IV,VI: EOMI w/o  nystagmus, no ptosis   V: Sensation intact from V1 to V3 to LT   VII: Eyelid closure was full.  Smile symmetric.   VIII: Hearing intact to voice   IX,X: Voice normal, palate elevates symmetrically    XI: SCM/trap 5/5 bilat   XII: Tongue protrudes midline, no atrophy or fasciculations  *Motor:   Normal bulk.  No tremor, rigidity or bradykinesia. No pronator drift.   Strength: Dlt Bic Tri WE WrF FgS Gr HF KnF KnE PlF DoF    Left 5 5 5 5 5 5 5 5 5 5 5 5     Right 5 5 5 5 5 5 5 5 5 5 5 5    *Sensory: Intact to light touch, pinprick,  temperature vibration throughout. Symmetric. Propioception intact bilat.  No double-simultaneous extinction.  *Coordination:  Finger-to-nose, heel-to-shin, rapid alternating motions were intact. *Reflexes:  2+ and symmetric throughout without clonus; toes down-going bilat *Gait: deferred   Labs/Imaging/Neurodiagnostic studies   CBC:  Recent Labs  Lab 01-20-2024 1029 01/02/24 0523  WBC 12.6* 11.9*  NEUTROABS 9.2*  --   HGB 16.9 16.6  HCT 50.3 47.6  MCV 94.5 91.0  PLT 155 147*   Basic Metabolic Panel:  Lab Results  Component Value Date   NA 139 Jan 20, 2024   K 4.1 01/20/24   CO2 27 2024-01-20   GLUCOSE 110 (H) Jan 20, 2024   BUN 17 01/20/24   CREATININE 1.11 2024/01/20   CALCIUM 10.5 (H) January 20, 2024   GFRNONAA >60 01-20-24   GFRAA >60 09/10/2018   Lipid Panel: No results found for: "LDLCALC" HgbA1c: No results found for: "HGBA1C" Urine Drug Screen: No results found for: "LABOPIA", "COCAINSCRNUR", "LABBENZ", "AMPHETMU", "THCU", "LABBARB"  Alcohol Level     Component Value Date/Time   ETH <10 01/20/2024 1029   INR  Lab Results  Component Value Date   INR 1.6 (H) 20-Jan-2024   APTT  Lab Results  Component Value Date   APTT 38 (H) Jan 20, 2024   AED levels: No results found for: "PHENYTOIN", "ZONISAMIDE", "LAMOTRIGINE", "LEVETIRACETA"  MRI Brain(Personally reviewed): 1. Acute/subacute nonhemorrhagic infarct in the left middle  frontal gyrus measures up to 20 mm. 2. Additional small infarcts involving the more superior left frontal lobe, right corona radiata, left basal ganglia and bilateral cerebellum. This suggests a central source. 3. 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 No pathologic fractures are present.  ASSESSMENT   73 yo man with newly diagnosed Stage IV NSCLC and dx admitted for incidental findings of multifocal acute infarcts on outpatient MRI brain 12/30/23 that was ordered for the purpose of staging. He was also diagnosed with RLE DVT last week and had been taking eliquis for the past 3 days. 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 He is asymptomatic from this and has NIHSS = 0. Overall burden of acute infarct on imaging is relatively small. Given this and the facts that he has known DVT and is hypercoagulable in the setting of malignancy recommend continuing anticoagulation with heparin at this time. The risk of withholding anticoagulation in this setting outweighs the risk of hemorrhagic conversion if it continued.   RECOMMENDATIONS   - Admit for stroke workup - No indication for permissive HTN >48 hrs after MRI showed acute infarcts; goal normotension, avoid hypotension - CTA or MRA H&N - TTE w/ bubble - Check A1c and LDL + add statin per guidelines - Heparin gtt stroke protocol, low goal, no bolus. Hold eliquis. - q4 hr neuro checks - STAT head CT for any change in neuro exam - Tele - PT/OT/SLP - Stroke education - Amb referral to neurology upon discharge  Will continue to follow  ______________________________________________________________________    Signed, Jefferson Fuel, MD [facility]

## 2024-01-02 NOTE — Evaluation (Signed)
Occupational Therapy Evaluation Patient Details Name: Joe Morton MRN: [id number] DOB: 25-Feb-1951 Today's Date: 01/02/2024   History of Present Illness   History of Present Illness: Per MD Notes: Joe Morton is a 73 y.o. male who is newly diagnosed with Stage IV lung cancer and admitted for incidental findings of multifocal acute infarcts on outpatient MRI brain 12/30/23 that was ordered for the purpose of staging. He was also diagnosed with RLE DVT last week and had been taking eliquis. Now on IV heparin.     Clinical Impressions Joe Morton was seen for OT evaluation this date. Prior to hospital admission, pt was independent in all aspects of ADL/IADL management. He endorses enjoying golf and staying active. Denies falls history in the last 6 months.. Pt lives with his spouse in a 2 story home with 1 step to enter. Pt is able to live on mail level. Pt demonstrates baseline independence to perform ADL and mobility tasks and no strength, sensory, coordination, cognitive, or visual deficits appreciated with assessment. No skilled OT needs identified. Will sign off. Please re-consult if additional OT needs arise.      If plan is discharge home, recommend the following:   Assist for transportation     Functional Status Assessment   Patient has not had a recent decline in their functional status     Equipment Recommendations   None recommended by OT     Recommendations for Other Services         Precautions/Restrictions   Precautions Precautions: Fall Precaution/Restrictions Comments: Moderate Fall     Mobility Bed Mobility Overal bed mobility: Independent                  Transfers Overall transfer level: Independent                 General transfer comment: Pushed IV pole for convenience but able to stand/amb without device.      Balance Overall balance assessment: Independent, No apparent balance deficits (not formally assessed)                                          ADL either performed or assessed with clinical judgement   ADL Overall ADL's : At baseline                                       General ADL Comments: Able to perform functional mobility without AD, denies acute strenght, sensory, cognitive, or visual deficits this date. Feels at or near baseline level of functional independence. Primary difficulty this AM is transferring with IV pole in/out of bathroom. Educated on falls prevention, safety, and role of OT in acute setting,     Vision Baseline Vision/History: 1 Wears glasses Ability to See in Adequate Light: 1 Impaired Patient Visual Report: No change from baseline       Perception         Praxis         Pertinent Vitals/Pain Pain Assessment Pain Assessment: No/denies pain     Extremity/Trunk Assessment Upper Extremity Assessment Upper Extremity Assessment: Overall WFL for tasks assessed   Lower Extremity Assessment Lower Extremity Assessment: Overall WFL for tasks assessed   Cervical / Trunk Assessment Cervical / Trunk Assessment: Normal   Communication Communication Communication: No apparent  difficulties   Cognition Arousal: Alert Behavior During Therapy: WFL for tasks assessed/performed Cognition: No apparent impairments             OT - Cognition Comments: Pleasant, conversational, A&O x4.                         Cueing  General Comments          Exercises Other Exercises Other Exercises: Pt/spouse educated on role of OT in acute setting, safety, falls prevention. Both return verbalize understanding .   Shoulder Instructions      Home Living Family/patient expects to be discharged to:: Private residence Living Arrangements: Spouse/significant other Available Help at Discharge: Available 24 hours/day;Family Type of Home: House Home Access: Stairs to enter [employer] of Steps: 1 Entrance Stairs-Rails: None Home  Layout: Two level;Able to live on main level with bedroom/bathroom     Bathroom Shower/Tub: Producer, television/film/video: Standard     Home Equipment: None          Prior Functioning/Environment Prior Level of Function : Independent/Modified Independent             Mobility Comments: Independent, no AD. ADLs Comments: Active, plays golf, totally independent with ADL/IADL.    OT Problem List: Cardiopulmonary status limiting activity;Decreased knowledge of use of DME or AE;Decreased safety awareness   OT Treatment/Interventions:        OT Goals(Current goals can be found in the care plan section)   Acute Rehab OT Goals Patient Stated Goal: To go home OT Goal Formulation: All assessment and education complete, DC therapy Time For Goal Achievement: 01/02/24 Potential to Achieve Goals: Good   OT Frequency:       Co-evaluation              AM-PAC OT "6 Clicks" Daily Activity     Outcome Measure Help from another person eating meals?: None Help from another person taking care of personal grooming?: None Help from another person toileting, which includes using toliet, bedpan, or urinal?: None Help from another person bathing (including washing, rinsing, drying)?: None Help from another person to put on and taking off regular upper body clothing?: None Help from another person to put on and taking off regular lower body clothing?: None 6 Click Score: 24   End of Session Equipment Utilized During Treatment: Gait belt  Activity Tolerance: Patient tolerated treatment well Patient left: in chair;with call bell/phone within reach;with family/visitor present  OT Visit Diagnosis: Other symptoms and signs involving the nervous system (Z61.096)                Time: 0454-0981 OT Time Calculation (min): 13 min Charges:  OT General Charges $OT Visit: 1 Visit OT Evaluation $OT Eval Low Complexity: 1 Low  Joe Morton, M.S., OTR/L 01/02/24, 9:42 AM

## 2024-01-02 NOTE — Plan of Care (Signed)

## 2024-01-02 NOTE — Progress Notes (Signed)
 Hematology/Oncology Progress note [phone] [phone]     Patient Care Team: Emilio Aspen, MD as PCP - General (Internal Medicine) Rickard Patience, MD as Consulting Physician (Oncology) Glory Buff, RN as Oncology Nurse Navigator   Name of the patient: Joe Morton  [id number]  1950/12/13  Date of visit: 01/02/24   INTERVAL HISTORY-  No acute overnight events. 01/02/2024 CT angio head and neck with and without contrast showed no large vessel occlusion or proximal hemodynamically significant stenosis. 01/02/2024 echocardiogram showed LVEF 55 to 60%.   No Known Allergies  Patient Active Problem List   Diagnosis Date Noted   Malignant neoplasm of upper lobe of left lung (HCC) 11/29/2023    Priority: High   Leukocytosis 12/25/2023    Priority: Medium    Metastatic cancer to bone (HCC) 12/25/2023    Priority: Medium    Goals of care, counseling/discussion 12/25/2023    Priority: Low   CVA (cerebral vascular accident) (HCC) 01/01/2024   Acute deep vein thrombosis (DVT) of right lower extremity (HCC) 01/01/2024   Adenocarcinoma of lung, stage 4 (HCC) 01/01/2024     Past Medical History:  Diagnosis Date   Emphysema lung (HCC)    Hypertension      Past Surgical History:  Procedure Laterality Date   BRONCHIAL BIOPSY  12/21/2023   Procedure: BRONCHIAL BIOPSIES;  Surgeon: [name], MD;  Location: [facility];  Service: Pulmonary;;   BRONCHIAL NEEDLE ASPIRATION BIOPSY  12/21/2023   Procedure: BRONCHIAL NEEDLE ASPIRATION BIOPSIES;  Surgeon: [name], MD;  Location: [facility];  Service: Pulmonary;;   BRONCHIAL WASHINGS  12/21/2023   Procedure: BRONCHIAL WASHINGS;  Surgeon: [name], MD;  Location: [facility];  Service: Pulmonary;;   COLONOSCOPY     ENDOBRONCHIAL ULTRASOUND Bilateral 12/21/2023   Procedure: ENDOBRONCHIAL ULTRASOUND;  Surgeon: [name], MD;  Location: [facility];  Service: Pulmonary;   Laterality: Bilateral;   FINE NEEDLE ASPIRATION  12/21/2023   Procedure: FINE NEEDLE ASPIRATION (FNA) LINEAR;  Surgeon: [name], MD;  Location: [facility];  Service: Pulmonary;;   INGUINAL HERNIA REPAIR Left 09/17/2018   Procedure: OPEN LEFT INGUINAL HERNIA REPAIR WITH MESH;  Surgeon: Jimmye Norman, MD;  Location: [facility] OR;  Service: General;  Laterality: Left;  GENERAL AND LMA   INSERTION OF MESH Left 09/17/2018   Procedure: INSERTION OF MESH;  Surgeon: Jimmye Norman, MD;  Location: [facility] OR;  Service: General;  Laterality: Left;  GENERAL AND LMA    Social History   Socioeconomic History   Marital status: Married    Spouse name: Not on file   Number of children: Not on file   Years of education: Not on file   Highest education level: Not on file  Occupational History   Not on file  Tobacco Use   Smoking status: Every Day    Current packs/day: 1.00    Average packs/day: 1 pack/day for 44.1 years (44.1 ttl pk-yrs)    Types: Cigarettes    Start date: 85   Smokeless tobacco: Never   Tobacco comments:    Smoking 3 cigarettes a day x 2 weeks   Vaping Use   Vaping status: Never Used  Substance and Sexual Activity   Alcohol use: Yes    Alcohol/week: 6.0 standard drinks of alcohol    Types: 6 Standard drinks or equivalent per week    Comment: occasionally   Drug use: No   Sexual activity: Not on file  Other Topics Concern   Not on file  Social History Narrative   Not on file   Social Drivers of Health   Financial Resource Strain: Low Risk  (12/25/2023)   Overall Financial Resource Strain (CARDIA)    Difficulty of Paying Living Expenses: Not very hard  Food Insecurity: No Food Insecurity (01/01/2024)   Hunger Vital Sign    Worried About Running Out of Food in the Last Year: Never true    Ran Out of Food in the Last Year: Never true  Transportation Needs: No Transportation Needs (01/01/2024)   PRAPARE - Administrator, Civil Service (Medical): No    Lack of  Transportation (Non-Medical): No  Physical Activity: Not on file  Stress: No Stress Concern Present (12/25/2023)   [employer] of Occupational Health - Occupational Stress Questionnaire    Feeling of Stress : Only a little  Social Connections: Moderately Integrated (01/01/2024)   Social Connection and Isolation Panel [NHANES]    Frequency of Communication with Friends and Family: Three times a week    Frequency of Social Gatherings with Friends and Family: Once a week    Attends Religious Services: 1 to 4 times per year    Active Member of [employer] or Organizations: No    Attends Banker Meetings: Never    Marital Status: Married  Catering manager Violence: Not At Risk (01/01/2024)   Humiliation, Afraid, Rape, and Kick questionnaire    Fear of Current or Ex-Partner: No    Emotionally Abused: No    Physically Abused: No    Sexually Abused: No     History reviewed. No pertinent family history.   Current Facility-Administered Medications:    acetaminophen (TYLENOL) tablet 650 mg, 650 mg, Oral, Q6H PRN **OR** acetaminophen (TYLENOL) suppository 650 mg, 650 mg, Rectal, Q6H PRN, Charise Killian, MD   feeding supplement (ENSURE ENLIVE / ENSURE PLUS) liquid 237 mL, 237 mL, Oral, BID BM, Charise Killian, MD, 237 mL at 01/02/24 1439   guaiFENesin-dextromethorphan (ROBITUSSIN DM) 100-10 MG/5ML syrup 5 mL, 5 mL, Oral, Q4H PRN, Charise Killian, MD, 5 mL at 01/02/24 1443   heparin ADULT infusion 100 units/mL (25000 units/251mL), 1,000 Units/hr, Intravenous, Continuous, Hunt, Madison H, RPH, Last Rate: 10 mL/hr at 01/02/24 1509, 1,000 Units/hr at 01/02/24 1509   HYDROcodone-acetaminophen (NORCO/VICODIN) 5-325 MG per tablet 1-2 tablet, 1-2 tablet, Oral, Q4H PRN, Charise Killian, MD, 2 tablet at 01/02/24 0012   mirtazapine (REMERON) tablet 7.5 mg, 7.5 mg, Oral, QHS, Charise Killian, MD, 7.5 mg at 01/01/24 2137   morphine (PF) 2 MG/ML injection 1 mg, 1 mg, Intravenous,  Q4H PRN, Charise Killian, MD   ondansetron [facility]) tablet 4 mg, 4 mg, Oral, Q6H PRN **OR** ondansetron (ZOFRAN) injection 4 mg, 4 mg, Intravenous, Q6H PRN, Charise Killian, MD   senna-docusate (Senokot-S) tablet 1 tablet, 1 tablet, Oral, QHS PRN, Charise Killian, MD   sodium chloride flush (NS) 0.9 % injection 3 mL, 3 mL, Intravenous, Q12H, Charise Killian, MD, 3 mL at 01/02/24 1002   Physical exam:  Vitals:   01/02/24 0400 01/02/24 0823 01/02/24 1221 01/02/24 1717  BP: 96/76 94/70 103/72 101/79  Pulse: 73 88 88 81  Resp: 16 17 18 16   Temp: 97.7 F (36.5 C) 98.9 F (37.2 C) 98.6 F (37 C) 98.2 F (36.8 C)  TempSrc: Oral Oral Oral   SpO2: 93% 93% 93% 91%  Height:       Physical Exam Constitutional:      General: He  is not in acute distress.    Appearance: He is not diaphoretic.  HENT:     Head: Normocephalic and atraumatic.  Eyes:     General: No scleral icterus. Cardiovascular:     Rate and Rhythm: Normal rate and regular rhythm.     Heart sounds: No murmur heard. Pulmonary:     Effort: Pulmonary effort is normal. No respiratory distress.  Abdominal:     General: There is no distension.     Palpations: Abdomen is soft.     Tenderness: There is no abdominal tenderness.  Musculoskeletal:        General: Normal range of motion.     Cervical back: Normal range of motion and neck supple.  Skin:    General: Skin is warm and dry.  Neurological:     Mental Status: He is alert and oriented to person, place, and time. Mental status is at baseline.     Motor: No abnormal muscle tone.  Psychiatric:        Mood and Affect: Mood and affect normal.       Labs    Latest Ref Rng & Units 01/02/2024    5:23 AM 01/01/2024   10:29 AM 12/25/2023   10:32 AM  CBC  WBC 4.0 - 10.5 K/uL 11.9  12.6  11.2   Hemoglobin 13.0 - 17.0 g/dL 16.1  09.6  04.5   Hematocrit 39.0 - 52.0 % 47.6  50.3  49.8   Platelets 150 - 400 K/uL 147  155  204       Latest Ref Rng & Units  01/02/2024    5:23 AM 01/01/2024   10:29 AM 12/25/2023   10:32 AM  CMP  Glucose 70 - 99 mg/dL 89  409  811   BUN 8 - 23 mg/dL 18  17  11    Creatinine 0.61 - 1.24 mg/dL 9.14  7.82  9.56   Sodium 135 - 145 mmol/L 140  139  130   Potassium 3.5 - 5.1 mmol/L 4.5  4.1  4.4   Chloride 98 - 111 mmol/L 99  100  96   CO2 22 - 32 mmol/L 28  27  26    Calcium 8.9 - 10.3 mg/dL 21.3  08.6  9.5   Total Protein 6.5 - 8.1 g/dL 6.7  7.2  7.3   Total Bilirubin 0.0 - 1.2 mg/dL 0.3  0.9  0.6   Alkaline Phos 38 - 126 U/L 722  769  609   AST 15 - 41 U/L 43  44  26   ALT 0 - 44 U/L 32  31  21      RADIOGRAPHIC STUDIES: I have personally reviewed the radiological images as listed and agreed with the findings in the report. CT ANGIO HEAD NECK W WO CM Result Date: 01/02/2024 CLINICAL DATA:  Stroke/TIA, determine embolic source EXAM: CT ANGIOGRAPHY HEAD AND NECK WITH AND WITHOUT CONTRAST TECHNIQUE: Multidetector CT imaging of the head and neck was performed using the standard protocol during bolus administration of intravenous contrast. Multiplanar CT image reconstructions and MIPs were obtained to evaluate the vascular anatomy. Carotid stenosis measurements (when applicable) are obtained utilizing NASCET criteria, using the distal internal carotid diameter as the denominator. RADIATION DOSE REDUCTION: This exam was performed according to the departmental dose-optimization program which includes automated exposure control, adjustment of the mA and/or kV according to patient size and/or use of iterative reconstruction technique. CONTRAST:  75mL OMNIPAQUE IOHEXOL 350 MG/ML SOLN COMPARISON:  None  Available. FINDINGS: CT HEAD FINDINGS Brain: Known acute infarcts better characterized on recent MRI. No progressive mass effect or acute hemorrhage. No midline shift. No hydrocephalus. Skull: Known metastases better seen on recent MRI. Sinuses/orbits: No acute abnormality. CTA NECK FINDINGS Aortic arch: Great vessel origins are patent  without significant stenosis. Right carotid system: Atherosclerosis at the carotid bifurcation without greater than 50% stenosis. Left carotid system: Atherosclerosis at the carotid bifurcation without greater than 50% stenosis. Vertebral arteries: Right-dominant. No evidence of dissection, stenosis (50% or greater), or occlusion. Skeleton: No acute abnormality on limited assessment. Other neck: No acute abnormality on limited assessment. Upper chest: Partially imaged left upper lobe consolidation. Partially imaged layering left greater than right pleural effusions. Review of the MIP images confirms the above findings CTA HEAD FINDINGS Anterior circulation: Bilateral intracranial ICAs, MCAs, and ACAs are patent without proximal hemodynamically significant stenosis. Posterior circulation: Bilateral intradural vertebral arteries, basilar artery and bilateral posterior cerebral arteries are patent without proximal hemodynamically significant stenosis. Venous sinuses: As permitted by contrast timing, patent. Review of the MIP images confirms the above findings IMPRESSION: 1. No large vessel occlusion or proximal hemodynamically significant stenosis. 2. Partially imaged left upper lobe consolidation, compatible with pneumonia that may be post-obstructive given known mass seen on prior PET-CT. A CT of the chest could further characterize if clinically warranted. 3. Partially imaged layering left greater than right pleural effusions. Electronically Signed   By: Feliberto Harts M.D.   On: 01/02/2024 19:15   ECHOCARDIOGRAM COMPLETE Result Date: 01/02/2024    ECHOCARDIOGRAM REPORT   Patient Name:   CARLEN FILS [facility] Date of Exam: 01/02/2024 Medical Rec #:  [id number]   Height:       68.0 in Accession #:    [id number]  Weight:       153.6 lb Date of Birth:  Sep 07, 1951    BSA:          1.827 m Patient Age:    73 years    BP:           94/70 mmHg Patient Gender: M           HR:           80 bpm. Exam Location:  [facility] Procedure: 2D  Echo, Cardiac Doppler and Color Doppler Indications:     Stroke I63.9  History:         Patient has no prior history of Echocardiogram examinations.                  Stroke.  Sonographer:     Lucendia Herrlich RCS Referring Phys:  [id number] Charise Killian Diagnosing Phys: Yvonne Kendall MD IMPRESSIONS  1. Left ventricular ejection fraction, by estimation, is 55 to 60%. The left ventricle has normal function. The left ventricle has no regional wall motion abnormalities. Left ventricular diastolic parameters were normal.  2. Right ventricular systolic function is normal. The right ventricular size is normal. There is normal pulmonary artery systolic pressure.  3. Right atrial size was mildly dilated.  4. The mitral valve is normal in structure. Moderate mitral valve regurgitation. No evidence of mitral stenosis.  5. Tricuspid valve regurgitation is mild to moderate.  6. The aortic valve has an indeterminant number of cusps. Aortic valve regurgitation is trivial. No aortic stenosis is present.  7. Aortic dilatation noted. There is borderline dilatation of the aortic root, measuring 38 mm. There is mild dilatation of the ascending aorta, measuring 38 mm.  8. The inferior vena  cava is normal in size with greater than 50% respiratory variability, suggesting right atrial pressure of 3 mmHg. FINDINGS  Left Ventricle: Left ventricular ejection fraction, by estimation, is 55 to 60%. The left ventricle has normal function. The left ventricle has no regional wall motion abnormalities. The left ventricular internal cavity size was normal in size. There is  no left ventricular hypertrophy. Left ventricular diastolic parameters were normal. Right Ventricle: The right ventricular size is normal. No increase in right ventricular wall thickness. Right ventricular systolic function is normal. There is normal pulmonary artery systolic pressure. The tricuspid regurgitant velocity is 2.78 m/s, and  with an assumed right atrial pressure  of 3 mmHg, the estimated right ventricular systolic pressure is 33.9 mmHg. Left Atrium: Left atrial size was normal in size. Right Atrium: Right atrial size was mildly dilated. Pericardium: Trivial pericardial effusion is present. Mitral Valve: The mitral valve is normal in structure. Mild mitral annular calcification. Moderate mitral valve regurgitation. No evidence of mitral valve stenosis. Tricuspid Valve: The tricuspid valve is normal in structure. Tricuspid valve regurgitation is mild to moderate. Aortic Valve: The aortic valve has an indeterminant number of cusps. Aortic valve regurgitation is trivial. No aortic stenosis is present. Aortic valve peak gradient measures 11.0 mmHg. Pulmonic Valve: The pulmonic valve was not well visualized. Pulmonic valve regurgitation is mild. No evidence of pulmonic stenosis. Aorta: Aortic dilatation noted. There is borderline dilatation of the aortic root, measuring 38 mm. There is mild dilatation of the ascending aorta, measuring 38 mm. Pulmonary Artery: The pulmonary artery is not well seen. Venous: The inferior vena cava is normal in size with greater than 50% respiratory variability, suggesting right atrial pressure of 3 mmHg. IAS/Shunts: The interatrial septum was not well visualized.  LEFT VENTRICLE PLAX 2D LVIDd:         5.20 cm   Diastology LVIDs:         3.70 cm   LV e' medial:    9.14 cm/s LV PW:         0.90 cm   LV E/e' medial:  11.4 LV IVS:        0.90 cm   LV e' lateral:   12.80 cm/s LVOT diam:     2.20 cm   LV E/e' lateral: 8.1 LV SV:         78 LV SV Index:   42 LVOT Area:     3.80 cm  RIGHT VENTRICLE             IVC RV S prime:     23.20 cm/s  IVC diam: 1.50 cm TAPSE (M-mode): 3.1 cm LEFT ATRIUM             Index        RIGHT ATRIUM           Index LA diam:        3.50 cm 1.92 cm/m   RA Area:     21.40 cm LA Vol (A2C):   47.8 ml 26.17 ml/m  RA Volume:   73.90 ml  40.45 ml/m LA Vol (A4C):   31.0 ml 16.94 ml/m LA Biplane Vol: 36.6 ml 20.03 ml/m  AORTIC  VALVE AV Area (Vmax): 2.66 cm AV Vmax:        166.00 cm/s AV Peak Grad:   11.0 mmHg LVOT Vmax:      116.00 cm/s LVOT Vmean:     76.600 cm/s LVOT VTI:       0.204 m  AORTA Ao Root diam: 3.80 cm Ao Asc diam:  3.80 cm MITRAL VALVE                TRICUSPID VALVE MV Area (PHT): 3.48 cm     TR Peak grad:   30.9 mmHg MV Decel Time: 218 msec     TR Vmax:        278.00 cm/s MR Peak grad: 78.8 mmHg MR Vmax:      443.75 cm/s   SHUNTS MV E velocity: 104.00 cm/s  Systemic VTI:  0.20 m MV A velocity: 88.60 cm/s   Systemic Diam: 2.20 cm MV E/A ratio:  1.17 Yvonne Kendall MD Electronically signed by Yvonne Kendall MD Signature Date/Time: 01/02/2024/6:29:56 PM    Final    MR Brain W Wo Contrast Result Date: 12/30/2023 CLINICAL DATA:  Non-small cell lung cancer, staging. EXAM: MRI HEAD WITHOUT AND WITH CONTRAST TECHNIQUE: Multiplanar, multiecho pulse sequences of the brain and surrounding structures were obtained without and with intravenous contrast. CONTRAST:  7mL GADAVIST GADOBUTROL 1 MMOL/ML IV SOLN COMPARISON:  CT head without contrast 12/28/2023. 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 Additional punctate cortical infarcts are present just superior to the largest area within the pre frontal cortex. Three separate punctate white matter scratched at 3 separate punctate acute white matter infarcts are present in the right corona radiata. A punctate area of acute infarction is present in the left lentiform nucleus. A punctate acute cortical infarct is present the left occipital pole. A 5 mm acute nonhemorrhagic infarct is present in the inferior right cerebellum. A punctate infarct is present inferiorly in the left cerebellum. Vascular: Flow is present in the major intracranial arteries. Skull and upper cervical spine: Craniocervical junction is normal. 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  No pathologic fractures are present. Sinuses/Orbits: The paranasal sinuses and mastoid air cells are clear. The globes and orbits are within normal limits. IMPRESSION: 1. 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 2. Additional small infarcts involving the more superior left frontal lobe, right corona radiata, left basal ganglia and bilateral cerebellum. This suggests a central source. 3. 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 No pathologic fractures are present. Electronically Signed   By: Marin Roberts M.D.   On: 12/30/2023 19:42   CT HEAD W & WO CONTRAST ( ) Result Date: 12/28/2023 CLINICAL DATA:  Brain metastases suspected. Newly diagnosed lung cancer. Headaches. EXAM: CT HEAD WITHOUT AND WITH CONTRAST TECHNIQUE: Contiguous axial images were obtained from the base of the skull through the vertex without and with intravenous contrast. RADIATION DOSE REDUCTION: This exam was performed according to the departmental dose-optimization program which includes automated exposure control, adjustment of the mA and/or kV according to patient size and/or use of iterative reconstruction technique. CONTRAST:  75mL OMNIPAQUE IOHEXOL 300 MG/ML  SOLN COMPARISON:  None Available. FINDINGS: Brain: There is no evidence of an acute infarct, intracranial hemorrhage, mass, midline shift, or extra-axial fluid collection. Cerebral volume is within normal limits for age. The ventricles are normal in size. Cerebral white matter hypodensities are nonspecific but compatible with mild chronic small vessel ischemic disease. No abnormal enhancement is identified. Vascular: The dural venous sinuses and large intracranial arteries are grossly patent. Skull: No fracture.  Scattered small skull lesions. Sinuses/Orbits: Visualized paranasal sinuses and mastoid air cells are clear.  Unremarkable orbits. Other: None. IMPRESSION: 1. No  evidence of intracranial metastases. 2. Scattered small skull lesions, suspicious for metastases given evidence of widespread osseous metastases on last month's PET-CT. 3. Mild chronic small vessel ischemic disease. Electronically Signed   By: Sebastian Ache M.D.   On: 12/28/2023 15:13   US Venous Img Lower Unilateral Right Result Date: 12/28/2023 CLINICAL DATA:  RLE edema, calf pain, lung cancer EXAM: RIGHT LOWER EXTREMITY VENOUS DOPPLER ULTRASOUND TECHNIQUE: Gray-scale sonography with compression, as well as color and duplex ultrasound, were performed to evaluate the deep venous system(s) from the level of the common femoral vein through the popliteal and proximal calf veins. COMPARISON:  PET-CT, 12/08/2023 FINDINGS: VENOUS Normal compressibility of the common femoral, superficial femoral, as well as the visualized calf veins. Visualized portions of profunda femoral vein and great saphenous vein unremarkable. Heterogeneously-echogenic, occlusive filling defects within and noncompressibility of the imaged portions of the RIGHT popliteal as well as the imaged calf veins. Limited views of the contralateral common femoral vein are unremarkable. OTHER No evidence of superficial thrombophlebitis or abnormal fluid collection. Limitations: none IMPRESSION: Acute RIGHT lower extremity DVT within the popliteal and calf veins. Roanna Banning, MD [facility] Electronically Signed   By: Roanna Banning M.D.   On: 12/28/2023 12:12   DG Chest Port 1 View Result Date: 12/21/2023 CLINICAL DATA:  Status post bronchoscopy. EXAM: PORTABLE CHEST 1 VIEW COMPARISON:  September 10, 2018.  December 08, 2023. FINDINGS: Normal cardiac size. Left perihilar and upper lobe opacity is noted concerning for atelectasis or infiltrate and underlying neoplasm. No pneumothorax is noted status post bronchoscopy. Right lungs clear. IMPRESSION: Left perihilar and upper lobe opacities noted concerning  for atelectasis or infiltrate and underlying neoplasm as noted on prior CT scan. No pneumothorax status post bronchoscopy. Electronically Signed   By: Lupita Raider M.D.   On: 12/21/2023 10:25   DG C-ARM BRONCHOSCOPY Result Date: 12/21/2023 C-ARM BRONCHOSCOPY: Fluoroscopy was utilized by the requesting physician.  No radiographic interpretation.   NM PET Image Initial (PI) Skull Base To Thigh (F-18 FDG) Result Date: 12/16/2023 CLINICAL DATA:  Initial treatment strategy for left upper lobe pulmonary mass. EXAM: NUCLEAR MEDICINE PET SKULL BASE TO THIGH TECHNIQUE: 8.61 mCi F-18 FDG was injected intravenously. Full-ring PET imaging was performed from the skull base to thigh after the radiotracer. CT data was obtained and used for attenuation correction and anatomic localization. Fasting blood glucose: 97 mg/dl COMPARISON:  Chest CT, same date. FINDINGS: Mediastinal blood pool activity: SUV max 2.13 Liver activity: SUV max NA NECK: No hypermetabolic lymph nodes in the neck. Incidental CT findings: Scattered calcifications in the tonsillar regions consistent with prior inflammation or infection. CHEST: The left upper lobe pulmonary lesion adjacent to the major fissure is hypermetabolic with SUV max of 6.0 and consistent with primary lung neoplasm. There is hypermetabolic mediastinal lymphadenopathy. Right paratracheal node has an SUV max of 4.87. Left hilar disease which could be direct extension of tumor or adenopathy has an SUV max of 6.66. This surrounds the left upper lobe bronchus and partially occludes it. Subcarinal node has an SUV max of 5.36. Left pleural effusion but no hypermetabolic pleural lesions. Incidental CT findings: Stable vascular calcifications. Small left pleural effusion. ABDOMEN/PELVIS: No abnormal hypermetabolic activity within the liver, pancreas, adrenal glands, or spleen. No hypermetabolic lymph nodes in the abdomen or pelvis. Incidental CT findings: Moderate atherosclerotic  calcifications involving the aorta and branch vessels but no aneurysm. Scattered colonic  diverticulosis. SKELETON: Diffuse scattered hypermetabolic bone lesions consistent with widespread metastatic bone disease. Sternal lesion has an SUV max of 3.42 Right sixth rib lesion has an SUV max of 4.59 T7 lesion has an SUV max of 5.26 Right iliac bone lesion has an SUV max of 5.89. Left hip/bus or trochanteric lesion has an SUV max of 4.39. Incidental CT findings: None. IMPRESSION: 1. Hypermetabolic left upper lobe pulmonary mass invading the left hilum consistent with primary lung neoplasm. 2. Hypermetabolic mediastinal and left hilar lymphadenopathy consistent with metastatic disease. 3. Diffuse scattered hypermetabolic bone lesions consistent with widespread metastatic bone disease. 4. No findings for metastatic disease involving the abdomen/pelvis. 5. Small left pleural effusion but no hypermetabolic pleural lesions. 6. Aortic atherosclerosis. Aortic Atherosclerosis (ICD10-I70.0). Electronically Signed   By: Rudie Meyer M.D.   On: 12/16/2023 17:36   CT SUPER D CHEST WO CONTRAST Result Date: 12/16/2023 CLINICAL DATA:  Left upper lobe pulmonary nodule. EXAM: CT CHEST WITHOUT CONTRAST TECHNIQUE: Multidetector CT imaging of the chest was performed using thin slice collimation for electromagnetic bronchoscopy planning purposes, without intravenous contrast. RADIATION DOSE REDUCTION: This exam was performed according to the departmental dose-optimization program which includes automated exposure control, adjustment of the mA and/or kV according to patient size and/or use of iterative reconstruction technique. COMPARISON:  11/07/2023 FINDINGS: Cardiovascular: The heart size is normal. No substantial pericardial effusion. Ascending thoracic aorta measures 4.1 cm diameter. Mild atherosclerotic calcification is noted in the wall of the thoracic aorta. Mediastinum/Nodes: Scattered normal and upper normal sized lymph nodes  are seen in the mediastinum including 9 mm short axis right paratracheal node on 67/2. Calcified nodal tissue is seen in the left hilum with abnormal soft tissue density in the left suprahilar region encasing the left upper lobe bronchus. The esophagus has normal imaging features.There is no axillary lymphadenopathy. Lungs/Pleura: Centrilobular emphsyema noted. 3 mm right middle lobe nodule on image 130/4 is new in the interval. 5 mm subpleural right upper lobe nodule on 73/4 is stable. 4 mm right upper lobe nodule on 61/4 is new. 2 mm right upper lobe nodule on 41/4 has decreased from 4 mm previously. 5 mm subpleural right lower lobe nodule on 91/4 is stable. Soft tissue fullness in the right hilum and suprahilar region is progressive in the interval, encasing the bronchi to the apical in lingular segments of the left upper lobe. 6.7 x 3.5 cm masslike consolidative opacity in the posterior left upper lobe is new in the interval. This is associated with anteromedial left upper lobe volume loss adjacent to the mediastinum. Calcified granulomata noted left base. Small left pleural effusion is new in the interval. Upper Abdomen: No acute findings. 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 Musculoskeletal: No worrisome lytic or sclerotic osseous abnormality. IMPRESSION: 1. Progressive soft tissue fullness in the left hilum and suprahilar region, encasing the bronchi to the apical and lingular segments of the left upper lobe. This soft tissue is confluent with a 6.7 x 3.5 cm masslike consolidative opacity in the posterior left upper lobe, new in the interval. Imaging features raise concern for neoplasm with volume loss in the posterior and anteromedial left upper lobe. 2. New small left pleural effusion. 3. Waxing and waning of small pulmonary nodules in the right lung. Continued close attention on follow-up recommended. 4. Aortic Atherosclerosis (ICD10-I70.0) and Emphysema  (ICD10-J43.9). Electronically Signed   By: Kennith Center M.D.   On: 12/16/2023 14:03    Assessment and plan-   #  Acute/subacute nonhemorrhagic infarct with additional multiple small infarcts, suspect embolic etiology. This may be secondary to the hypercoagulable state due to stage IV lung cancer. Agree with anticoagulation with heparin drip with no bolus as per neurology. Finish stroke workup.  Appreciate neurology evaluation.   # Right lower extremity DVT, agree with heparin gtt. at discharge, consider switching to Xarelto.  He has a high co-pay with Eliquis.   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  NGS and PD-L1 are pending.   I had a lengthy discussion with patient and his wife.  They are not decided and are interested in getting more information for chemotherapy.  Will set up chemotherapy education class outpatient.  Patient knows to contact office if he decides to proceed with treatments.  # Smoke cessation recommended. # Elevated alkaline phosphatase, secondary to bone metastasis.  Consider outpatient bisphosphonate treatments if he is interested.  Thank you for allowing me to participate in the care of this patient.   Rickard Patience, MD, PhD Hematology Oncology 01/02/2024

## 2024-01-02 NOTE — Progress Notes (Signed)
PT Cancellation Note  Patient Details Name: RIYANSH GERSTNER MRN: [id number] DOB: 1951/09/01   Cancelled Treatment:    Reason Eval/Treat Not Completed: PT screened, no needs identified, will sign off (Consult received and chart reviewed.  Patient currently indep without assist device for all mobility; denies change in strength, sensation, balance or mobility.  No acute PT needs identified; patient at baseline level of functional ability. Will complete orders at this time; please reconsult should needs change.)  Iris Tatsch H. Manson Passey, PT, DPT, NCS 01/02/24, 11:26 AM [phone]

## 2024-01-02 NOTE — Progress Notes (Signed)
PROGRESS NOTE   HPI: 73 y/o M w/ PMH of stage IV lung cancer, smoker, HTN, insomnia who presents after being told to come to the hospital by his oncologist, Dr. Cathie Hoops, for CVA found on outpatient MRI. Pt denies any deficits including difficulty walking, talking, swallowing, weakness. Pt does not use a cane, walker or wheelchair. Pt denies any fever, chills, sweating, chest pain, shortness of breath, nausea, vomiting, abd pain, dysuria, urinary urgency, urinary frequency, diarrhea or constipation. Pt smokes about 1 ppd x 33 years. Also, pt c/o intermittent productive cough x 6 months.    Joe Morton  QMV:[id number] DOB: October 20, 1951 DOA: 01/01/2024 PCP: Emilio Aspen, MD    Assessment & Plan:   Principal Problem:   CVA (cerebral vascular accident) [facility]) Active Problems:   Acute deep vein thrombosis (DVT) of right lower extremity (HCC)   Adenocarcinoma of lung, stage 4 (HCC)  Assessment and Plan:  CVA: acute/subacute nonhemorrhagic infarct w/ additional small infarcts in left frontal lobe, right corona radiata, left basal ganglia & b/l cerebellum. Continue on tele. Continue w/ neuro checks. CTA head and neck ordered as per neuro. Echo ordered. PT & OT recs no follow-up. Neuro following and recs apprec    RLE DVT: dx outpatient was taking eliquis. Holding eliquis, secondary to above. Continue on IV heparin drip  Stage IV lung cancer: recently dx. MRI brain shows multiple enhancing metastases in upper C-spine & skull. Onco following and recs apprec   Smoker: smokes 1ppd x 33 years. Pt declines a nicotine patch    Elevated alkaline phosphatase: etiology unclear. ? Metastatic to liver. Will continue to monitor   Leukocytosis: likely reactive. Trending down    HTN: pt stopped taking amlodipine himself. BP is on low end of normal    Insomnia: continue on home dose of remeron      DVT prophylaxis: IV heparin drip Code Status: DNR Family Communication: discussed pt's care w/ pt's  family at bedside and answered their questions  Disposition Plan: likely d/c back home   Level of care: Med-Surg  Status is: Observation The patient remains OBS appropriate and will d/c before 2 midnights. Still receiving CVA work-up & on IV heparin drip    Consultants:  Onco  Neuro   Procedures:   Antimicrobials:  Subjective: Pt c/o intermittent cough   Objective: Vitals:   01/01/24 1639 01/01/24 2000 01/02/24 0000 01/02/24 0400  BP: 111/82 115/84 115/79 96/76  Pulse: 98 97 88 73  Resp: 18 17 18 16   Temp: 97.7 F (36.5 C) 100.1 F (37.8 C) 99.4 F (37.4 C) 97.7 F (36.5 C)  TempSrc:  Oral Oral Oral  SpO2: 93% 94% 93% 93%  Height:       No intake or output data in the 24 hours ending 01/02/24 0812 There were no vitals filed for this visit.  Examination:  General exam: Appears calm and comfortable  Respiratory system: decreased breath sounds b/l Cardiovascular system: S1 & S2 +. No rubs, gallops or clicks Gastrointestinal system: Abdomen is nondistended, soft and nontender. Normal bowel sounds heard. Central nervous system: Alert and oriented. Moves all extremities  Psychiatry: Judgement and insight appear normal. Mood & affect appropriate.     Data Reviewed: I have personally reviewed following labs and imaging studies  CBC: Recent Labs  Lab 01/01/24 1029 01/02/24 0523  WBC 12.6* 11.9*  NEUTROABS 9.2*  --   HGB 16.9 16.6  HCT 50.3 47.6  MCV 94.5 91.0  PLT 155 147*  Basic Metabolic Panel: Recent Labs  Lab 01/01/24 1029 01/02/24 0523  NA 139 140  K 4.1 4.5  CL 100 99  CO2 27 28  GLUCOSE 110* 89  BUN 17 18  CREATININE 1.11 1.08  CALCIUM 10.5* 10.1   GFR: Estimated Creatinine Clearance: 60.7 mL/min (by C-G formula based on SCr of 1.08 mg/dL). Liver Function Tests: Recent Labs  Lab 01/01/24 1029 01/02/24 0523  AST 44* 43*  ALT 31 32  ALKPHOS 769* 722*  BILITOT 0.9 0.3  PROT 7.2 6.7  ALBUMIN 3.6 3.2*   No results for input(s):  "LIPASE", "AMYLASE" in the last 168 hours. No results for input(s): "AMMONIA" in the last 168 hours. Coagulation Profile: Recent Labs  Lab 01/01/24 1029  INR 1.6*   Cardiac Enzymes: No results for input(s): "CKTOTAL", "CKMB", "CKMBINDEX", "TROPONINI" in the last 168 hours. BNP (last 3 results) No results for input(s): "PROBNP" in the last 8760 hours. HbA1C: No results for input(s): "HGBA1C" in the last 72 hours. CBG: No results for input(s): "GLUCAP" in the last 168 hours. Lipid Profile: No results for input(s): "CHOL", "HDL", "LDLCALC", "TRIG", "CHOLHDL", "LDLDIRECT" in the last 72 hours. Thyroid Function Tests: No results for input(s): "TSH", "T4TOTAL", "FREET4", "T3FREE", "THYROIDAB" in the last 72 hours. Anemia Panel: No results for input(s): "VITAMINB12", "FOLATE", "FERRITIN", "TIBC", "IRON", "RETICCTPCT" in the last 72 hours. Sepsis Labs: No results for input(s): "PROCALCITON", "LATICACIDVEN" in the last 168 hours.  No results found for this or any previous visit (from the past 240 hours).       Radiology Studies: No results found.      Scheduled Meds:  feeding supplement  237 mL Oral BID BM   mirtazapine  7.5 mg Oral QHS   sodium chloride flush  3 mL Intravenous Q12H   Continuous Infusions:  heparin 900 Units/hr (01/01/24 2129)     LOS: 1 day       Charise Killian, MD [facility] Pager [phone]  If 7PM-7AM, please contact night-coverage  01/02/2024, 8:12 AM

## 2024-01-03 ENCOUNTER — Other Ambulatory Visit: Payer: Medicare Other

## 2024-01-03 ENCOUNTER — Other Ambulatory Visit: Payer: Self-pay

## 2024-01-03 ENCOUNTER — Other Ambulatory Visit (HOSPITAL_COMMUNITY): Payer: Self-pay

## 2024-01-03 DIAGNOSIS — I63133 Cerebral infarction due to embolism of bilateral carotid arteries: Secondary | ICD-10-CM

## 2024-01-03 DIAGNOSIS — I6310 Cerebral infarction due to embolism of unspecified precerebral artery: Secondary | ICD-10-CM | POA: Diagnosis not present

## 2024-01-03 DIAGNOSIS — I639 Cerebral infarction, unspecified: Secondary | ICD-10-CM | POA: Diagnosis not present

## 2024-01-03 DIAGNOSIS — I82401 Acute embolism and thrombosis of unspecified deep veins of right lower extremity: Secondary | ICD-10-CM | POA: Diagnosis not present

## 2024-01-03 DIAGNOSIS — C3412 Malignant neoplasm of upper lobe, left bronchus or lung: Secondary | ICD-10-CM | POA: Diagnosis not present

## 2024-01-03 DIAGNOSIS — C3490 Malignant neoplasm of unspecified part of unspecified bronchus or lung: Secondary | ICD-10-CM | POA: Diagnosis not present

## 2024-01-03 LAB — HEMOGLOBIN A1C
Hgb A1c MFr Bld: 5.7 % — ABNORMAL HIGH (ref 4.8–5.6)
Mean Plasma Glucose: 116.89 mg/dL

## 2024-01-03 LAB — APTT
aPTT: 64 s — ABNORMAL HIGH (ref 24–36)
aPTT: 70 s — ABNORMAL HIGH (ref 24–36)

## 2024-01-03 LAB — CBC
HCT: 48.6 % (ref 39.0–52.0)
Hemoglobin: 16.5 g/dL (ref 13.0–17.0)
MCH: 31.6 pg (ref 26.0–34.0)
MCHC: 34 g/dL (ref 30.0–36.0)
MCV: 93.1 fL (ref 80.0–100.0)
Platelets: 146 10*3/uL — ABNORMAL LOW (ref 150–400)
RBC: 5.22 MIL/uL (ref 4.22–5.81)
RDW: 12.9 % (ref 11.5–15.5)
WBC: 12.1 10*3/uL — ABNORMAL HIGH (ref 4.0–10.5)
nRBC: 0 % (ref 0.0–0.2)

## 2024-01-03 LAB — LIPID PANEL
Cholesterol: 192 mg/dL (ref 0–200)
HDL: 40 mg/dL — ABNORMAL LOW (ref >40)
LDL Cholesterol: 114 mg/dL — ABNORMAL HIGH (ref 0–99)
Total CHOL/HDL Ratio: 4.8 {ratio}
Triglycerides: 192 mg/dL — ABNORMAL HIGH (ref <150)
VLDL: 38 mg/dL (ref 0–40)

## 2024-01-03 LAB — HEPARIN LEVEL (UNFRACTIONATED): Heparin Unfractionated: 0.88 [IU]/mL — ABNORMAL HIGH (ref 0.30–0.70)

## 2024-01-03 MED ORDER — RIVAROXABAN 20 MG PO TABS: 20.0000 mg | ORAL_TABLET | Freq: Every day | ORAL | Status: DC

## 2024-01-03 MED ORDER — RIVAROXABAN 15 MG PO TABS
15.0000 mg | ORAL_TABLET | Freq: Two times a day (BID) | ORAL | Status: DC
  Filled 2024-01-03: qty 1

## 2024-01-03 MED ORDER — RIVAROXABAN 20 MG PO TABS
20.0000 mg | ORAL_TABLET | Freq: Every day | ORAL | 1 refills | Status: DC
  Filled 2024-01-03: qty 21, 21d supply, fill #0

## 2024-01-03 MED ORDER — RIVAROXABAN (XARELTO) VTE STARTER PACK (15 & 20 MG)
ORAL_TABLET | ORAL | 0 refills | Status: DC
  Filled 2024-01-03: qty 51, 30d supply, fill #0
  Filled 2024-01-03: qty 42, 21d supply, fill #0

## 2024-01-03 NOTE — Progress Notes (Signed)
NEUROLOGY CONSULT FOLLOW UP NOTE   Date of service: January 03, 2024 Patient Name: Joe Morton MRN:  [id number] DOB:  [birth year]  Interval Hx/subjective   - Patient remains asymptomatic from strokes with NIHSS = 0 - No new neurologic complaints  Vitals   Vitals:   01/03/24 0040 01/03/24 0525 01/03/24 0800 01/03/24 1231  BP:  98/67 95/76 97/72   Pulse:  79 68 84  Resp:  16 18 17   Temp:  97.6 F (36.4 C) 98 F (36.7 C) 98.6 F (37 C)  TempSrc:   Oral   SpO2: 92% 91% 92% 91%  Height:         Body mass index is 23.35 kg/m.  Physical Exam   Gen: A&Ox4, NAD HEENT: Atraumatic, normocephalic; oropharynx clear, tongue without atrophy or fasciculations. Resp: CTAB, normal work of breathing CV: RRR, extremities appear well-perfused. Abd: soft/NT/ND Extrem: Nml bulk; no cyanosis, clubbing, or edema.  Neurologic Examination   *MS: A&O x4. Follows multi-step commands.  *Speech: no dysarthria or aphasia, able to name and repeat. *CN:    I: Deferred   II,III: PERRLA, VFF by confrontation, optic discs not visualized 2/2 pupillary constriction   III,IV,VI: EOMI w/o nystagmus, no ptosis   V: Sensation intact from V1 to V3 to LT   VII: Eyelid closure was full.  Smile symmetric.   VIII: Hearing intact to voice   IX,X: Voice normal, palate elevates symmetrically    XI: SCM/trap 5/5 bilat   XII: Tongue protrudes midline, no atrophy or fasciculations  *Motor:   Normal bulk.  No tremor, rigidity or bradykinesia. No pronator drift.   Strength: Dlt Bic Tri WE WrF FgS Gr HF KnF KnE PlF DoF    Left 5 5 5 5 5 5 5 5 5 5 5 5     Right 5 5 5 5 5 5 5 5 5 5 5 5    *Sensory: Intact to light touch, pinprick, temperature vibration throughout. Symmetric. Propioception intact bilat.  No double-simultaneous extinction.  *Coordination:  Finger-to-nose, heel-to-shin, rapid alternating motions were intact. *Reflexes:  2+ and symmetric throughout without clonus; toes down-going bilat *Gait:  deferred  NIHSS = 0  Medications  Current Facility-Administered Medications:    acetaminophen (TYLENOL) tablet 650 mg, 650 mg, Oral, Q6H PRN **OR** acetaminophen (TYLENOL) suppository 650 mg, 650 mg, Rectal, Q6H PRN, Charise Killian, MD   feeding supplement (ENSURE ENLIVE / ENSURE PLUS) liquid 237 mL, 237 mL, Oral, BID BM, Charise Killian, MD, 237 mL at 01/03/24 1425   guaiFENesin-dextromethorphan (ROBITUSSIN DM) 100-10 MG/5ML syrup 5 mL, 5 mL, Oral, Q4H PRN, Charise Killian, MD, 5 mL at 01/02/24 2204   HYDROcodone-acetaminophen (NORCO/VICODIN) 5-325 MG per tablet 1-2 tablet, 1-2 tablet, Oral, Q4H PRN, Charise Killian, MD, 2 tablet at 01/02/24 2204   mirtazapine (REMERON) tablet 7.5 mg, 7.5 mg, Oral, QHS, Charise Killian, MD, 7.5 mg at 01/02/24 2159   morphine (PF) 2 MG/ML injection 1 mg, 1 mg, Intravenous, Q4H PRN, Charise Killian, MD   ondansetron [facility]) tablet 4 mg, 4 mg, Oral, Q6H PRN **OR** ondansetron (ZOFRAN) injection 4 mg, 4 mg, Intravenous, Q6H PRN, Charise Killian, MD   Rivaroxaban (XARELTO) tablet 15 mg, 15 mg, Oral, BID WC **FOLLOWED BY** [START ON 01/24/2024] rivaroxaban (XARELTO) tablet 20 mg, 20 mg, Oral, Q supper, Hunt, Madison H, RPH   senna-docusate (Senokot-S) tablet 1 tablet, 1 tablet, Oral, QHS PRN, Charise Killian, MD, 1 tablet at 01/02/24 2204   sodium chloride  flush (NS) 0.9 % injection 3 mL, 3 mL, Intravenous, Q12H, Charise Killian, MD, 3 mL at 01/03/24 0937  Labs and Diagnostic Imaging   CBC:  Recent Labs  Lab 01/01/24 1029 01/02/24 0523 01/03/24 0606  WBC 12.6* 11.9* 12.1*  NEUTROABS 9.2*  --   --   HGB 16.9 16.6 16.5  HCT 50.3 47.6 48.6  MCV 94.5 91.0 93.1  PLT 155 147* 146*    Basic Metabolic Panel:  Lab Results  Component Value Date   NA 140 01/02/2024   K 4.5 01/02/2024   CO2 28 01/02/2024   GLUCOSE 89 01/02/2024   BUN 18 01/02/2024   CREATININE 1.08 01/02/2024   CALCIUM 10.1 01/02/2024   GFRNONAA >60  01/02/2024   GFRAA >60 09/10/2018   Lipid Panel:  Lab Results  Component Value Date   LDLCALC 114 (H) 01/03/2024   HgbA1c: No results found for: "HGBA1C" Urine Drug Screen: No results found for: "LABOPIA", "COCAINSCRNUR", "LABBENZ", "AMPHETMU", "THCU", "LABBARB"  Alcohol Level     Component Value Date/Time   ETH <10 01/01/2024 1029   INR  Lab Results  Component Value Date   INR 1.6 (H) 01/01/2024   APTT  Lab Results  Component Value Date   APTT 64 (H) 01/03/2024   AED levels: No results found for: "PHENYTOIN", "ZONISAMIDE", "LAMOTRIGINE", "LEVETIRACETA"  CT angio Head and Neck with contrast(Personally reviewed): 1. No large vessel occlusion or proximal hemodynamically significant stenosis. 2. Partially imaged left upper lobe consolidation, compatible with pneumonia that may be post-obstructive given known mass seen on prior PET-CT. A CT of the chest could further characterize if clinically warranted. 3. Partially imaged layering left greater than right pleural effusions.  MRI Brain(Personally reviewed): 1. 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 2. Additional small infarcts involving the more superior left frontal lobe, right corona radiata, left basal ganglia and bilateral cerebellum. This suggests a central source. 3. 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 No pathologic fractures are present.  TTE 1. Left ventricular ejection fraction, by estimation, is 55 to 60% . The left ventricle has normal function. The left ventricle has no regional wall motion abnormalities. Left ventricular diastolic parameters were normal. 2. Right ventricular systolic function is normal. The right ventricular size is normal. There is normal pulmonary artery systolic pressure. 3. Right atrial size was mildly dilated. 4. The mitral valve is normal in structure. Moderate mitral valve  regurgitation. No evidence of mitral stenosis. 5. Tricuspid valve regurgitation is mild to moderate. 6. The aortic valve has an indeterminant number of cusps. Aortic valve regurgitation is trivial. No aortic stenosis is present. 7. Aortic dilatation noted. There is borderline dilatation of the aortic root, measuring 38 mm. There is mild dilatation of the ascending aorta, measuring 38 mm. 8. The inferior vena cava is normal in size with greater than 50% respiratory variability, suggesting right atrial pressure of 3 mmHg.  Assessment   73 yo man with newly diagnosed Stage IV NSCLC and dx admitted for incidental findings of multifocal acute infarcts on outpatient MRI brain 12/30/23 that was ordered for the purpose of staging.  He was and remains asymptomatic from the strokes with NIHSS = 0. Strokes are embolic with acute infarcts bilaterally and in both the anterior and posterior circulations. Etiology of stroke is favored to be paradoxical embolism in the setting of known DVT (just diagnosed, had not started eliquis before MRI showed acute infarcts),  although cardioembolic source cannot be excluded. TTE showed no e/o intracardiac clot. Onc recommends at least 3-6 mos of anticoagulation and he may be transitioned to DAPT after that if his cancer is able to be controlled. Recommend outpatient cardiac monitor at discharge bc if a fib is found that would change the recommended anticoagulation duration to lifelong.  Recommendations   - D/c heparin gtt - OK to discharge today on xarelto (more affordable for patient than eliquis) - No neurologic indication for antiplatelet in addition to therapeutic anticoagulation - Ambulatory cardiac monitoring - I will arrange outpatient neuro f/u - F/u with onc as scheduled ______________________________________________________________________   Signed, Jefferson Fuel, MD [facility]

## 2024-01-03 NOTE — Plan of Care (Signed)

## 2024-01-03 NOTE — Progress Notes (Signed)
PHARMACY - ANTICOAGULATION CONSULT NOTE  Pharmacy Consult for heparin infusion Indication: RLE DVT ISO CVA  No Known Allergies  Patient Measurements: Height: 5\' 8"  (172.7 cm) IBW/kg (Calculated) : 68.4 Heparin Dosing Weight: 69.7 kg  Vital Signs: Temp: 97.5 F (36.4 C) (02/12 0036) BP: 100/66 (02/12 0036) Pulse Rate: 73 (02/12 0036)  Labs: Recent Labs    01/01/24 1029 01/02/24 0523 01/02/24 1400 01/03/24 0003  HGB 16.9 16.6  --   --   HCT 50.3 47.6  --   --   PLT 155 147*  --   --   APTT 38* 69* 54* 70*  LABPROT 19.5*  --   --   --   INR 1.6*  --   --   --   HEPARINUNFRC  --  >1.10*  --   --   CREATININE 1.11 1.08  --   --     Estimated Creatinine Clearance: 60.7 mL/min (by C-G formula based on SCr of 1.08 mg/dL).   Medical History: Past Medical History:  Diagnosis Date   Emphysema lung (HCC)    Hypertension     Assessment: 73 y/o M w/ PMH of stage IV lung cancer, smoker, HTN, insomnia who presents after being told to come to the hospital by his oncologist. He was on apixaban prior to arrival with last dose   01/01/24 at approximately 0900  Baseline labs: H&H/PLT wnl, aPTT 38s, INR 1.6  2/11 0523 aPTT 69, HL >1.1; therapeutic and not correlating  2/11 1400 aPTT 54, subtherapeutic  2/11 0003 aPTT 70, therapeutic x 1  Goal of Therapy:  Heparin level 0.3-0.5 units/ml aPTT 66 - 85 seconds Monitor platelets by anticoagulation protocol: Yes   Plan:  ---aPTT therapeutic x 1 ---Continue heparin infusion rate at 1000 units/hr (no loading dose ISO indication and recent apixaban administration) ---Check aPTT and HL level in 8 hours (we will use aPTT to guide therapy until aPTT and heparin level correlate) ---Continue to monitor H&H and platelets  Bettey Costa, PharmD Clinical Pharmacist 01/03/2024,12:43 AM

## 2024-01-03 NOTE — Discharge Summary (Addendum)
 Joe Morton:[id number] DOB: [birth year] DOA: 01/01/2024  PCP: Emilio Aspen, MD  Admit date: 01/01/2024 Discharge date: 01/03/2024  Time spent: 35 minutes  Recommendations for Outpatient Follow-up:  Pcp f/u Neurology f/u (referral placed) Oncology f/u Cardiology f/u in about 6 weeks  F/u LDL (result pending at time of discharge), may need statin.    Discharge Diagnoses:  Principal Problem:   CVA (cerebral vascular accident) (HCC) Active Problems:   Acute deep vein thrombosis (DVT) of right lower extremity (HCC)   Adenocarcinoma of lung, stage 4 (HCC)   Discharge Condition: stable  Diet recommendation: heart healthy  There were no vitals filed for this visit.  History of present illness:  From admission h and p 73 y/o M w/ PMH of stage IV lung cancer, smoker, HTN, insomnia who presents after being told to come to the hospital by his oncologist, Dr. Cathie Hoops, for CVA found on outpatient MRI. Pt denies any deficits including difficulty walking, talking, swallowing, weakness. Pt does not use a cane, walker or wheelchair. Pt denies any fever, chills, sweating, chest pain, shortness of breath, nausea, vomiting, abd pain, dysuria, urinary urgency, urinary frequency, diarrhea or constipation. Pt smokes about 1 ppd x 33 years. Also, pt c/o intermittent productive cough x 6 months.   Hospital Course:  Patient was recently diagnosed with stage 4 lung cancer. Also recently diagnosed with RLE DVT, started on apixaban. Was referred to the ER when staging MRI showed acute small infarcts. He is asymptomatic. Neurology consulted. CTA no large vessel disease. Oncology thinks likely secondary to malignancy prothrombotic state. No events on tele, no thrombus seen on TTE. Discharged with ziopatch and transition to xarelto as appears that will be more affordable to patient than the apixaban that was recently started. Referred to neurology. LDL pending at time of discharge, will need to f/u this result  and start statin if above 70. BPs soft here, will continue to hold amlodipine.   Procedures: none   Consultations: Neurology, oncology  Discharge Exam: Vitals:   01/03/24 0800 01/03/24 1231  BP: 95/76 97/72  Pulse: 68 84  Resp: 18 17  Temp: 98 F (36.7 C) 98.6 F (37 C)  SpO2: 92% 91%    General: NAD Cardiovascular: RRR Respiratory: CTAB  Discharge Instructions   Discharge Instructions     Ambulatory referral to Neurology   Complete by: As directed    Diet - low sodium heart healthy   Complete by: As directed    Increase activity slowly   Complete by: As directed       Allergies as of 01/03/2024   No Known Allergies      Medication List     STOP taking these medications    amLODipine 5 MG tablet Commonly known as: NORVASC   Apixaban Starter Pack (10mg  and 5mg ) Commonly known as: ELIQUIS STARTER PACK       TAKE these medications    Acetaminophen 500 MG capsule Take 500 mg by mouth every 6 (six) hours as needed for mild pain (pain score 1-3) or moderate pain (pain score 4-6).   HYDROcodone-acetaminophen 5-325 MG tablet Commonly known as: Norco Take 1 tablet by mouth every 6 (six) hours as needed for moderate pain (pain score 4-6).   mirtazapine 7.5 MG tablet Commonly known as: REMERON Take 1 tablet (7.5 mg total) by mouth at bedtime.   ondansetron 8 MG tablet Commonly known as: ZOFRAN Take 1 tablet (8 mg total) by mouth every 8 (eight) hours as needed  for nausea or vomiting.   Rivaroxaban 15 MG Tabs tablet Commonly known as: XARELTO Take 1 tablet (15 mg total) by mouth 2 (two) times daily. For three weeks, then start 20 mg daily for additional three weeks   rivaroxaban 20 MG Tabs tablet Commonly known as: XARELTO Take 1 tablet (20 mg total) by mouth daily. For three weeks after taking 15 mg twice a day for three weeks       No Known Allergies  Follow-up Information     Kathryne Gin, MD. Go in 6 week(s).   Specialty:  Cardiology Why: Follow up scheduled for 02/13/2024 at 2:30 PM Contact information: [address] [place] [address] [phone]         Emilio Aspen, MD Follow up.   Specialty: Internal Medicine Contact information: [address] [place] [address] [phone]         Rickard Patience, MD Follow up.   Specialty: Oncology Contact information: [address] [place] [address] [phone]                  The results of significant diagnostics from this hospitalization (including imaging, microbiology, ancillary and laboratory) are listed below for reference.    Significant Diagnostic Studies: CT ANGIO HEAD NECK W WO CM Result Date: 01/02/2024 CLINICAL DATA:  Stroke/TIA, determine embolic source EXAM: CT ANGIOGRAPHY HEAD AND NECK WITH AND WITHOUT CONTRAST TECHNIQUE: Multidetector CT imaging of the head and neck was performed using the standard protocol during bolus administration of intravenous contrast. Multiplanar CT image reconstructions and MIPs were obtained to evaluate the vascular anatomy. Carotid stenosis measurements (when applicable) are obtained utilizing NASCET criteria, using the distal internal carotid diameter as the denominator. RADIATION DOSE REDUCTION: This exam was performed according to the departmental dose-optimization program which includes automated exposure control, adjustment of the mA and/or kV according to patient size and/or use of iterative reconstruction technique. CONTRAST:  75mL OMNIPAQUE IOHEXOL 350 MG/ML SOLN COMPARISON:  None Available. FINDINGS: CT HEAD FINDINGS Brain: Known acute infarcts better characterized on recent MRI. No progressive mass effect or acute hemorrhage. No midline shift. No hydrocephalus. Skull: Known metastases better seen on recent MRI. Sinuses/orbits: No acute abnormality. CTA NECK FINDINGS Aortic arch: Great vessel origins are patent without significant stenosis. Right carotid  system: Atherosclerosis at the carotid bifurcation without greater than 50% stenosis. Left carotid system: Atherosclerosis at the carotid bifurcation without greater than 50% stenosis. Vertebral arteries: Right-dominant. No evidence of dissection, stenosis (50% or greater), or occlusion. Skeleton: No acute abnormality on limited assessment. Other neck: No acute abnormality on limited assessment. Upper chest: Partially imaged left upper lobe consolidation. Partially imaged layering left greater than right pleural effusions. Review of the MIP images confirms the above findings CTA HEAD FINDINGS Anterior circulation: Bilateral intracranial ICAs, MCAs, and ACAs are patent without proximal hemodynamically significant stenosis. Posterior circulation: Bilateral intradural vertebral arteries, basilar artery and bilateral posterior cerebral arteries are patent without proximal hemodynamically significant stenosis. Venous sinuses: As permitted by contrast timing, patent. Review of the MIP images confirms the above findings IMPRESSION: 1. No large vessel occlusion or proximal hemodynamically significant stenosis. 2. Partially imaged left upper lobe consolidation, compatible with pneumonia that may be post-obstructive given known mass seen on prior PET-CT. A CT of the chest could further characterize if clinically warranted. 3. Partially imaged layering left greater than right pleural effusions. Electronically Signed   By: Feliberto Harts M.D.   On: 01/02/2024 19:15  ECHOCARDIOGRAM COMPLETE Result Date: 01/02/2024    ECHOCARDIOGRAM REPORT   Patient Name:   URI TURNBOUGH [facility] Date of Exam: 01/02/2024 Medical Rec #:  [id number]   Height:       68.0 in Accession #:    [id number]  Weight:       153.6 lb Date of Birth:  [birth year]    BSA:          1.827 m Patient Age:    73 years    BP:           94/70 mmHg Patient Gender: M           HR:           80 bpm. Exam Location:  [facility] Procedure: 2D Echo, Cardiac Doppler and Color Doppler  Indications:     Stroke I63.9  History:         Patient has no prior history of Echocardiogram examinations.                  Stroke.  Sonographer:     Lucendia Herrlich RCS Referring Phys:  [id number] Charise Killian Diagnosing Phys: Yvonne Kendall MD IMPRESSIONS  1. Left ventricular ejection fraction, by estimation, is 55 to 60%. The left ventricle has normal function. The left ventricle has no regional wall motion abnormalities. Left ventricular diastolic parameters were normal.  2. Right ventricular systolic function is normal. The right ventricular size is normal. There is normal pulmonary artery systolic pressure.  3. Right atrial size was mildly dilated.  4. The mitral valve is normal in structure. Moderate mitral valve regurgitation. No evidence of mitral stenosis.  5. Tricuspid valve regurgitation is mild to moderate.  6. The aortic valve has an indeterminant number of cusps. Aortic valve regurgitation is trivial. No aortic stenosis is present.  7. Aortic dilatation noted. There is borderline dilatation of the aortic root, measuring 38 mm. There is mild dilatation of the ascending aorta, measuring 38 mm.  8. The inferior vena cava is normal in size with greater than 50% respiratory variability, suggesting right atrial pressure of 3 mmHg. FINDINGS  Left Ventricle: Left ventricular ejection fraction, by estimation, is 55 to 60%. The left ventricle has normal function. The left ventricle has no regional wall motion abnormalities. The left ventricular internal cavity size was normal in size. There is  no left ventricular hypertrophy. Left ventricular diastolic parameters were normal. Right Ventricle: The right ventricular size is normal. No increase in right ventricular wall thickness. Right ventricular systolic function is normal. There is normal pulmonary artery systolic pressure. The tricuspid regurgitant velocity is 2.78 m/s, and  with an assumed right atrial pressure of 3 mmHg, the estimated right  ventricular systolic pressure is 33.9 mmHg. Left Atrium: Left atrial size was normal in size. Right Atrium: Right atrial size was mildly dilated. Pericardium: Trivial pericardial effusion is present. Mitral Valve: The mitral valve is normal in structure. Mild mitral annular calcification. Moderate mitral valve regurgitation. No evidence of mitral valve stenosis. Tricuspid Valve: The tricuspid valve is normal in structure. Tricuspid valve regurgitation is mild to moderate. Aortic Valve: The aortic valve has an indeterminant number of cusps. Aortic valve regurgitation is trivial. No aortic stenosis is present. Aortic valve peak gradient measures 11.0 mmHg. Pulmonic Valve: The pulmonic valve was not well visualized. Pulmonic valve regurgitation is mild. No evidence of pulmonic stenosis. Aorta: Aortic dilatation noted. There is borderline dilatation of the aortic root, measuring 38 mm. There is mild dilatation of the  ascending aorta, measuring 38 mm. Pulmonary Artery: The pulmonary artery is not well seen. Venous: The inferior vena cava is normal in size with greater than 50% respiratory variability, suggesting right atrial pressure of 3 mmHg. IAS/Shunts: The interatrial septum was not well visualized.  LEFT VENTRICLE PLAX 2D LVIDd:         5.20 cm   Diastology LVIDs:         3.70 cm   LV e' medial:    9.14 cm/s LV PW:         0.90 cm   LV E/e' medial:  11.4 LV IVS:        0.90 cm   LV e' lateral:   12.80 cm/s LVOT diam:     2.20 cm   LV E/e' lateral: 8.1 LV SV:         78 LV SV Index:   42 LVOT Area:     3.80 cm  RIGHT VENTRICLE             IVC RV S prime:     23.20 cm/s  IVC diam: 1.50 cm TAPSE (M-mode): 3.1 cm LEFT ATRIUM             Index        RIGHT ATRIUM           Index LA diam:        3.50 cm 1.92 cm/m   RA Area:     21.40 cm LA Vol (A2C):   47.8 ml 26.17 ml/m  RA Volume:   73.90 ml  40.45 ml/m LA Vol (A4C):   31.0 ml 16.94 ml/m LA Biplane Vol: 36.6 ml 20.03 ml/m  AORTIC VALVE AV Area (Vmax): 2.66 cm AV  Vmax:        166.00 cm/s AV Peak Grad:   11.0 mmHg LVOT Vmax:      116.00 cm/s LVOT Vmean:     76.600 cm/s LVOT VTI:       0.204 m  AORTA Ao Root diam: 3.80 cm Ao Asc diam:  3.80 cm MITRAL VALVE                TRICUSPID VALVE MV Area (PHT): 3.48 cm     TR Peak grad:   30.9 mmHg MV Decel Time: 218 msec     TR Vmax:        278.00 cm/s MR Peak grad: 78.8 mmHg MR Vmax:      443.75 cm/s   SHUNTS MV E velocity: 104.00 cm/s  Systemic VTI:  0.20 m MV A velocity: 88.60 cm/s   Systemic Diam: 2.20 cm MV E/A ratio:  1.17 Yvonne Kendall MD Electronically signed by Yvonne Kendall MD Signature Date/Time: 01/02/2024/6:29:56 PM    Final    MR Brain W Wo Contrast Result Date: 12/30/2023 CLINICAL DATA:  Non-small cell lung cancer, staging. EXAM: MRI HEAD WITHOUT AND WITH CONTRAST TECHNIQUE: Multiplanar, multiecho pulse sequences of the brain and surrounding structures were obtained without and with intravenous contrast. CONTRAST:  7mL GADAVIST GADOBUTROL 1 MMOL/ML IV SOLN COMPARISON:  CT head without contrast 12/28/2023. 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 Additional punctate cortical infarcts are present just superior to the largest area within the pre frontal cortex. Three separate punctate white matter scratched at 3 separate punctate acute white matter infarcts are present in the right corona radiata. A punctate area of acute infarction is present in the left lentiform nucleus. A  punctate acute cortical infarct is present the left occipital pole. A 5 mm acute nonhemorrhagic infarct is present in the inferior right cerebellum. A punctate infarct is present inferiorly in the left cerebellum. Vascular: Flow is present in the major intracranial arteries. Skull and upper cervical spine: Craniocervical junction is normal. 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 No pathologic fractures are  present. Sinuses/Orbits: The paranasal sinuses and mastoid air cells are clear. The globes and orbits are within normal limits. IMPRESSION: 1. 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 2. Additional small infarcts involving the more superior left frontal lobe, right corona radiata, left basal ganglia and bilateral cerebellum. This suggests a central source. 3. 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 No pathologic fractures are present. Electronically Signed   By: Marin Roberts M.D.   On: 12/30/2023 19:42   CT HEAD W & WO CONTRAST ( ) Result Date: 12/28/2023 CLINICAL DATA:  Brain metastases suspected. Newly diagnosed lung cancer. Headaches. EXAM: CT HEAD WITHOUT AND WITH CONTRAST TECHNIQUE: Contiguous axial images were obtained from the base of the skull through the vertex without and with intravenous contrast. RADIATION DOSE REDUCTION: This exam was performed according to the departmental dose-optimization program which includes automated exposure control, adjustment of the mA and/or kV according to patient size and/or use of iterative reconstruction technique. CONTRAST:  75mL OMNIPAQUE IOHEXOL 300 MG/ML  SOLN COMPARISON:  None Available. FINDINGS: Brain: There is no evidence of an acute infarct, intracranial hemorrhage, mass, midline shift, or extra-axial fluid collection. Cerebral volume is within normal limits for age. The ventricles are normal in size. Cerebral white matter hypodensities are nonspecific but compatible with mild chronic small vessel ischemic disease. No abnormal enhancement is identified. Vascular: The dural venous sinuses and large intracranial arteries are grossly patent. Skull: No fracture.  Scattered small skull lesions. Sinuses/Orbits: Visualized paranasal sinuses and mastoid air cells are clear. Unremarkable orbits. Other: None. IMPRESSION: 1. No evidence of intracranial  metastases. 2. Scattered small skull lesions, suspicious for metastases given evidence of widespread osseous metastases on last month's PET-CT. 3. Mild chronic small vessel ischemic disease. Electronically Signed   By: Sebastian Ache M.D.   On: 12/28/2023 15:13   US Venous Img Lower Unilateral Right Result Date: 12/28/2023 CLINICAL DATA:  RLE edema, calf pain, lung cancer EXAM: RIGHT LOWER EXTREMITY VENOUS DOPPLER ULTRASOUND TECHNIQUE: Gray-scale sonography with compression, as well as color and duplex ultrasound, were performed to evaluate the deep venous system(s) from the level of the common femoral vein through the popliteal and proximal calf veins. COMPARISON:  PET-CT, 12/08/2023 FINDINGS: VENOUS Normal compressibility of the common femoral, superficial femoral, as well as the visualized calf veins. Visualized portions of profunda femoral vein and great saphenous vein unremarkable. Heterogeneously-echogenic, occlusive filling defects within and noncompressibility of the imaged portions of the RIGHT popliteal as well as the imaged calf veins. Limited views of the contralateral common femoral vein are unremarkable. OTHER No evidence of superficial thrombophlebitis or abnormal fluid collection. Limitations: none IMPRESSION: Acute RIGHT lower extremity DVT within the popliteal and calf veins. Roanna Banning, MD [facility] Electronically Signed   By: Roanna Banning M.D.   On: 12/28/2023 12:12   DG Chest Port 1 View Result Date: 12/21/2023 CLINICAL DATA:  Status post bronchoscopy. EXAM: PORTABLE CHEST 1 VIEW COMPARISON:  September 10, 2018.  December 08, 2023. FINDINGS:  Normal cardiac size. Left perihilar and upper lobe opacity is noted concerning for atelectasis or infiltrate and underlying neoplasm. No pneumothorax is noted status post bronchoscopy. Right lungs clear. IMPRESSION: Left perihilar and upper lobe opacities noted concerning for atelectasis or  infiltrate and underlying neoplasm as noted on prior CT scan. No pneumothorax status post bronchoscopy. Electronically Signed   By: Lupita Raider M.D.   On: 12/21/2023 10:25   DG C-ARM BRONCHOSCOPY Result Date: 12/21/2023 C-ARM BRONCHOSCOPY: Fluoroscopy was utilized by the requesting physician.  No radiographic interpretation.   NM PET Image Initial (PI) Skull Base To Thigh (F-18 FDG) Result Date: 12/16/2023 CLINICAL DATA:  Initial treatment strategy for left upper lobe pulmonary mass. EXAM: NUCLEAR MEDICINE PET SKULL BASE TO THIGH TECHNIQUE: 8.61 mCi F-18 FDG was injected intravenously. Full-ring PET imaging was performed from the skull base to thigh after the radiotracer. CT data was obtained and used for attenuation correction and anatomic localization. Fasting blood glucose: 97 mg/dl COMPARISON:  Chest CT, same date. FINDINGS: Mediastinal blood pool activity: SUV max 2.13 Liver activity: SUV max NA NECK: No hypermetabolic lymph nodes in the neck. Incidental CT findings: Scattered calcifications in the tonsillar regions consistent with prior inflammation or infection. CHEST: The left upper lobe pulmonary lesion adjacent to the major fissure is hypermetabolic with SUV max of 6.0 and consistent with primary lung neoplasm. There is hypermetabolic mediastinal lymphadenopathy. Right paratracheal node has an SUV max of 4.87. Left hilar disease which could be direct extension of tumor or adenopathy has an SUV max of 6.66. This surrounds the left upper lobe bronchus and partially occludes it. Subcarinal node has an SUV max of 5.36. Left pleural effusion but no hypermetabolic pleural lesions. Incidental CT findings: Stable vascular calcifications. Small left pleural effusion. ABDOMEN/PELVIS: No abnormal hypermetabolic activity within the liver, pancreas, adrenal glands, or spleen. No hypermetabolic lymph nodes in the abdomen or pelvis. Incidental CT findings: Moderate atherosclerotic calcifications involving the  aorta and branch vessels but no aneurysm. Scattered colonic diverticulosis. SKELETON: Diffuse scattered hypermetabolic bone lesions consistent with widespread metastatic bone disease. Sternal lesion has an SUV max of 3.42 Right sixth rib lesion has an SUV max of 4.59 T7 lesion has an SUV max of 5.26 Right iliac bone lesion has an SUV max of 5.89. Left hip/bus or trochanteric lesion has an SUV max of 4.39. Incidental CT findings: None. IMPRESSION: 1. Hypermetabolic left upper lobe pulmonary mass invading the left hilum consistent with primary lung neoplasm. 2. Hypermetabolic mediastinal and left hilar lymphadenopathy consistent with metastatic disease. 3. Diffuse scattered hypermetabolic bone lesions consistent with widespread metastatic bone disease. 4. No findings for metastatic disease involving the abdomen/pelvis. 5. Small left pleural effusion but no hypermetabolic pleural lesions. 6. Aortic atherosclerosis. Aortic Atherosclerosis (ICD10-I70.0). Electronically Signed   By: Rudie Meyer M.D.   On: 12/16/2023 17:36   CT SUPER D CHEST WO CONTRAST Result Date: 12/16/2023 CLINICAL DATA:  Left upper lobe pulmonary nodule. EXAM: CT CHEST WITHOUT CONTRAST TECHNIQUE: Multidetector CT imaging of the chest was performed using thin slice collimation for electromagnetic bronchoscopy planning purposes, without intravenous contrast. RADIATION DOSE REDUCTION: This exam was performed according to the departmental dose-optimization program which includes automated exposure control, adjustment of the mA and/or kV according to patient size and/or use of iterative reconstruction technique. COMPARISON:  11/07/2023 FINDINGS: Cardiovascular: The heart size is normal. No substantial pericardial effusion. Ascending thoracic aorta measures 4.1 cm diameter. Mild atherosclerotic calcification is noted in the wall of the thoracic aorta. Mediastinum/Nodes:  Scattered normal and upper normal sized lymph nodes are seen in the mediastinum  including 9 mm short axis right paratracheal node on 67/2. Calcified nodal tissue is seen in the left hilum with abnormal soft tissue density in the left suprahilar region encasing the left upper lobe bronchus. The esophagus has normal imaging features.There is no axillary lymphadenopathy. Lungs/Pleura: Centrilobular emphsyema noted. 3 mm right middle lobe nodule on image 130/4 is new in the interval. 5 mm subpleural right upper lobe nodule on 73/4 is stable. 4 mm right upper lobe nodule on 61/4 is new. 2 mm right upper lobe nodule on 41/4 has decreased from 4 mm previously. 5 mm subpleural right lower lobe nodule on 91/4 is stable. Soft tissue fullness in the right hilum and suprahilar region is progressive in the interval, encasing the bronchi to the apical in lingular segments of the left upper lobe. 6.7 x 3.5 cm masslike consolidative opacity in the posterior left upper lobe is new in the interval. This is associated with anteromedial left upper lobe volume loss adjacent to the mediastinum. Calcified granulomata noted left base. Small left pleural effusion is new in the interval. Upper Abdomen: No acute findings. 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 Musculoskeletal: No worrisome lytic or sclerotic osseous abnormality. IMPRESSION: 1. Progressive soft tissue fullness in the left hilum and suprahilar region, encasing the bronchi to the apical and lingular segments of the left upper lobe. This soft tissue is confluent with a 6.7 x 3.5 cm masslike consolidative opacity in the posterior left upper lobe, new in the interval. Imaging features raise concern for neoplasm with volume loss in the posterior and anteromedial left upper lobe. 2. New small left pleural effusion. 3. Waxing and waning of small pulmonary nodules in the right lung. Continued close attention on follow-up recommended. 4. Aortic Atherosclerosis (ICD10-I70.0) and Emphysema (ICD10-J43.9). Electronically  Signed   By: Kennith Center M.D.   On: 12/16/2023 14:03    Microbiology: No results found for this or any previous visit (from the past 240 hours).   Labs: Basic Metabolic Panel: Recent Labs  Lab 01/01/24 1029 01/02/24 0523  NA 139 140  K 4.1 4.5  CL 100 99  CO2 27 28  GLUCOSE 110* 89  BUN 17 18  CREATININE 1.11 1.08  CALCIUM 10.5* 10.1   Liver Function Tests: Recent Labs  Lab 01/01/24 1029 01/02/24 0523  AST 44* 43*  ALT 31 32  ALKPHOS 769* 722*  BILITOT 0.9 0.3  PROT 7.2 6.7  ALBUMIN 3.6 3.2*   No results for input(s): "LIPASE", "AMYLASE" in the last 168 hours. No results for input(s): "AMMONIA" in the last 168 hours. CBC: Recent Labs  Lab 01/01/24 1029 01/02/24 0523 01/03/24 0606  WBC 12.6* 11.9* 12.1*  NEUTROABS 9.2*  --   --   HGB 16.9 16.6 16.5  HCT 50.3 47.6 48.6  MCV 94.5 91.0 93.1  PLT 155 147* 146*   Cardiac Enzymes: No results for input(s): "CKTOTAL", "CKMB", "CKMBINDEX", "TROPONINI" in the last 168 hours. BNP: BNP (last 3 results) No results for input(s): "BNP" in the last 8760 hours.  ProBNP (last 3 results) No results for input(s): "PROBNP" in the last 8760 hours.  CBG: No results for input(s): "GLUCAP" in the last 168 hours.     Signed:  Silvano Bilis MD.  [facility] 01/03/2024, 2:36 PM

## 2024-01-03 NOTE — TOC CM/SW Note (Signed)
Transition of Care [facility]) - Inpatient Brief Assessment   Patient Details  Name: RAKIM MOONE MRN: [id number] Date of Birth: Oct 30, 1951  Transition of Care [facility]) CM/SW Contact:    Allena Katz, LCSW Phone Number: 01/03/2024, 2:31 PM   Clinical Narrative:    Transition of Care Asessment: Insurance and Status: Insurance coverage has been reviewed Patient has primary care physician: Yes Home environment has been reviewed: Address  [address] [place] [address] Prior level of function:: No PT/OT needs. Prior/Current Home Services: No current home services Social Drivers of Health Review: SDOH reviewed no interventions necessary Readmission risk has been reviewed: Yes Transition of care needs: no transition of care needs at this time

## 2024-01-03 NOTE — Progress Notes (Signed)
PHARMACY - ANTICOAGULATION CONSULT NOTE  Pharmacy Consult for Rivaroxaban Indication: RLE DVT ISO CVA  No Known Allergies  Patient Measurements: Height: 5\' 8"  (172.7 cm) IBW/kg (Calculated) : 68.4 Heparin Dosing Weight: 69.7 kg  Vital Signs: Temp: 98.6 F (37 C) (02/12 1231) Temp Source: Oral (02/12 0800) BP: 97/72 (02/12 1231) Pulse Rate: 84 (02/12 1231)  Labs: Recent Labs    01/01/24 1029 01/02/24 0523 01/02/24 1400 01/03/24 0003 01/03/24 0606 01/03/24 0802  HGB 16.9 16.6  --   --  16.5  --   HCT 50.3 47.6  --   --  48.6  --   PLT 155 147*  --   --  146*  --   APTT 38* 69* 54* 70*  --  64*  LABPROT 19.5*  --   --   --   --   --   INR 1.6*  --   --   --   --   --   HEPARINUNFRC  --  >1.10*  --   --   --  0.88*  CREATININE 1.11 1.08  --   --   --   --     Estimated Creatinine Clearance: 60.7 mL/min (by C-G formula based on SCr of 1.08 mg/dL).   Medical History: Past Medical History:  Diagnosis Date   Emphysema lung (HCC)    Hypertension     Assessment: 73 y/o M w/ PMH of stage IV lung cancer, smoker, HTN, insomnia who presents after being told to come to the hospital by his oncologist. He was on apixaban prior to arrival. While inpatient, patient has been receiving a heparin infusion. Pharmacy has been consulted to transition patient to rivaroxaban.   Baseline labs: H&H/PLT wnl, aPTT 38s, INR 1.6  Goal of Therapy:  Monitor platelets by anticoagulation protocol: Yes   Plan:  ---Start rivaroxaban 15 mg PO BID with meals for 21 days followed by 20 mg PO  once daily with largest meal of the day ---Monitor CBC and SCr weekly unless more frequent monitoring is needed  Merryl Hacker, PharmD Clinical Pharmacist 01/03/2024,2:10 PM

## 2024-01-03 NOTE — Progress Notes (Signed)
PHARMACY - ANTICOAGULATION CONSULT NOTE  Pharmacy Consult for heparin infusion Indication: RLE DVT ISO CVA  No Known Allergies  Patient Measurements: Height: 5\' 8"  (172.7 cm) IBW/kg (Calculated) : 68.4 Heparin Dosing Weight: 69.7 kg  Vital Signs: Temp: 98 F (36.7 C) (02/12 0800) Temp Source: Oral (02/12 0800) BP: 95/76 (02/12 0800) Pulse Rate: 68 (02/12 0800)  Labs: Recent Labs    01/01/24 1029 01/02/24 0523 01/02/24 1400 01/03/24 0003 01/03/24 0606 01/03/24 0802  HGB 16.9 16.6  --   --  16.5  --   HCT 50.3 47.6  --   --  48.6  --   PLT 155 147*  --   --  146*  --   APTT 38* 69* 54* 70*  --  64*  LABPROT 19.5*  --   --   --   --   --   INR 1.6*  --   --   --   --   --   HEPARINUNFRC  --  >1.10*  --   --   --  0.88*  CREATININE 1.11 1.08  --   --   --   --     Estimated Creatinine Clearance: 60.7 mL/min (by C-G formula based on SCr of 1.08 mg/dL).   Medical History: Past Medical History:  Diagnosis Date   Emphysema lung (HCC)    Hypertension     Assessment: 73 y/o M w/ PMH of stage IV lung cancer, smoker, HTN, insomnia who presents after being told to come to the hospital by his oncologist. He was on apixaban prior to arrival with last dose   01/01/24 at approximately 0900  Baseline labs: H&H/PLT wnl, aPTT 38s, INR 1.6  2/11 0523 aPTT 69, HL >1.1; therapeutic and not correlating  2/11 1400 aPTT 54, subtherapeutic  2/12 0003 aPTT 70, therapeutic x 1 2/12 0802 aPTT 64, HL 0.88; subtherapeutic and not correlating   Goal of Therapy:  Heparin level 0.3-0.5 units/ml aPTT 66 - 85 seconds Monitor platelets by anticoagulation protocol: Yes   Plan:  ---aPTT subtherapeutic and HL not correlating  ---Increase heparin infusion rate to 1050 units/hr (no loading dose ISO indication and recent apixaban administration) ---Check aPTT and HL level in 8 hours (we will use aPTT to guide therapy until aPTT and heparin level correlate) ---Continue to monitor H&H and  platelets  Merryl Hacker, PharmD Clinical Pharmacist 01/03/2024,9:03 AM

## 2024-01-04 ENCOUNTER — Encounter: Payer: Self-pay | Admitting: Oncology

## 2024-01-05 ENCOUNTER — Encounter: Payer: Self-pay | Admitting: *Deleted

## 2024-01-05 ENCOUNTER — Other Ambulatory Visit (HOSPITAL_COMMUNITY): Payer: Self-pay

## 2024-01-05 ENCOUNTER — Inpatient Hospital Stay: Payer: Medicare Other | Admitting: Oncology

## 2024-01-05 ENCOUNTER — Telehealth: Payer: Self-pay | Admitting: Pharmacy Technician

## 2024-01-05 ENCOUNTER — Telehealth: Payer: Self-pay | Admitting: Pharmacist

## 2024-01-05 ENCOUNTER — Inpatient Hospital Stay (HOSPITAL_BASED_OUTPATIENT_CLINIC_OR_DEPARTMENT_OTHER): Payer: Medicare Other | Admitting: Hospice and Palliative Medicine

## 2024-01-05 ENCOUNTER — Other Ambulatory Visit: Payer: Medicare Other

## 2024-01-05 ENCOUNTER — Encounter: Payer: Self-pay | Admitting: Oncology

## 2024-01-05 VITALS — BP 101/89 | HR 97 | Temp 96.6°F | Resp 18 | Wt 148.3 lb

## 2024-01-05 DIAGNOSIS — I82401 Acute embolism and thrombosis of unspecified deep veins of right lower extremity: Secondary | ICD-10-CM | POA: Diagnosis not present

## 2024-01-05 DIAGNOSIS — C3412 Malignant neoplasm of upper lobe, left bronchus or lung: Secondary | ICD-10-CM | POA: Diagnosis not present

## 2024-01-05 DIAGNOSIS — C7951 Secondary malignant neoplasm of bone: Secondary | ICD-10-CM | POA: Diagnosis not present

## 2024-01-05 DIAGNOSIS — D72829 Elevated white blood cell count, unspecified: Secondary | ICD-10-CM

## 2024-01-05 DIAGNOSIS — F17210 Nicotine dependence, cigarettes, uncomplicated: Secondary | ICD-10-CM | POA: Diagnosis not present

## 2024-01-05 DIAGNOSIS — Z7189 Other specified counseling: Secondary | ICD-10-CM

## 2024-01-05 DIAGNOSIS — Z515 Encounter for palliative care: Secondary | ICD-10-CM | POA: Diagnosis not present

## 2024-01-05 DIAGNOSIS — I6310 Cerebral infarction due to embolism of unspecified precerebral artery: Secondary | ICD-10-CM | POA: Diagnosis not present

## 2024-01-05 MED ORDER — HYDROCODONE-ACETAMINOPHEN 5-325 MG PO TABS: 1.0000 | ORAL_TABLET | Freq: Four times a day (QID) | ORAL | 0 refills | Status: DC | PRN

## 2024-01-05 MED ORDER — HYDROCOD POLI-CHLORPHE POLI ER 10-8 MG/5ML PO SUER: 5.0000 mL | Freq: Every evening | ORAL | 0 refills | Status: DC | PRN

## 2024-01-05 MED ORDER — HYDROCODONE-ACETAMINOPHEN 5-325 MG PO TABS
1.0000 | ORAL_TABLET | ORAL | Status: DC | PRN
Start: 2024-01-05 — End: 2024-01-16

## 2024-01-05 MED ORDER — MEGESTROL ACETATE 40 MG PO TABS: 80.0000 mg | ORAL_TABLET | Freq: Two times a day (BID) | ORAL | 0 refills | Status: DC

## 2024-01-05 NOTE — Telephone Encounter (Signed)
Oral Oncology Patient Advocate Encounter   Began application for assistance for Tagrisso through [employer].   Application will be submitted upon completion of necessary supporting documentation.   [employer] phone number [phone].   I will continue to check the status until final determination.   Patty Almedia Balls, CPhT Oncology Pharmacy Patient Advocate [facility] Direct Number: [phone] Fax: [phone]

## 2024-01-05 NOTE — Assessment & Plan Note (Addendum)
Continue Xarelto starter package Refer to Child psychotherapist - no prescription coverage

## 2024-01-05 NOTE — Assessment & Plan Note (Signed)
Predominantly neutrophilia. Likely reactive due to cancer, smoking

## 2024-01-05 NOTE — Progress Notes (Signed)
 Palliative Medicine [facility] at [facility] [phone] [phone]   Name: Joe Morton Date: 01/05/2024 MRN: [id number]  DOB: Dec 13, 1950  Patient Care Team: Emilio Aspen, MD as PCP - General (Internal Medicine) Rickard Patience, MD as Consulting Physician (Oncology) Glory Buff, RN as Oncology Nurse Navigator    REASON FOR CONSULTATION: Joe Morton is a 73 y.o. 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  Patient met with medical oncology was unsure if he wished to proceed with cancer treatment.  Was referred to palliative care to address goals and manage any ongoing symptoms.  SOCIAL HISTORY:     reports that he has been smoking cigarettes. He started smoking about 44 years ago. He has a 44.1 pack-year smoking history. He has never used smokeless tobacco. He reports current alcohol use of about 6.0 standard drinks of alcohol per week. He reports that he does not use drugs.  Patient lives at home with his wife of 52 years.  Has a daughter and granddaughter in [place].  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  ADVANCE DIRECTIVES:    CODE STATUS: DNR/DNI (MOST form signed on 01/05/24)  PAST MEDICAL HISTORY: Past Medical History:  Diagnosis Date   Emphysema lung (HCC)    Hypertension     PAST SURGICAL HISTORY:  Past Surgical History:  Procedure Laterality Date   BRONCHIAL BIOPSY  12/21/2023   Procedure: BRONCHIAL BIOPSIES;  Surgeon: [name], MD;  Location: [facility];  Service: Pulmonary;;   BRONCHIAL NEEDLE ASPIRATION BIOPSY  12/21/2023   Procedure: BRONCHIAL NEEDLE ASPIRATION BIOPSIES;  Surgeon: [name], MD;  Location: [facility];  Service: Pulmonary;;   BRONCHIAL WASHINGS  12/21/2023   Procedure: BRONCHIAL WASHINGS;  Surgeon: [name], MD;  Location: [facility]  ENDOSCOPY;  Service: Pulmonary;;   COLONOSCOPY     ENDOBRONCHIAL ULTRASOUND Bilateral 12/21/2023   Procedure: ENDOBRONCHIAL ULTRASOUND;  Surgeon: [name], MD;  Location: [facility];  Service: Pulmonary;  Laterality: Bilateral;   FINE NEEDLE ASPIRATION  12/21/2023   Procedure: FINE NEEDLE ASPIRATION (FNA) LINEAR;  Surgeon: [name], MD;  Location: [facility];  Service: Pulmonary;;   INGUINAL HERNIA REPAIR Left 09/17/2018   Procedure: OPEN LEFT INGUINAL HERNIA REPAIR WITH MESH;  Surgeon: Jimmye Norman, MD;  Location: [facility];  Service: General;  Laterality: Left;  GENERAL AND LMA   INSERTION OF MESH Left 09/17/2018   Procedure: INSERTION OF MESH;  Surgeon: Jimmye Norman, MD;  Location: [facility] OR;  Service: General;  Laterality: Left;  GENERAL AND LMA    HEMATOLOGY/ONCOLOGY HISTORY:  Oncology History  Malignant neoplasm of upper lobe of left lung (HCC)  11/11/2023 Imaging   CT angio chest aorta w contrast  1. 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 Recommend annual imaging followup by CTA or MRA. This recommendation follows 2010 ACCF/AHA/AATS/ACR/ASA/SCA/SCAI/SIR/STS/SVM Guidelines for the Diagnosis and Management of Patients with Thoracic Aortic Disease. Circulation. 2010; 121: F751-W258. Aortic aneurysm NOS (ICD10-I71.9) 2. Stable tortuosity of the aortic arch. 3. Significant emphysematous lung disease with diffuse perihilar bronchial thickening bilaterally. 4. There is an area of spiculation at a convergence point of posterior left upper lobe bronchi over a region measuring roughly 1.9 x 1.3 x 2.0 cm. This is also associated with adjacent nodularity and thickening of the adjacent major fissure over  several cm. This was not present in 2019. Although this may be secondary to inflammation/infection, malignancy is not excluded and further follow-up and workup may be indicated including short-term  interval follow-up CT, possible PET scan and consideration of bronchoscopy targeted to this region. Referral to Pulmonology for follow-up is recommended. 5. Numerous small scattered mediastinal lymph nodes are present. The largest measures roughly 8 mm in short axis at the level of the AP window. 6. Probable hepatic steatosis.    12/16/2023 Imaging   CT super D chest wo contrast  1. Progressive soft tissue fullness in the left hilum and suprahilar region, encasing the bronchi to the apical and lingular segments of the left upper lobe. This soft tissue is confluent with a 6.7 x 3.5 cm masslike consolidative opacity in the posterior left upper lobe, new in the interval. Imaging features raise concern for neoplasm with volume loss in the posterior and anteromedial left upper lobe. 2. New small left pleural effusion. 3. Waxing and waning of small pulmonary nodules in the right lung. Continued close attention on follow-up recommended. 4. Aortic Atherosclerosis (ICD10-I70.0) and Emphysema (ICD10-J43.9   12/16/2023 Imaging   PET scan showed  1. Hypermetabolic left upper lobe pulmonary mass invading the left hilum consistent with primary lung neoplasm. 2. Hypermetabolic mediastinal and left hilar lymphadenopathy consistent with metastatic disease. 3. Diffuse scattered hypermetabolic bone lesions consistent with widespread metastatic bone disease. 4. No findings for metastatic disease involving the abdomen/pelvis. 5. Small left pleural effusion but no hypermetabolic pleural lesions. 6. Aortic atherosclerosis.   Aortic Atherosclerosis (ICD10-I70.0).   12/21/2023 Initial Diagnosis   Malignant neoplasm of upper lobe of left lung (HCC)  Biopsy via bronchoscopy showed  A. LUNG, LUL, FINE NEEDLE ASPIRATION  BIOPSY:  - Non-small cell carcinoma , TTF 1 positive, p40 and cytokeratin 5/6 negative, consistent with adenocarcinoma.   B. LYMPH NODE, STATION 11R INFERIOR, FINE NEEDLE ASPIRATION:   - Rare atypical cells suspicious for carcinoma   C. LYMPH NODE, STATION 7, FINE NEEDLE ASPIRATION:  - Non-small cell carcinoma   D. LUNG, LUL, LAVAGE:  FINAL MICROSCOPIC DIAGNOSIS:  - No malignant cells identified  - Benign bronchial cells and pulmonary macrophages      12/25/2023 Cancer Staging   Staging form: Lung, AJCC V9 - Clinical: Stage IVB (cT3, cN2, cM1c1) - Signed by Rickard Patience, MD on 12/25/2023 Stage prefix: Initial diagnosis     ALLERGIES:  has no known allergies.  MEDICATIONS:  Current Outpatient Medications  Medication Sig Dispense Refill   chlorpheniramine-HYDROcodone (TUSSIONEX) 10-8 MG/5ML Take 5 mLs by mouth at bedtime as needed for cough. 70 mL 0   Acetaminophen 500 MG capsule Take 500 mg by mouth every 6 (six) hours as needed for mild pain (pain score 1-3) or moderate pain (pain score 4-6).     HYDROcodone-acetaminophen (NORCO) 5-325 MG tablet Take 1 tablet by mouth every 4 (four) hours as needed for moderate pain (pain score 4-6).     megestrol (MEGACE) 40 MG tablet Take 2 tablets (80 mg total) by mouth 2 (two) times daily. 120 tablet 0   mirtazapine (REMERON) 7.5 MG tablet Take 1 tablet (7.5 mg total) by mouth at bedtime. 30 tablet 2   ondansetron (ZOFRAN) 8 MG tablet Take 1 tablet (8 mg total) by mouth every 8 (eight) hours as needed for nausea or vomiting. 20 tablet 0   rivaroxaban (XARELTO) 20 MG TABS tablet Take 1 tablet (20 mg total) by mouth daily. For three weeks after taking 15 mg  twice a day for three weeks 21 tablet 1   RIVAROXABAN (XARELTO) VTE STARTER PACK (15 & 20 MG) Take 15mg  by mouth 2 (two) times daily for 21 days, then take 20mg  by mouth daily for 9 days. 51 each 0   No current facility-administered medications for this visit.    VITAL SIGNS: There were no vitals taken for this visit. There were no vitals filed for this visit.  Estimated body mass index is 22.55 kg/m as calculated from the following:   Height as of 01/01/24: 5\' 8"  (1.727  m).   Weight as of an earlier encounter on 01/05/24: 148 lb 4.8 oz (67.3 kg).  LABS: CBC:    Component Value Date/Time   WBC 12.1 (H) 01/03/2024 0606   HGB 16.5 01/03/2024 0606   HCT 48.6 01/03/2024 0606   PLT 146 (L) 01/03/2024 0606   MCV 93.1 01/03/2024 0606   NEUTROABS 9.2 (H) 01/01/2024 1029   LYMPHSABS 1.2 01/01/2024 1029   MONOABS 1.4 (H) 01/01/2024 1029   EOSABS 0.4 01/01/2024 1029   BASOSABS 0.1 01/01/2024 1029   Comprehensive Metabolic Panel:    Component Value Date/Time   NA 140 01/02/2024 0523   K 4.5 01/02/2024 0523   CL 99 01/02/2024 0523   CO2 28 01/02/2024 0523   BUN 18 01/02/2024 0523   CREATININE 1.08 01/02/2024 0523   GLUCOSE 89 01/02/2024 0523   CALCIUM 10.1 01/02/2024 0523   AST 43 (H) 01/02/2024 0523   ALT 32 01/02/2024 0523   ALKPHOS 722 (H) 01/02/2024 0523   BILITOT 0.3 01/02/2024 0523   PROT 6.7 01/02/2024 0523   ALBUMIN 3.2 (L) 01/02/2024 0523    RADIOGRAPHIC STUDIES: CT ANGIO HEAD NECK W WO CM Result Date: 01/02/2024 CLINICAL DATA:  Stroke/TIA, determine embolic source EXAM: CT ANGIOGRAPHY HEAD AND NECK WITH AND WITHOUT CONTRAST TECHNIQUE: Multidetector CT imaging of the head and neck was performed using the standard protocol during bolus administration of intravenous contrast. Multiplanar CT image reconstructions and MIPs were obtained to evaluate the vascular anatomy. Carotid stenosis measurements (when applicable) are obtained utilizing NASCET criteria, using the distal internal carotid diameter as the denominator. RADIATION DOSE REDUCTION: This exam was performed according to the departmental dose-optimization program which includes automated exposure control, adjustment of the mA and/or kV according to patient size and/or use of iterative reconstruction technique. CONTRAST:  75mL OMNIPAQUE IOHEXOL 350 MG/ML SOLN COMPARISON:  None Available. FINDINGS: CT HEAD FINDINGS Brain: Known acute infarcts better characterized on recent MRI. No progressive  mass effect or acute hemorrhage. No midline shift. No hydrocephalus. Skull: Known metastases better seen on recent MRI. Sinuses/orbits: No acute abnormality. CTA NECK FINDINGS Aortic arch: Great vessel origins are patent without significant stenosis. Right carotid system: Atherosclerosis at the carotid bifurcation without greater than 50% stenosis. Left carotid system: Atherosclerosis at the carotid bifurcation without greater than 50% stenosis. Vertebral arteries: Right-dominant. No evidence of dissection, stenosis (50% or greater), or occlusion. Skeleton: No acute abnormality on limited assessment. Other neck: No acute abnormality on limited assessment. Upper chest: Partially imaged left upper lobe consolidation. Partially imaged layering left greater than right pleural effusions. Review of the MIP images confirms the above findings CTA HEAD FINDINGS Anterior circulation: Bilateral intracranial ICAs, MCAs, and ACAs are patent without proximal hemodynamically significant stenosis. Posterior circulation: Bilateral intradural vertebral arteries, basilar artery and bilateral posterior cerebral arteries are patent without proximal hemodynamically significant stenosis. Venous sinuses: As permitted by contrast timing, patent. Review of the MIP images confirms the above  findings IMPRESSION: 1. No large vessel occlusion or proximal hemodynamically significant stenosis. 2. Partially imaged left upper lobe consolidation, compatible with pneumonia that may be post-obstructive given known mass seen on prior PET-CT. A CT of the chest could further characterize if clinically warranted. 3. Partially imaged layering left greater than right pleural effusions. Electronically Signed   By: Feliberto Harts M.D.   On: 01/02/2024 19:15   ECHOCARDIOGRAM COMPLETE Result Date: 01/02/2024    ECHOCARDIOGRAM REPORT   Patient Name:   KIEREN ADKISON [place] Date of Exam: 01/02/2024 Medical Rec #:  [id number]   Height:       68.0 in Accession #:     [id number]  Weight:       153.6 lb Date of Birth:  Jul 11, 1951    BSA:          1.827 m Patient Age:    73 years    BP:           94/70 mmHg Patient Gender: M           HR:           80 bpm. Exam Location:  [facility] Procedure: 2D Echo, Cardiac Doppler and Color Doppler Indications:     Stroke I63.9  History:         Patient has no prior history of Echocardiogram examinations.                  Stroke.  Sonographer:     Lucendia Herrlich RCS Referring Phys:  [id number] Charise Killian Diagnosing Phys: Yvonne Kendall MD IMPRESSIONS  1. Left ventricular ejection fraction, by estimation, is 55 to 60%. The left ventricle has normal function. The left ventricle has no regional wall motion abnormalities. Left ventricular diastolic parameters were normal.  2. Right ventricular systolic function is normal. The right ventricular size is normal. There is normal pulmonary artery systolic pressure.  3. Right atrial size was mildly dilated.  4. The mitral valve is normal in structure. Moderate mitral valve regurgitation. No evidence of mitral stenosis.  5. Tricuspid valve regurgitation is mild to moderate.  6. The aortic valve has an indeterminant number of cusps. Aortic valve regurgitation is trivial. No aortic stenosis is present.  7. Aortic dilatation noted. There is borderline dilatation of the aortic root, measuring 38 mm. There is mild dilatation of the ascending aorta, measuring 38 mm.  8. The inferior vena cava is normal in size with greater than 50% respiratory variability, suggesting right atrial pressure of 3 mmHg. FINDINGS  Left Ventricle: Left ventricular ejection fraction, by estimation, is 55 to 60%. The left ventricle has normal function. The left ventricle has no regional wall motion abnormalities. The left ventricular internal cavity size was normal in size. There is  no left ventricular hypertrophy. Left ventricular diastolic parameters were normal. Right Ventricle: The right ventricular size is normal. No increase in  right ventricular wall thickness. Right ventricular systolic function is normal. There is normal pulmonary artery systolic pressure. The tricuspid regurgitant velocity is 2.78 m/s, and  with an assumed right atrial pressure of 3 mmHg, the estimated right ventricular systolic pressure is 33.9 mmHg. Left Atrium: Left atrial size was normal in size. Right Atrium: Right atrial size was mildly dilated. Pericardium: Trivial pericardial effusion is present. Mitral Valve: The mitral valve is normal in structure. Mild mitral annular calcification. Moderate mitral valve regurgitation. No evidence of mitral valve stenosis. Tricuspid Valve: The tricuspid valve is normal in structure. Tricuspid valve regurgitation is mild  to moderate. Aortic Valve: The aortic valve has an indeterminant number of cusps. Aortic valve regurgitation is trivial. No aortic stenosis is present. Aortic valve peak gradient measures 11.0 mmHg. Pulmonic Valve: The pulmonic valve was not well visualized. Pulmonic valve regurgitation is mild. No evidence of pulmonic stenosis. Aorta: Aortic dilatation noted. There is borderline dilatation of the aortic root, measuring 38 mm. There is mild dilatation of the ascending aorta, measuring 38 mm. Pulmonary Artery: The pulmonary artery is not well seen. Venous: The inferior vena cava is normal in size with greater than 50% respiratory variability, suggesting right atrial pressure of 3 mmHg. IAS/Shunts: The interatrial septum was not well visualized.  LEFT VENTRICLE PLAX 2D LVIDd:         5.20 cm   Diastology LVIDs:         3.70 cm   LV e' medial:    9.14 cm/s LV PW:         0.90 cm   LV E/e' medial:  11.4 LV IVS:        0.90 cm   LV e' lateral:   12.80 cm/s LVOT diam:     2.20 cm   LV E/e' lateral: 8.1 LV SV:         78 LV SV Index:   42 LVOT Area:     3.80 cm  RIGHT VENTRICLE             IVC RV S prime:     23.20 cm/s  IVC diam: 1.50 cm TAPSE (M-mode): 3.1 cm LEFT ATRIUM             Index        RIGHT ATRIUM            Index LA diam:        3.50 cm 1.92 cm/m   RA Area:     21.40 cm LA Vol (A2C):   47.8 ml 26.17 ml/m  RA Volume:   73.90 ml  40.45 ml/m LA Vol (A4C):   31.0 ml 16.94 ml/m LA Biplane Vol: 36.6 ml 20.03 ml/m  AORTIC VALVE AV Area (Vmax): 2.66 cm AV Vmax:        166.00 cm/s AV Peak Grad:   11.0 mmHg LVOT Vmax:      116.00 cm/s LVOT Vmean:     76.600 cm/s LVOT VTI:       0.204 m  AORTA Ao Root diam: 3.80 cm Ao Asc diam:  3.80 cm MITRAL VALVE                TRICUSPID VALVE MV Area (PHT): 3.48 cm     TR Peak grad:   30.9 mmHg MV Decel Time: 218 msec     TR Vmax:        278.00 cm/s MR Peak grad: 78.8 mmHg MR Vmax:      443.75 cm/s   SHUNTS MV E velocity: 104.00 cm/s  Systemic VTI:  0.20 m MV A velocity: 88.60 cm/s   Systemic Diam: 2.20 cm MV E/A ratio:  1.17 Yvonne Kendall MD Electronically signed by Yvonne Kendall MD Signature Date/Time: 01/02/2024/6:29:56 PM    Final    MR Brain W Wo Contrast Result Date: 12/30/2023 CLINICAL DATA:  Non-small cell lung cancer, staging. EXAM: MRI HEAD WITHOUT AND WITH CONTRAST TECHNIQUE: Multiplanar, multiecho pulse sequences of the brain and surrounding structures were obtained without and with intravenous contrast. CONTRAST:  7mL GADAVIST GADOBUTROL 1 MMOL/ML IV SOLN COMPARISON:  CT head without  contrast 12/28/2023. 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 Additional punctate cortical infarcts are present just superior to the largest area within the pre frontal cortex. Three separate punctate white matter scratched at 3 separate punctate acute white matter infarcts are present in the right corona radiata. A punctate area of acute infarction is present in the left lentiform nucleus. A punctate acute cortical infarct is present the left occipital pole. A 5 mm acute nonhemorrhagic infarct is present in the inferior right cerebellum. A punctate infarct is present inferiorly in the left cerebellum. Vascular: Flow  is present in the major intracranial arteries. Skull and upper cervical spine: Craniocervical junction is normal. 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 No pathologic fractures are present. Sinuses/Orbits: The paranasal sinuses and mastoid air cells are clear. The globes and orbits are within normal limits. IMPRESSION: 1. 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 2. Additional small infarcts involving the more superior left frontal lobe, right corona radiata, left basal ganglia and bilateral cerebellum. This suggests a central source. 3. 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 No pathologic fractures are present. Electronically Signed   By: Marin Roberts M.D.   On: 12/30/2023 19:42   CT HEAD W & WO CONTRAST ( ) Result Date: 12/28/2023 CLINICAL DATA:  Brain metastases suspected. Newly diagnosed lung cancer. Headaches. EXAM: CT HEAD WITHOUT AND WITH CONTRAST TECHNIQUE: Contiguous axial images were obtained from the base of the skull through the vertex without and with intravenous contrast. RADIATION DOSE REDUCTION: This exam was performed according to the departmental dose-optimization program which includes automated exposure control, adjustment of the mA and/or kV according to patient size and/or use of iterative reconstruction technique. CONTRAST:  75mL OMNIPAQUE IOHEXOL 300 MG/ML  SOLN COMPARISON:  None Available. FINDINGS: Brain: There is no evidence of an acute infarct, intracranial hemorrhage, mass, midline shift, or extra-axial fluid collection. Cerebral volume is within normal limits for age. The ventricles are normal in size. Cerebral white matter hypodensities are nonspecific but compatible with mild chronic small vessel ischemic disease. No abnormal enhancement is identified. Vascular: The dural venous sinuses and large  intracranial arteries are grossly patent. Skull: No fracture.  Scattered small skull lesions. Sinuses/Orbits: Visualized paranasal sinuses and mastoid air cells are clear. Unremarkable orbits. Other: None. IMPRESSION: 1. No evidence of intracranial metastases. 2. Scattered small skull lesions, suspicious for metastases given evidence of widespread osseous metastases on last month's PET-CT. 3. Mild chronic small vessel ischemic disease. Electronically Signed   By: Sebastian Ache M.D.   On: 12/28/2023 15:13   US Venous Img Lower Unilateral Right Result Date: 12/28/2023 CLINICAL DATA:  RLE edema, calf pain, lung cancer EXAM: RIGHT LOWER EXTREMITY VENOUS DOPPLER ULTRASOUND TECHNIQUE: Gray-scale sonography with compression, as well as color and duplex ultrasound, were performed to evaluate the deep venous system(s) from the level of the common femoral vein through the popliteal and proximal calf veins. COMPARISON:  PET-CT, 12/08/2023 FINDINGS: VENOUS Normal compressibility of the common femoral, superficial femoral, as well as the visualized calf veins. Visualized portions of profunda femoral vein and great saphenous vein unremarkable. Heterogeneously-echogenic, occlusive filling defects within and noncompressibility of the imaged portions of the RIGHT popliteal as well as the imaged calf veins. Limited views of the contralateral common femoral vein are unremarkable. OTHER No evidence of superficial thrombophlebitis or abnormal fluid  collection. Limitations: none IMPRESSION: Acute RIGHT lower extremity DVT within the popliteal and calf veins. Roanna Banning, MD [facility] Electronically Signed   By: Roanna Banning M.D.   On: 12/28/2023 12:12   DG Chest Port 1 View Result Date: 12/21/2023 CLINICAL DATA:  Status post bronchoscopy. EXAM: PORTABLE CHEST 1 VIEW COMPARISON:  September 10, 2018.  December 08, 2023. FINDINGS: Normal cardiac size. Left perihilar and upper  lobe opacity is noted concerning for atelectasis or infiltrate and underlying neoplasm. No pneumothorax is noted status post bronchoscopy. Right lungs clear. IMPRESSION: Left perihilar and upper lobe opacities noted concerning for atelectasis or infiltrate and underlying neoplasm as noted on prior CT scan. No pneumothorax status post bronchoscopy. Electronically Signed   By: Lupita Raider M.D.   On: 12/21/2023 10:25   DG C-ARM BRONCHOSCOPY Result Date: 12/21/2023 C-ARM BRONCHOSCOPY: Fluoroscopy was utilized by the requesting physician.  No radiographic interpretation.   NM PET Image Initial (PI) Skull Base To Thigh (F-18 FDG) Result Date: 12/16/2023 CLINICAL DATA:  Initial treatment strategy for left upper lobe pulmonary mass. EXAM: NUCLEAR MEDICINE PET SKULL BASE TO THIGH TECHNIQUE: 8.61 mCi F-18 FDG was injected intravenously. Full-ring PET imaging was performed from the skull base to thigh after the radiotracer. CT data was obtained and used for attenuation correction and anatomic localization. Fasting blood glucose: 97 mg/dl COMPARISON:  Chest CT, same date. FINDINGS: Mediastinal blood pool activity: SUV max 2.13 Liver activity: SUV max NA NECK: No hypermetabolic lymph nodes in the neck. Incidental CT findings: Scattered calcifications in the tonsillar regions consistent with prior inflammation or infection. CHEST: The left upper lobe pulmonary lesion adjacent to the major fissure is hypermetabolic with SUV max of 6.0 and consistent with primary lung neoplasm. There is hypermetabolic mediastinal lymphadenopathy. Right paratracheal node has an SUV max of 4.87. Left hilar disease which could be direct extension of tumor or adenopathy has an SUV max of 6.66. This surrounds the left upper lobe bronchus and partially occludes it. Subcarinal node has an SUV max of 5.36. Left pleural effusion but no hypermetabolic pleural lesions. Incidental CT findings: Stable vascular calcifications. Small left pleural  effusion. ABDOMEN/PELVIS: No abnormal hypermetabolic activity within the liver, pancreas, adrenal glands, or spleen. No hypermetabolic lymph nodes in the abdomen or pelvis. Incidental CT findings: Moderate atherosclerotic calcifications involving the aorta and branch vessels but no aneurysm. Scattered colonic diverticulosis. SKELETON: Diffuse scattered hypermetabolic bone lesions consistent with widespread metastatic bone disease. Sternal lesion has an SUV max of 3.42 Right sixth rib lesion has an SUV max of 4.59 T7 lesion has an SUV max of 5.26 Right iliac bone lesion has an SUV max of 5.89. Left hip/bus or trochanteric lesion has an SUV max of 4.39. Incidental CT findings: None. IMPRESSION: 1. Hypermetabolic left upper lobe pulmonary mass invading the left hilum consistent with primary lung neoplasm. 2. Hypermetabolic mediastinal and left hilar lymphadenopathy consistent with metastatic disease. 3. Diffuse scattered hypermetabolic bone lesions consistent with widespread metastatic bone disease. 4. No findings for metastatic disease involving the abdomen/pelvis. 5. Small left pleural effusion but no hypermetabolic pleural lesions. 6. Aortic atherosclerosis. Aortic Atherosclerosis (ICD10-I70.0). Electronically Signed   By: Rudie Meyer M.D.   On: 12/16/2023 17:36   CT SUPER D CHEST WO CONTRAST Result Date: 12/16/2023 CLINICAL DATA:  Left upper lobe pulmonary nodule. EXAM: CT CHEST WITHOUT CONTRAST TECHNIQUE: Multidetector CT imaging of the chest was performed using thin slice collimation for electromagnetic bronchoscopy planning purposes, without  intravenous contrast. RADIATION DOSE REDUCTION: This exam was performed according to the departmental dose-optimization program which includes automated exposure control, adjustment of the mA and/or kV according to patient size and/or use of iterative reconstruction technique. COMPARISON:  11/07/2023 FINDINGS: Cardiovascular: The heart size is normal. No substantial  pericardial effusion. Ascending thoracic aorta measures 4.1 cm diameter. Mild atherosclerotic calcification is noted in the wall of the thoracic aorta. Mediastinum/Nodes: Scattered normal and upper normal sized lymph nodes are seen in the mediastinum including 9 mm short axis right paratracheal node on 67/2. Calcified nodal tissue is seen in the left hilum with abnormal soft tissue density in the left suprahilar region encasing the left upper lobe bronchus. The esophagus has normal imaging features.There is no axillary lymphadenopathy. Lungs/Pleura: Centrilobular emphsyema noted. 3 mm right middle lobe nodule on image 130/4 is new in the interval. 5 mm subpleural right upper lobe nodule on 73/4 is stable. 4 mm right upper lobe nodule on 61/4 is new. 2 mm right upper lobe nodule on 41/4 has decreased from 4 mm previously. 5 mm subpleural right lower lobe nodule on 91/4 is stable. Soft tissue fullness in the right hilum and suprahilar region is progressive in the interval, encasing the bronchi to the apical in lingular segments of the left upper lobe. 6.7 x 3.5 cm masslike consolidative opacity in the posterior left upper lobe is new in the interval. This is associated with anteromedial left upper lobe volume loss adjacent to the mediastinum. Calcified granulomata noted left base. Small left pleural effusion is new in the interval. Upper Abdomen: No acute findings. 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 Musculoskeletal: No worrisome lytic or sclerotic osseous abnormality. IMPRESSION: 1. Progressive soft tissue fullness in the left hilum and suprahilar region, encasing the bronchi to the apical and lingular segments of the left upper lobe. This soft tissue is confluent with a 6.7 x 3.5 cm masslike consolidative opacity in the posterior left upper lobe, new in the interval. Imaging features raise concern for neoplasm with volume loss in the posterior and anteromedial left upper  lobe. 2. New small left pleural effusion. 3. Waxing and waning of small pulmonary nodules in the right lung. Continued close attention on follow-up recommended. 4. Aortic Atherosclerosis (ICD10-I70.0) and Emphysema (ICD10-J43.9). Electronically Signed   By: Kennith Center M.D.   On: 12/16/2023 14:03    PERFORMANCE STATUS (ECOG) : 1 - Symptomatic but completely ambulatory  Review of Systems Unless otherwise noted, a complete review of systems is negative.  Physical Exam General: NAD Pulmonary: Unlabored Abdomen: soft, nontender, + bowel sounds GU: no suprapubic tenderness Extremities: trace RLE edema, positive Homan's sign, no joint deformities Skin: no rashes Neurological: nonfocal  IMPRESSION: Follow-up visit.  Patient recently hospitalized with CVA.  Asymptomatic other than generalized weakness.    Today, patient reports his appetite is poor.  Endorses back pain but states that he is having some improvement with Norco.  However, he is constipated.  Patient also endorses a cough at bedtime while he is trying to sleep.  He asked for cough suppressant.  Discussed bowel regimen in detail and recommended use of MiraLAX/Senokot.  Will liberalize Norco and start Tussionex at bedtime.  Patient states that he does not want to return to the hospital and may opt for comfort measures instead.  He is not interested in resuscitation nor would he want his life prolonged artificially on machines.  We did complete a MOST form today detailing these decisions.  Patient  has decided that he is willing to trial cancer treatment.  He has been started on Tagrisso.   I completed a MOST form today. The patient and family outlined their wishes for the following treatment decisions:  Cardiopulmonary Resuscitation: Do Not Attempt Resuscitation (DNR/No CPR)  Medical Interventions: Comfort Measures: Keep clean, warm, and dry. Use medication by any route, positioning, wound care, and other measures to relieve  pain and suffering. Use oxygen, suction and manual treatment of airway obstruction as needed for comfort. Do not transfer to the hospital unless comfort needs cannot be met in current location.  Antibiotics: Determine use of limitation of antibiotics when infection occurs  IV Fluids: IV fluids for a defined trial period  Feeding Tube: No feeding tube     PLAN: -Patient still considering decision making -Liberalize Norco 5-325mg  every 4 hours as needed -Bowel regimen with MiraLAX/Senokot -Tussionex nightly as needed -Referrals to nutrition, social work, [employer] and rehab screening -MOST form completed -DNR/DNI -Follow-up telephone visit 3 to 4 weeks   Patient expressed understanding and was in agreement with this plan. He also understands that He can call the clinic at any time with any questions, concerns, or complaints.     Time Total: 25 minutes  Visit consisted of counseling and education dealing with the complex and emotionally intense issues of symptom management and palliative care in the setting of serious and potentially life-threatening illness.Greater than 50%  of this time was spent counseling and coordinating care related to the above assessment and plan.  Signed by: Laurette Schimke, PhD, NP-C

## 2024-01-05 NOTE — Assessment & Plan Note (Signed)
He may use norco PRN for pain. Bowel regimen PRN If pain gets worse, consider RT and narcotics for symptom palliation

## 2024-01-05 NOTE — Assessment & Plan Note (Signed)
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  TPS 2, CPS 20 NGS showed EGFR [id number] [19 inframe del ]  Recommend Osimertinib +/- chemotherapy.  Rationale and side effects were reviewed with patient.  Patient would like to start with Osimertinib first and if tolerates, may consider add chemotherapy.  Follow up with palliative care service.

## 2024-01-05 NOTE — Progress Notes (Signed)
Met with patient during follow up visit with Dr. Cathie Hoops. All questions answered during visit. Reviewed upcoming appts. Pt given resources regarding diagnosis and supportive services available. Contact info given and instructed to call with any questions or needs. Pt verbalized understanding.

## 2024-01-05 NOTE — Telephone Encounter (Signed)
Clinical Pharmacist Practitioner Encounter   Received new prescription for Tagrisso (osimertinib) for the treatment of stage IV non-small cell carcinoma of the lung, EGFR exon 19 deletion, planned duration until disease progression or unacceptable drug toxicity.  CMP from 12/1123 assessed, no relevant lab abnormalities. Prescription dose and frequency assessed.   Current medication list in Epic reviewed, no relevant DDIs with osimertinib identified.  Evaluated chart and no patient barriers to medication adherence identified.   Prescription has been e-scribed to the [facility] for benefits analysis and approval.  [facility] will continue to follow for insurance authorization, copayment issues, initial counseling and start date.  Remi Haggard, PharmD, BCOP, CPP Hematology/Oncology Clinical Pharmacist [facility] [phone]  01/05/2024 11:38 AM

## 2024-01-05 NOTE — Assessment & Plan Note (Signed)
Follow up with neurology. Need outpatient cardiac monitor

## 2024-01-05 NOTE — Progress Notes (Signed)
 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  TPS 2, CPS 20 NGS showed EGFR [id number] [19 inframe del ]  Recommend Osimertinib +/- chemotherapy.  Rationale and side effects were reviewed with patient.  Patient would like to start with Osimertinib first and if tolerates, may consider add chemotherapy.  Follow up with palliative care service.   Leukocytosis Predominantly neutrophilia. Likely reactive due to cancer, smoking   Metastatic cancer to bone [facility]) He may use norco PRN for pain. Bowel regimen PRN If pain gets worse, consider RT and narcotics for symptom palliation  Acute deep vein thrombosis (DVT) of right lower extremity (HCC) Continue Xarelto starter package Refer to social worker - no prescription coverage  CVA (cerebral vascular accident) (HCC) Follow up with neurology. Need outpatient cardiac monitor   Orders Placed This Encounter  Procedures   Ambulatory Referral to [facility]    Referral Priority:   Routine    Referral Type:   Consultation    Referral Reason:   Specialty Services Required    Number of Visits Requested:   1   Ambulatory referral to Social Work    Referral Priority:   Routine    Referral Type:   Consultation    Referral Reason:   Specialty Services Required    Number of Visits Requested:   1   Follow up in a few weeks.  All questions were answered. The patient knows to call the clinic with any problems, questions or concerns.  Rickard Patience, MD, PhD [facility] Hematology Oncology 01/05/2024    HISTORY OF PRESENTING ILLNESS:  Joe Morton 73 y.o. male presents to establish care for lung cancer I have reviewed his chart and materials related to his cancer extensively and collaborated history with the patient. Summary of oncologic history is as follows: Oncology History  Malignant neoplasm of upper lobe of left lung (HCC)  11/11/2023 Imaging   CT angio chest aorta w contrast  1. 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 Recommend annual imaging followup by CTA or MRA. This recommendation follows 2010 ACCF/AHA/AATS/ACR/ASA/SCA/SCAI/SIR/STS/SVM Guidelines for the Diagnosis and Management of Patients with Thoracic Aortic Disease. Circulation. 2010; 121: R678-L381. Aortic aneurysm NOS (ICD10-I71.9) 2. Stable tortuosity of the aortic arch. 3. Significant emphysematous lung disease with diffuse perihilar bronchial thickening bilaterally. 4. There is an area of spiculation at a convergence point of posterior left upper lobe bronchi over a region measuring roughly 1.9 x 1.3 x 2.0 cm. This is also associated with adjacent nodularity and thickening of the adjacent major fissure over several cm. This was not present in 2019. Although this may be secondary to inflammation/infection, malignancy is not excluded and further follow-up and workup may be indicated including short-term interval follow-up CT, possible PET scan and consideration of bronchoscopy targeted to this region. Referral to Pulmonology for follow-up is recommended. 5. Numerous small scattered mediastinal lymph nodes are present. The largest measures roughly 8 mm in short axis at the level  of the AP window. 6. Probable hepatic steatosis.    12/16/2023 Imaging   CT super D chest wo contrast  1. Progressive soft tissue fullness in the left hilum and suprahilar region, encasing the bronchi to the apical and lingular  segments of the left upper lobe. This soft tissue is confluent with a 6.7 x 3.5 cm masslike consolidative opacity in the posterior left upper lobe, new in the interval. Imaging features raise concern for neoplasm with volume loss in the posterior and anteromedial left upper lobe. 2. New small left pleural effusion. 3. Waxing and waning of small pulmonary nodules in the right lung. Continued close attention on follow-up recommended. 4. Aortic Atherosclerosis (ICD10-I70.0) and Emphysema (ICD10-J43.9   12/16/2023 Imaging   PET scan showed  1. Hypermetabolic left upper lobe pulmonary mass invading the left hilum consistent with primary lung neoplasm. 2. Hypermetabolic mediastinal and left hilar lymphadenopathy consistent with metastatic disease. 3. Diffuse scattered hypermetabolic bone lesions consistent with widespread metastatic bone disease. 4. No findings for metastatic disease involving the abdomen/pelvis. 5. Small left pleural effusion but no hypermetabolic pleural lesions. 6. Aortic atherosclerosis.   Aortic Atherosclerosis (ICD10-I70.0).   12/21/2023 Initial Diagnosis   Malignant neoplasm of upper lobe of left lung (HCC)  Biopsy via bronchoscopy showed  A. LUNG, LUL, FINE NEEDLE ASPIRATION  BIOPSY:  - Non-small cell carcinoma , TTF 1 positive, p40 and cytokeratin 5/6 negative, consistent with adenocarcinoma.   B. LYMPH NODE, STATION 11R INFERIOR, FINE NEEDLE ASPIRATION:  - Rare atypical cells suspicious for carcinoma   C. LYMPH NODE, STATION 7, FINE NEEDLE ASPIRATION:  - Non-small cell carcinoma   D. LUNG, LUL, LAVAGE:  FINAL MICROSCOPIC DIAGNOSIS:  - No malignant cells identified  - Benign bronchial cells and pulmonary macrophages   TPS 2, CPS 20 NGS showed EGFR [id number] [19 inframe del ]      12/25/2023 Cancer Staging   Staging form: Lung, AJCC V9 - Clinical: Stage IVB (cT3, cN2, cM1c1) - Signed by Rickard Patience, MD on 12/25/2023 Stage prefix: Initial diagnosis    12/28/2023 Imaging   [place] right lower extremity showed  Acute RIGHT lower extremity DVT within the popliteal and calf veins.     12/30/2023 Imaging   MRI brain w wo contrast  1. 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 2. Additional small infarcts involving the more superior left frontal lobe, right corona radiata, left basal ganglia and bilateral cerebellum. This suggests a central source. 3. 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 No pathologic fractures are present.   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 Etiology of stroke is favored to be paradoxical embolism in the setting of known DVT (just diagnosed, had not started eliquis before MRI showed acute infarcts), although cardioembolic source cannot be excluded. TTE showed no e/o intracardiac clot   Neurology recommend outpatient follow up with cardiology for cardiac monitor Patient was discharged on Xarelto      Today patient reports feeling ok. He takes Xarelto and tolerated well. Accompanied by wife.  Headache has improved. Right lower extremity calf pain has resolved.   MEDICAL HISTORY:  Past Medical History:  Diagnosis Date   Emphysema lung (HCC)    Hypertension     SURGICAL HISTORY: Past Surgical History:  Procedure Laterality Date   BRONCHIAL BIOPSY  12/21/2023   Procedure:  BRONCHIAL BIOPSIES;  Surgeon: [name], MD;  Location: [facility] ENDOSCOPY;  Service: Pulmonary;;   BRONCHIAL NEEDLE ASPIRATION BIOPSY  12/21/2023   Procedure: BRONCHIAL NEEDLE ASPIRATION BIOPSIES;  Surgeon: [name], MD;  Location: [facility] ENDOSCOPY;  Service: Pulmonary;;   BRONCHIAL WASHINGS  12/21/2023   Procedure: BRONCHIAL WASHINGS;  Surgeon: [name], MD;  Location: [facility] ENDOSCOPY;  Service:  Pulmonary;;   COLONOSCOPY     ENDOBRONCHIAL ULTRASOUND Bilateral 12/21/2023   Procedure: ENDOBRONCHIAL ULTRASOUND;  Surgeon: [name], MD;  Location: [facility] ENDOSCOPY;  Service: Pulmonary;  Laterality: Bilateral;   FINE NEEDLE ASPIRATION  12/21/2023   Procedure: FINE NEEDLE ASPIRATION (FNA) LINEAR;  Surgeon: [name], MD;  Location: [facility] ENDOSCOPY;  Service: Pulmonary;;   INGUINAL HERNIA REPAIR Left 09/17/2018   Procedure: OPEN LEFT INGUINAL HERNIA REPAIR WITH MESH;  Surgeon: Jimmye Norman, MD;  Location: [facility] OR;  Service: General;  Laterality: Left;  GENERAL AND LMA   INSERTION OF MESH Left 09/17/2018   Procedure: INSERTION OF MESH;  Surgeon: Jimmye Norman, MD;  Location: [facility] OR;  Service: General;  Laterality: Left;  GENERAL AND LMA    SOCIAL HISTORY: Social History   Socioeconomic History   Marital status: Married    Spouse name: Not on file   Number of children: Not on file   Years of education: Not on file   Highest education level: Not on file  Occupational History   Not on file  Tobacco Use   Smoking status: Every Day    Current packs/day: 1.00    Average packs/day: 1 pack/day for 44.1 years (44.1 ttl pk-yrs)    Types: Cigarettes    Start date: 4   Smokeless tobacco: Never   Tobacco comments:    Smoking 3 cigarettes a day x 2 weeks   Vaping Use   Vaping status: Never Used  Substance and Sexual Activity   Alcohol use: Yes    Alcohol/week: 6.0 standard drinks of alcohol    Types: 6 Standard drinks or equivalent per week    Comment: occasionally   Drug use: No   Sexual activity: Not on file  Other Topics Concern   Not on file  Social History Narrative   Not on file   Social Drivers of Health   Financial Resource Strain: Low Risk  (12/25/2023)   Overall Financial Resource Strain (CARDIA)    Difficulty of Paying Living Expenses: Not very hard  Food Insecurity: No Food Insecurity (01/01/2024)   Hunger Vital Sign    Worried About Running Out of Food in  the Last Year: Never true    Ran Out of Food in the Last Year: Never true  Transportation Needs: No Transportation Needs (01/01/2024)   PRAPARE - Administrator, Civil Service (Medical): No    Lack of Transportation (Non-Medical): No  Physical Activity: Not on file  Stress: No Stress Concern Present (12/25/2023)   [employer] of Occupational Health - Occupational Stress Questionnaire    Feeling of Stress : Only a little  Social Connections: Moderately Integrated (01/01/2024)   Social Connection and Isolation Panel [NHANES]    Frequency of Communication with Friends and Family: Three times a week    Frequency of Social Gatherings with Friends and Family: Once a week    Attends Religious Services: 1 to 4 times per year    Active Member of [employer] or Organizations: No    Attends Banker Meetings: Never    Marital Status: Married  Catering manager Violence:  Not At Risk (01/01/2024)   Humiliation, Afraid, Rape, and Kick questionnaire    Fear of Current or Ex-Partner: No    Emotionally Abused: No    Physically Abused: No    Sexually Abused: No    FAMILY HISTORY: History reviewed. No pertinent family history.  ALLERGIES:  has no known allergies.  MEDICATIONS:  Current Outpatient Medications  Medication Sig Dispense Refill   Acetaminophen 500 MG capsule Take 500 mg by mouth every 6 (six) hours as needed for mild pain (pain score 1-3) or moderate pain (pain score 4-6).     megestrol (MEGACE) 40 MG tablet Take 2 tablets (80 mg total) by mouth 2 (two) times daily. 120 tablet 0   mirtazapine (REMERON) 7.5 MG tablet Take 1 tablet (7.5 mg total) by mouth at bedtime. 30 tablet 2   ondansetron (ZOFRAN) 8 MG tablet Take 1 tablet (8 mg total) by mouth every 8 (eight) hours as needed for nausea or vomiting. 20 tablet 0   rivaroxaban (XARELTO) 20 MG TABS tablet Take 1 tablet (20 mg total) by mouth daily. For three weeks after taking 15 mg twice a day for three weeks 21 tablet  1   RIVAROXABAN (XARELTO) VTE STARTER PACK (15 & 20 MG) Take 15mg  by mouth 2 (two) times daily for 21 days, then take 20mg  by mouth daily for 9 days. 51 each 0   chlorpheniramine-HYDROcodone (TUSSIONEX) 10-8 MG/5ML Take 5 mLs by mouth at bedtime as needed for cough. 70 mL 0   HYDROcodone-acetaminophen (NORCO) 5-325 MG tablet Take 1 tablet by mouth every 4 (four) hours as needed for moderate pain (pain score 4-6).     osimertinib mesylate (TAGRISSO) 80 MG tablet Take 80 mg by mouth daily.     No current facility-administered medications for this visit.    Review of Systems  Constitutional:  Positive for fatigue. Negative for appetite change, chills, fever and unexpected weight change.  HENT:   Negative for hearing loss and voice change.   Eyes:  Negative for eye problems and icterus.  Respiratory:  Positive for cough. Negative for chest tightness, hemoptysis and shortness of breath.   Cardiovascular:  Negative for chest pain and leg swelling.  Gastrointestinal:  Negative for abdominal distention and abdominal pain.  Endocrine: Negative for hot flashes.  Genitourinary:  Negative for difficulty urinating, dysuria and frequency.   Musculoskeletal:  Negative for arthralgias.  Skin:  Negative for itching and rash.  Neurological:  Negative for light-headedness and numbness.  Hematological:  Negative for adenopathy. Does not bruise/bleed easily.  Psychiatric/Behavioral:  Negative for confusion.      PHYSICAL EXAMINATION: ECOG PERFORMANCE STATUS: 1 - Symptomatic but completely ambulatory  Vitals:   01/05/24 1016  BP: 101/89  Pulse: 97  Resp: 18  Temp: (!) 96.6 F (35.9 C)  SpO2: 96%   Filed Weights   01/05/24 1016  Weight: 148 lb 4.8 oz (67.3 kg)    Physical Exam Constitutional:      General: He is not in acute distress.    Appearance: He is not diaphoretic.  HENT:     Head: Normocephalic and atraumatic.  Eyes:     General: No scleral icterus.    Pupils: Pupils are equal,  round, and reactive to light.  Cardiovascular:     Rate and Rhythm: Normal rate and regular rhythm.     Heart sounds: No murmur heard. Pulmonary:     Effort: Pulmonary effort is normal. No respiratory distress.     Breath sounds:  Normal breath sounds. No wheezing or rhonchi.  Abdominal:     General: Bowel sounds are normal. There is no distension.     Palpations: Abdomen is soft.     Tenderness: There is no abdominal tenderness.  Musculoskeletal:        General: Normal range of motion.     Cervical back: Normal range of motion and neck supple.  Skin:    General: Skin is warm and dry.     Findings: No erythema.  Neurological:     Mental Status: He is alert and oriented to person, place, and time. Mental status is at baseline.     Motor: No abnormal muscle tone.  Psychiatric:        Mood and Affect: Mood and affect normal.      LABORATORY DATA:  I have reviewed the data as listed    Latest Ref Rng & Units 01/03/2024    6:06 AM 01/02/2024    5:23 AM 01/01/2024   10:29 AM  CBC  WBC 4.0 - 10.5 K/uL 12.1  11.9  12.6   Hemoglobin 13.0 - 17.0 g/dL 13.0  86.5  78.4   Hematocrit 39.0 - 52.0 % 48.6  47.6  50.3   Platelets 150 - 400 K/uL 146  147  155       Latest Ref Rng & Units 01/02/2024    5:23 AM 01/01/2024   10:29 AM 12/25/2023   10:32 AM  CMP  Glucose 70 - 99 mg/dL 89  696  295   BUN 8 - 23 mg/dL 18  17  11    Creatinine 0.61 - 1.24 mg/dL 2.84  1.32  4.40   Sodium 135 - 145 mmol/L 140  139  130   Potassium 3.5 - 5.1 mmol/L 4.5  4.1  4.4   Chloride 98 - 111 mmol/L 99  100  96   CO2 22 - 32 mmol/L 28  27  26    Calcium 8.9 - 10.3 mg/dL 10.2  72.5  9.5   Total Protein 6.5 - 8.1 g/dL 6.7  7.2  7.3   Total Bilirubin 0.0 - 1.2 mg/dL 0.3  0.9  0.6   Alkaline Phos 38 - 126 U/L 722  769  609   AST 15 - 41 U/L 43  44  26   ALT 0 - 44 U/L 32  31  21      RADIOGRAPHIC STUDIES: I have personally reviewed the radiological images as listed and agreed with the findings in the  report. CT ANGIO HEAD NECK W WO CM Result Date: 01/02/2024 CLINICAL DATA:  Stroke/TIA, determine embolic source EXAM: CT ANGIOGRAPHY HEAD AND NECK WITH AND WITHOUT CONTRAST TECHNIQUE: Multidetector CT imaging of the head and neck was performed using the standard protocol during bolus administration of intravenous contrast. Multiplanar CT image reconstructions and MIPs were obtained to evaluate the vascular anatomy. Carotid stenosis measurements (when applicable) are obtained utilizing NASCET criteria, using the distal internal carotid diameter as the denominator. RADIATION DOSE REDUCTION: This exam was performed according to the departmental dose-optimization program which includes automated exposure control, adjustment of the mA and/or kV according to patient size and/or use of iterative reconstruction technique. CONTRAST:  75mL OMNIPAQUE IOHEXOL 350 MG/ML SOLN COMPARISON:  None Available. FINDINGS: CT HEAD FINDINGS Brain: Known acute infarcts better characterized on recent MRI. No progressive mass effect or acute hemorrhage. No midline shift. No hydrocephalus. Skull: Known metastases better seen on recent MRI. Sinuses/orbits: No acute abnormality. CTA NECK FINDINGS  Aortic arch: Great vessel origins are patent without significant stenosis. Right carotid system: Atherosclerosis at the carotid bifurcation without greater than 50% stenosis. Left carotid system: Atherosclerosis at the carotid bifurcation without greater than 50% stenosis. Vertebral arteries: Right-dominant. No evidence of dissection, stenosis (50% or greater), or occlusion. Skeleton: No acute abnormality on limited assessment. Other neck: No acute abnormality on limited assessment. Upper chest: Partially imaged left upper lobe consolidation. Partially imaged layering left greater than right pleural effusions. Review of the MIP images confirms the above findings CTA HEAD FINDINGS Anterior circulation: Bilateral intracranial ICAs, MCAs, and ACAs are  patent without proximal hemodynamically significant stenosis. Posterior circulation: Bilateral intradural vertebral arteries, basilar artery and bilateral posterior cerebral arteries are patent without proximal hemodynamically significant stenosis. Venous sinuses: As permitted by contrast timing, patent. Review of the MIP images confirms the above findings IMPRESSION: 1. No large vessel occlusion or proximal hemodynamically significant stenosis. 2. Partially imaged left upper lobe consolidation, compatible with pneumonia that may be post-obstructive given known mass seen on prior PET-CT. A CT of the chest could further characterize if clinically warranted. 3. Partially imaged layering left greater than right pleural effusions. Electronically Signed   By: Feliberto Harts M.D.   On: 01/02/2024 19:15   ECHOCARDIOGRAM COMPLETE Result Date: 01/02/2024    ECHOCARDIOGRAM REPORT   Patient Name:   Joe Morton [facility] Date of Exam: 01/02/2024 Medical Rec #:  [id number]   Height:       68.0 in Accession #:    [id number]  Weight:       153.6 lb Date of Birth:  1951/09/14    BSA:          1.827 m Patient Age:    73 years    BP:           94/70 mmHg Patient Gender: M           HR:           80 bpm. Exam Location:  [facility] Procedure: 2D Echo, Cardiac Doppler and Color Doppler Indications:     Stroke I63.9  History:         Patient has no prior history of Echocardiogram examinations.                  Stroke.  Sonographer:     Lucendia Herrlich RCS Referring Phys:  [id number] Charise Killian Diagnosing Phys: Yvonne Kendall MD IMPRESSIONS  1. Left ventricular ejection fraction, by estimation, is 55 to 60%. The left ventricle has normal function. The left ventricle has no regional wall motion abnormalities. Left ventricular diastolic parameters were normal.  2. Right ventricular systolic function is normal. The right ventricular size is normal. There is normal pulmonary artery systolic pressure.  3. Right atrial size was mildly dilated.  4.  The mitral valve is normal in structure. Moderate mitral valve regurgitation. No evidence of mitral stenosis.  5. Tricuspid valve regurgitation is mild to moderate.  6. The aortic valve has an indeterminant number of cusps. Aortic valve regurgitation is trivial. No aortic stenosis is present.  7. Aortic dilatation noted. There is borderline dilatation of the aortic root, measuring 38 mm. There is mild dilatation of the ascending aorta, measuring 38 mm.  8. The inferior vena cava is normal in size with greater than 50% respiratory variability, suggesting right atrial pressure of 3 mmHg. FINDINGS  Left Ventricle: Left ventricular ejection fraction, by estimation, is 55 to 60%. The left ventricle has normal function. The left ventricle  has no regional wall motion abnormalities. The left ventricular internal cavity size was normal in size. There is  no left ventricular hypertrophy. Left ventricular diastolic parameters were normal. Right Ventricle: The right ventricular size is normal. No increase in right ventricular wall thickness. Right ventricular systolic function is normal. There is normal pulmonary artery systolic pressure. The tricuspid regurgitant velocity is 2.78 m/s, and  with an assumed right atrial pressure of 3 mmHg, the estimated right ventricular systolic pressure is 33.9 mmHg. Left Atrium: Left atrial size was normal in size. Right Atrium: Right atrial size was mildly dilated. Pericardium: Trivial pericardial effusion is present. Mitral Valve: The mitral valve is normal in structure. Mild mitral annular calcification. Moderate mitral valve regurgitation. No evidence of mitral valve stenosis. Tricuspid Valve: The tricuspid valve is normal in structure. Tricuspid valve regurgitation is mild to moderate. Aortic Valve: The aortic valve has an indeterminant number of cusps. Aortic valve regurgitation is trivial. No aortic stenosis is present. Aortic valve peak gradient measures 11.0 mmHg. Pulmonic Valve: The  pulmonic valve was not well visualized. Pulmonic valve regurgitation is mild. No evidence of pulmonic stenosis. Aorta: Aortic dilatation noted. There is borderline dilatation of the aortic root, measuring 38 mm. There is mild dilatation of the ascending aorta, measuring 38 mm. Pulmonary Artery: The pulmonary artery is not well seen. Venous: The inferior vena cava is normal in size with greater than 50% respiratory variability, suggesting right atrial pressure of 3 mmHg. IAS/Shunts: The interatrial septum was not well visualized.  LEFT VENTRICLE PLAX 2D LVIDd:         5.20 cm   Diastology LVIDs:         3.70 cm   LV e' medial:    9.14 cm/s LV PW:         0.90 cm   LV E/e' medial:  11.4 LV IVS:        0.90 cm   LV e' lateral:   12.80 cm/s LVOT diam:     2.20 cm   LV E/e' lateral: 8.1 LV SV:         78 LV SV Index:   42 LVOT Area:     3.80 cm  RIGHT VENTRICLE             IVC RV S prime:     23.20 cm/s  IVC diam: 1.50 cm TAPSE (M-mode): 3.1 cm LEFT ATRIUM             Index        RIGHT ATRIUM           Index LA diam:        3.50 cm 1.92 cm/m   RA Area:     21.40 cm LA Vol (A2C):   47.8 ml 26.17 ml/m  RA Volume:   73.90 ml  40.45 ml/m LA Vol (A4C):   31.0 ml 16.94 ml/m LA Biplane Vol: 36.6 ml 20.03 ml/m  AORTIC VALVE AV Area (Vmax): 2.66 cm AV Vmax:        166.00 cm/s AV Peak Grad:   11.0 mmHg LVOT Vmax:      116.00 cm/s LVOT Vmean:     76.600 cm/s LVOT VTI:       0.204 m  AORTA Ao Root diam: 3.80 cm Ao Asc diam:  3.80 cm MITRAL VALVE                TRICUSPID VALVE MV Area (PHT): 3.48 cm     TR Peak  grad:   30.9 mmHg MV Decel Time: 218 msec     TR Vmax:        278.00 cm/s MR Peak grad: 78.8 mmHg MR Vmax:      443.75 cm/s   SHUNTS MV E velocity: 104.00 cm/s  Systemic VTI:  0.20 m MV A velocity: 88.60 cm/s   Systemic Diam: 2.20 cm MV E/A ratio:  1.17 Yvonne Kendall MD Electronically signed by Yvonne Kendall MD Signature Date/Time: 01/02/2024/6:29:56 PM    Final    MR Brain W Wo Contrast Result Date:  12/30/2023 CLINICAL DATA:  Non-small cell lung cancer, staging. EXAM: MRI HEAD WITHOUT AND WITH CONTRAST TECHNIQUE: Multiplanar, multiecho pulse sequences of the brain and surrounding structures were obtained without and with intravenous contrast. CONTRAST:  7mL GADAVIST GADOBUTROL 1 MMOL/ML IV SOLN COMPARISON:  CT head without contrast 12/28/2023. 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 Additional punctate cortical infarcts are present just superior to the largest area within the pre frontal cortex. Three separate punctate white matter scratched at 3 separate punctate acute white matter infarcts are present in the right corona radiata. A punctate area of acute infarction is present in the left lentiform nucleus. A punctate acute cortical infarct is present the left occipital pole. A 5 mm acute nonhemorrhagic infarct is present in the inferior right cerebellum. A punctate infarct is present inferiorly in the left cerebellum. Vascular: Flow is present in the major intracranial arteries. Skull and upper cervical spine: Craniocervical junction is normal. 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 No pathologic fractures are present. Sinuses/Orbits: The paranasal sinuses and mastoid air cells are clear. The globes and orbits are within normal limits. IMPRESSION: 1. 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 2. Additional small infarcts involving the more superior left frontal lobe, right corona radiata, left basal ganglia and bilateral cerebellum. This suggests a central source. 3. 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 No pathologic fractures are present. Electronically Signed   By: Marin Roberts M.D.   On: 12/30/2023 19:42   CT HEAD W & WO CONTRAST ( ) Result Date:  12/28/2023 CLINICAL DATA:  Brain metastases suspected. Newly diagnosed lung cancer. Headaches. EXAM: CT HEAD WITHOUT AND WITH CONTRAST TECHNIQUE: Contiguous axial images were obtained from the base of the skull through the vertex without and with intravenous contrast. RADIATION DOSE REDUCTION: This exam was performed according to the departmental dose-optimization program which includes automated exposure control, adjustment of the mA and/or kV according to patient size and/or use of iterative reconstruction technique. CONTRAST:  75mL OMNIPAQUE IOHEXOL 300 MG/ML  SOLN COMPARISON:  None Available. FINDINGS: Brain: There is no evidence of an acute infarct, intracranial hemorrhage, mass, midline shift, or extra-axial fluid collection. Cerebral volume is within normal limits for age. The ventricles are normal in size. Cerebral white matter hypodensities are nonspecific but compatible with mild chronic small vessel ischemic disease. No abnormal enhancement is identified. Vascular: The dural venous sinuses and large intracranial arteries are grossly patent. Skull: No fracture.  Scattered small skull lesions. Sinuses/Orbits: Visualized paranasal sinuses and mastoid air cells are clear. Unremarkable orbits. Other: None. IMPRESSION: 1. No evidence of intracranial metastases. 2. Scattered small skull lesions, suspicious for metastases given evidence of widespread osseous metastases on last month's PET-CT. 3. Mild chronic small vessel ischemic disease. Electronically Signed   By:  Sebastian Ache M.D.   On: 12/28/2023 15:13   US Venous Img Lower Unilateral Right Result Date: 12/28/2023 CLINICAL DATA:  RLE edema, calf pain, lung cancer EXAM: RIGHT LOWER EXTREMITY VENOUS DOPPLER ULTRASOUND TECHNIQUE: Gray-scale sonography with compression, as well as color and duplex ultrasound, were performed to evaluate the deep venous system(s) from the level of the common femoral vein through the popliteal and proximal calf veins. COMPARISON:   PET-CT, 12/08/2023 FINDINGS: VENOUS Normal compressibility of the common femoral, superficial femoral, as well as the visualized calf veins. Visualized portions of profunda femoral vein and great saphenous vein unremarkable. Heterogeneously-echogenic, occlusive filling defects within and noncompressibility of the imaged portions of the RIGHT popliteal as well as the imaged calf veins. Limited views of the contralateral common femoral vein are unremarkable. OTHER No evidence of superficial thrombophlebitis or abnormal fluid collection. Limitations: none IMPRESSION: Acute RIGHT lower extremity DVT within the popliteal and calf veins. Roanna Banning, MD [facility] Electronically Signed   By: Roanna Banning M.D.   On: 12/28/2023 12:12   DG Chest Port 1 View Result Date: 12/21/2023 CLINICAL DATA:  Status post bronchoscopy. EXAM: PORTABLE CHEST 1 VIEW COMPARISON:  September 10, 2018.  December 08, 2023. FINDINGS: Normal cardiac size. Left perihilar and upper lobe opacity is noted concerning for atelectasis or infiltrate and underlying neoplasm. No pneumothorax is noted status post bronchoscopy. Right lungs clear. IMPRESSION: Left perihilar and upper lobe opacities noted concerning for atelectasis or infiltrate and underlying neoplasm as noted on prior CT scan. No pneumothorax status post bronchoscopy. Electronically Signed   By: Lupita Raider M.D.   On: 12/21/2023 10:25   DG C-ARM BRONCHOSCOPY Result Date: 12/21/2023 C-ARM BRONCHOSCOPY: Fluoroscopy was utilized by the requesting physician.  No radiographic interpretation.   NM PET Image Initial (PI) Skull Base To Thigh (F-18 FDG) Result Date: 12/16/2023 CLINICAL DATA:  Initial treatment strategy for left upper lobe pulmonary mass. EXAM: NUCLEAR MEDICINE PET SKULL BASE TO THIGH TECHNIQUE: 8.61 mCi F-18 FDG was injected intravenously. Full-ring PET imaging was performed from the skull base to thigh after the  radiotracer. CT data was obtained and used for attenuation correction and anatomic localization. Fasting blood glucose: 97 mg/dl COMPARISON:  Chest CT, same date. FINDINGS: Mediastinal blood pool activity: SUV max 2.13 Liver activity: SUV max NA NECK: No hypermetabolic lymph nodes in the neck. Incidental CT findings: Scattered calcifications in the tonsillar regions consistent with prior inflammation or infection. CHEST: The left upper lobe pulmonary lesion adjacent to the major fissure is hypermetabolic with SUV max of 6.0 and consistent with primary lung neoplasm. There is hypermetabolic mediastinal lymphadenopathy. Right paratracheal node has an SUV max of 4.87. Left hilar disease which could be direct extension of tumor or adenopathy has an SUV max of 6.66. This surrounds the left upper lobe bronchus and partially occludes it. Subcarinal node has an SUV max of 5.36. Left pleural effusion but no hypermetabolic pleural lesions. Incidental CT findings: Stable vascular calcifications. Small left pleural effusion. ABDOMEN/PELVIS: No abnormal hypermetabolic activity within the liver, pancreas, adrenal glands, or spleen. No hypermetabolic lymph nodes in the abdomen or pelvis. Incidental CT findings: Moderate atherosclerotic calcifications involving the aorta and branch vessels but no aneurysm. Scattered colonic diverticulosis. SKELETON: Diffuse scattered hypermetabolic bone lesions consistent with widespread metastatic bone disease. Sternal lesion has an SUV max of 3.42 Right sixth rib lesion has an SUV max of 4.59 T7 lesion has an SUV max of 5.26 Right iliac bone  lesion has an SUV max of 5.89. Left hip/bus or trochanteric lesion has an SUV max of 4.39. Incidental CT findings: None. IMPRESSION: 1. Hypermetabolic left upper lobe pulmonary mass invading the left hilum consistent with primary lung neoplasm. 2. Hypermetabolic mediastinal and left hilar lymphadenopathy consistent with metastatic disease. 3. Diffuse  scattered hypermetabolic bone lesions consistent with widespread metastatic bone disease. 4. No findings for metastatic disease involving the abdomen/pelvis. 5. Small left pleural effusion but no hypermetabolic pleural lesions. 6. Aortic atherosclerosis. Aortic Atherosclerosis (ICD10-I70.0). Electronically Signed   By: Rudie Meyer M.D.   On: 12/16/2023 17:36   CT SUPER D CHEST WO CONTRAST Result Date: 12/16/2023 CLINICAL DATA:  Left upper lobe pulmonary nodule. EXAM: CT CHEST WITHOUT CONTRAST TECHNIQUE: Multidetector CT imaging of the chest was performed using thin slice collimation for electromagnetic bronchoscopy planning purposes, without intravenous contrast. RADIATION DOSE REDUCTION: This exam was performed according to the departmental dose-optimization program which includes automated exposure control, adjustment of the mA and/or kV according to patient size and/or use of iterative reconstruction technique. COMPARISON:  11/07/2023 FINDINGS: Cardiovascular: The heart size is normal. No substantial pericardial effusion. Ascending thoracic aorta measures 4.1 cm diameter. Mild atherosclerotic calcification is noted in the wall of the thoracic aorta. Mediastinum/Nodes: Scattered normal and upper normal sized lymph nodes are seen in the mediastinum including 9 mm short axis right paratracheal node on 67/2. Calcified nodal tissue is seen in the left hilum with abnormal soft tissue density in the left suprahilar region encasing the left upper lobe bronchus. The esophagus has normal imaging features.There is no axillary lymphadenopathy. Lungs/Pleura: Centrilobular emphsyema noted. 3 mm right middle lobe nodule on image 130/4 is new in the interval. 5 mm subpleural right upper lobe nodule on 73/4 is stable. 4 mm right upper lobe nodule on 61/4 is new. 2 mm right upper lobe nodule on 41/4 has decreased from 4 mm previously. 5 mm subpleural right lower lobe nodule on 91/4 is stable. Soft tissue fullness in the right  hilum and suprahilar region is progressive in the interval, encasing the bronchi to the apical in lingular segments of the left upper lobe. 6.7 x 3.5 cm masslike consolidative opacity in the posterior left upper lobe is new in the interval. This is associated with anteromedial left upper lobe volume loss adjacent to the mediastinum. Calcified granulomata noted left base. Small left pleural effusion is new in the interval. Upper Abdomen: No acute findings. 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 Musculoskeletal: No worrisome lytic or sclerotic osseous abnormality. IMPRESSION: 1. Progressive soft tissue fullness in the left hilum and suprahilar region, encasing the bronchi to the apical and lingular segments of the left upper lobe. This soft tissue is confluent with a 6.7 x 3.5 cm masslike consolidative opacity in the posterior left upper lobe, new in the interval. Imaging features raise concern for neoplasm with volume loss in the posterior and anteromedial left upper lobe. 2. New small left pleural effusion. 3. Waxing and waning of small pulmonary nodules in the right lung. Continued close attention on follow-up recommended. 4. Aortic Atherosclerosis (ICD10-I70.0) and Emphysema (ICD10-J43.9). Electronically Signed   By: Kennith Center M.D.   On: 12/16/2023 14:03

## 2024-01-08 ENCOUNTER — Inpatient Hospital Stay: Payer: Medicare Other

## 2024-01-08 ENCOUNTER — Telehealth: Payer: Self-pay | Admitting: *Deleted

## 2024-01-08 ENCOUNTER — Telehealth: Payer: Self-pay

## 2024-01-08 ENCOUNTER — Inpatient Hospital Stay: Payer: Medicare Other | Admitting: Pharmacist

## 2024-01-08 ENCOUNTER — Emergency Department
Admission: EM | Admit: 2024-01-08 | Discharge: 2024-01-08 | Discharge status: Against Medical Advice | Payer: Medicare Other | Attending: Student in an Organized Health Care Education/Training Program | Admitting: Student in an Organized Health Care Education/Training Program

## 2024-01-08 DIAGNOSIS — R531 Weakness: Secondary | ICD-10-CM | POA: Diagnosis not present

## 2024-01-08 DIAGNOSIS — C3412 Malignant neoplasm of upper lobe, left bronchus or lung: Secondary | ICD-10-CM | POA: Diagnosis not present

## 2024-01-08 DIAGNOSIS — Z8673 Personal history of transient ischemic attack (TIA), and cerebral infarction without residual deficits: Secondary | ICD-10-CM | POA: Insufficient documentation

## 2024-01-08 DIAGNOSIS — R251 Tremor, unspecified: Secondary | ICD-10-CM | POA: Insufficient documentation

## 2024-01-08 DIAGNOSIS — R630 Anorexia: Secondary | ICD-10-CM | POA: Insufficient documentation

## 2024-01-08 DIAGNOSIS — Z5321 Procedure and treatment not carried out due to patient leaving prior to being seen by health care provider: Secondary | ICD-10-CM | POA: Diagnosis not present

## 2024-01-08 LAB — URINALYSIS, ROUTINE W REFLEX MICROSCOPIC
Bacteria, UA: NONE SEEN
Bilirubin Urine: NEGATIVE
Glucose, UA: NEGATIVE mg/dL
Hgb urine dipstick: NEGATIVE
Ketones, ur: 5 mg/dL — AB
Leukocytes,Ua: NEGATIVE
Nitrite: NEGATIVE
Protein, ur: 30 mg/dL — AB
Specific Gravity, Urine: 1.023 (ref 1.005–1.030)
pH: 5 (ref 5.0–8.0)

## 2024-01-08 LAB — BASIC METABOLIC PANEL
Anion gap: 14 (ref 5–15)
BUN: 20 mg/dL (ref 8–23)
CO2: 22 mmol/L (ref 22–32)
Calcium: 11 mg/dL — ABNORMAL HIGH (ref 8.9–10.3)
Chloride: 105 mmol/L (ref 98–111)
Creatinine, Ser: 1.26 mg/dL — ABNORMAL HIGH (ref 0.61–1.24)
GFR, Estimated: >60 mL/min (ref >60)
Glucose, Bld: 108 mg/dL — ABNORMAL HIGH (ref 70–99)
Potassium: 4.1 mmol/L (ref 3.5–5.1)
Sodium: 141 mmol/L (ref 135–145)

## 2024-01-08 LAB — CBC
HCT: 49 % (ref 39.0–52.0)
Hemoglobin: 16.7 g/dL (ref 13.0–17.0)
MCH: 31.7 pg (ref 26.0–34.0)
MCHC: 34.1 g/dL (ref 30.0–36.0)
MCV: 93 fL (ref 80.0–100.0)
Platelets: 123 10*3/uL — ABNORMAL LOW (ref 150–400)
RBC: 5.27 MIL/uL (ref 4.22–5.81)
RDW: 13 % (ref 11.5–15.5)
WBC: 14.3 10*3/uL — ABNORMAL HIGH (ref 4.0–10.5)
nRBC: 0 % (ref 0.0–0.2)

## 2024-01-08 NOTE — ED Provider Triage Note (Signed)
 Emergency Medicine Provider Triage Evaluation Note  Joe Morton , a 73 y.o. male  was evaluated in triage.  Pt complains of generalized weakness, loss of appetite, and shakiness since discharge from here 5 days ago after CVA without residual weakness.  Physical Exam  There were no vitals taken for this visit. Gen:   Awake, no distress   Resp:  Normal effort  MSK:   Moves extremities without difficulty  Other:    Medical Decision Making  Medically screening exam initiated at 3:48 PM.  Appropriate orders placed.  Joe Morton was informed that the remainder of the evaluation will be completed by another provider, this initial triage assessment does not replace that evaluation, and the importance of remaining in the ED until their evaluation is complete.  Labs and urinalysis ordered.   Chinita Pester, FNP 01/08/24 1551

## 2024-01-08 NOTE — Progress Notes (Signed)
 [facility] CSW Progress Note  Clinical Child psychotherapist contacted caregiver by phone to assess needs.  Per referrals from Dr. Cathie Hoops and Elouise Munroe, CSW contacted patient and spoke with his wife, Joe Morton.  She said she was getting ready to take patient to the ER based on her conversation with the nurse navigator.  Encouraged her to contact staff with any concerns or questions.    Dorothey Baseman, LCSW Clinical Social Worker [facility]

## 2024-01-08 NOTE — Telephone Encounter (Signed)
 Per Joe Morton, Joe Morton will be starting osimertinib today.   Please schedule lab/MD/ Alyson  in 2 weeks and inform pt of appt.

## 2024-01-08 NOTE — ED Triage Notes (Signed)
 Pt c/o increasing weakness, lack of appetite, and constipation since being discharged from the hospital.  Denies abdominal pain.  Pt was recently diagnosed w/ a stroke.    Pt has not started lung CA treatment.

## 2024-01-08 NOTE — Telephone Encounter (Signed)
 Oral Oncology Patient Advocate Encounter   Submitted application for assistance for [place] to [employer].   Application submitted via e-fax to [phone]   [employer] phone number [phone].   I will continue to check the status until final determination.   Patty Almedia Balls, CPhT Oncology Pharmacy Patient Advocate [facility] Direct Number: [phone] Fax: [phone]

## 2024-01-08 NOTE — Telephone Encounter (Signed)
 pt's wife concerned about recent change in pt's behavior. She feels that he has declined over the weekend. Reports he has been lethargic, sleeping more frequently, and his hands have been trembling. Per Dr. Cathie Hoops, due to recent embolic stroke on prior imaging and recently starting blood thinners, pt needs to seek further evaluation in the ER for mental status changes. Pt's wife made aware and verbalized understanding.

## 2024-01-08 NOTE — Progress Notes (Signed)
 Clinical Pharmacist Practitioner Clinic [facility]  Telephone:([phone] [phone]  Patient Care Team: Emilio Aspen, MD as PCP - General (Internal Medicine) Rickard Patience, MD as Consulting Physician (Oncology) Glory Buff, RN as Oncology Nurse Navigator   Name of the patient: Joe Morton  [id number]  05-15-1951   Date of visit: 01/08/24  HPI: Patient is a 73 y.o. 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 He plans to start osimertinib 01/08/24.    Reason for Consult: Tagrisso oral chemotherapy education.   PAST MEDICAL HISTORY: Past Medical History:  Diagnosis Date   Emphysema lung (HCC)    Hypertension     HEMATOLOGY/ONCOLOGY HISTORY:  Oncology History  Malignant neoplasm of upper lobe of left lung (HCC)  11/11/2023 Imaging   CT angio chest aorta w contrast  1. 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 Recommend annual imaging followup by CTA or MRA. This recommendation follows 2010 ACCF/AHA/AATS/ACR/ASA/SCA/SCAI/SIR/STS/SVM Guidelines for the Diagnosis and Management of Patients with Thoracic Aortic Disease. Circulation. 2010; 121: Y865-H846. Aortic aneurysm NOS (ICD10-I71.9) 2. Stable tortuosity of the aortic arch. 3. Significant emphysematous lung disease with diffuse perihilar bronchial thickening bilaterally. 4. There is an area of spiculation at a convergence point of posterior left upper lobe bronchi over a region measuring roughly 1.9 x 1.3 x 2.0 cm. This is also associated with adjacent nodularity and thickening of the adjacent major fissure over several cm. This was not present in 2019. Although this may be secondary to inflammation/infection, malignancy is not excluded and further follow-up and workup may be indicated including short-term interval follow-up CT, possible PET scan and consideration of bronchoscopy targeted to this region. Referral to  Pulmonology for follow-up is recommended. 5. Numerous small scattered mediastinal lymph nodes are present. The largest measures roughly 8 mm in short axis at the level of the AP window. 6. Probable hepatic steatosis.    12/16/2023 Imaging   CT super D chest wo contrast  1. Progressive soft tissue fullness in the left hilum and suprahilar region, encasing the bronchi to the apical and lingular segments of the left upper lobe. This soft tissue is confluent with a 6.7 x 3.5 cm masslike consolidative opacity in the posterior left upper lobe, new in the interval. Imaging features raise concern for neoplasm with volume loss in the posterior and anteromedial left upper lobe. 2. New small left pleural effusion. 3. Waxing and waning of small pulmonary nodules in the right lung. Continued close attention on follow-up recommended. 4. Aortic Atherosclerosis (ICD10-I70.0) and Emphysema (ICD10-J43.9   12/16/2023 Imaging   PET scan showed  1. Hypermetabolic left upper lobe pulmonary mass invading the left hilum consistent with primary lung neoplasm. 2. Hypermetabolic mediastinal and left hilar lymphadenopathy consistent with metastatic disease. 3. Diffuse scattered hypermetabolic bone lesions consistent with widespread metastatic bone disease. 4. No findings for metastatic disease involving the abdomen/pelvis. 5. Small left pleural effusion but no hypermetabolic pleural lesions. 6. Aortic atherosclerosis.   Aortic Atherosclerosis (ICD10-I70.0).   12/21/2023 Initial Diagnosis   Malignant neoplasm of upper lobe of left lung (HCC)  Biopsy via bronchoscopy showed  A. LUNG, LUL, FINE NEEDLE ASPIRATION  BIOPSY:  - Non-small cell carcinoma , TTF 1 positive, p40 and cytokeratin 5/6 negative, consistent with adenocarcinoma.   B. LYMPH NODE, STATION 11R INFERIOR, FINE NEEDLE ASPIRATION:  - Rare atypical cells suspicious for carcinoma   C. LYMPH NODE, STATION 7, FINE NEEDLE ASPIRATION:  -  Non-small  cell carcinoma   D. LUNG, LUL, LAVAGE:  FINAL MICROSCOPIC DIAGNOSIS:  - No malignant cells identified  - Benign bronchial cells and pulmonary macrophages   TPS 2, CPS 20 NGS showed EGFR [id number] [19 inframe del ]      12/25/2023 Cancer Staging   Staging form: Lung, AJCC V9 - Clinical: Stage IVB (cT3, cN2, cM1c1) - Signed by Rickard Patience, MD on 12/25/2023 Stage prefix: Initial diagnosis   12/28/2023 Imaging   [place] right lower extremity showed  Acute RIGHT lower extremity DVT within the popliteal and calf veins.     12/30/2023 Imaging   MRI brain w wo contrast  1. 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 2. Additional small infarcts involving the more superior left frontal lobe, right corona radiata, left basal ganglia and bilateral cerebellum. This suggests a central source. 3. 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 No pathologic fractures are present.   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 Etiology of stroke is favored to be paradoxical embolism in the setting of known DVT (just diagnosed, had not started eliquis before MRI showed acute infarcts), although cardioembolic source cannot be excluded. TTE showed no e/o intracardiac clot   Neurology recommend outpatient follow up with cardiology for cardiac monitor Patient was discharged on Xarelto      ALLERGIES:  has no known allergies.  MEDICATIONS:  Current Outpatient Medications  Medication Sig Dispense Refill   Acetaminophen 500 MG capsule Take 500 mg by mouth every 6 (six) hours as needed for mild pain (pain score 1-3) or moderate pain (pain score 4-6).     chlorpheniramine-HYDROcodone (TUSSIONEX) 10-8 MG/5ML Take 5 mLs by mouth at bedtime as needed for cough. 70 mL 0   HYDROcodone-acetaminophen  (NORCO) 5-325 MG tablet Take 1 tablet by mouth every 4 (four) hours as needed for moderate pain (pain score 4-6).     megestrol (MEGACE) 40 MG tablet Take 2 tablets (80 mg total) by mouth 2 (two) times daily. 120 tablet 0   mirtazapine (REMERON) 7.5 MG tablet Take 1 tablet (7.5 mg total) by mouth at bedtime. 30 tablet 2   ondansetron (ZOFRAN) 8 MG tablet Take 1 tablet (8 mg total) by mouth every 8 (eight) hours as needed for nausea or vomiting. 20 tablet 0   osimertinib mesylate (TAGRISSO) 80 MG tablet Take 80 mg by mouth daily.     rivaroxaban (XARELTO) 20 MG TABS tablet Take 1 tablet (20 mg total) by mouth daily. For three weeks after taking 15 mg twice a day for three weeks 21 tablet 1   RIVAROXABAN (XARELTO) VTE STARTER PACK (15 & 20 MG) Take 15mg  by mouth 2 (two) times daily for 21 days, then take 20mg  by mouth daily for 9 days. 51 each 0   No current facility-administered medications for this visit.    VITAL SIGNS: There were no vitals taken for this visit. There were no vitals filed for this visit.  Estimated body mass index is 22.55 kg/m as calculated from the following:   Height as of 01/01/24: 5\' 8"  (1.727 m).   Weight as of 01/05/24: 67.3 kg (148 lb 4.8 oz).  LABS: CBC:    Component Value Date/Time   WBC 12.1 (H) 01/03/2024 0606   HGB 16.5 01/03/2024 0606   HCT 48.6 01/03/2024 0606  PLT 146 (L) 01/03/2024 0606   MCV 93.1 01/03/2024 0606   NEUTROABS 9.2 (H) 01/01/2024 1029   LYMPHSABS 1.2 01/01/2024 1029   MONOABS 1.4 (H) 01/01/2024 1029   EOSABS 0.4 01/01/2024 1029   BASOSABS 0.1 01/01/2024 1029   Comprehensive Metabolic Panel:    Component Value Date/Time   NA 140 01/02/2024 0523   K 4.5 01/02/2024 0523   CL 99 01/02/2024 0523   CO2 28 01/02/2024 0523   BUN 18 01/02/2024 0523   CREATININE 1.08 01/02/2024 0523   GLUCOSE 89 01/02/2024 0523   CALCIUM 10.1 01/02/2024 0523   AST 43 (H) 01/02/2024 0523   ALT 32 01/02/2024 0523   ALKPHOS 722 (H) 01/02/2024 0523    BILITOT 0.3 01/02/2024 0523   PROT 6.7 01/02/2024 0523   ALBUMIN 3.2 (L) 01/02/2024 0523     Present during today's visit: Patient's spouse, Joe Morton   Start plan: Patient will start osimertinib 01/08/24    Patient Education I spoke with Mrs. Joe Morton for overview of new oral chemotherapy medication: osimertinib  Administration: Counseled Mrs. Joe Morton on administration, dosing, side effects, monitoring, drug-food interactions, safe handling, storage, and disposal. Patient will take 1 tablet (80 mg) by mouth once daily.  Side Effects: Side effects include but not limited to: myelosuppression, diarrhea, stomatitis, skin toxicities.    Drug-drug Interactions (DDI): No noted drug interactions noted for osimertinib on patient's medication list.   Adherence: After discussion with Mrs Joe Morton, no patient barriers to medication adherence identified.  Reviewed the importance of keeping a medication schedule and plan for any missed doses.  She voiced understanding and appreciation. All questions answered. Medication handout provided.  Provided her with Oral Chemotherapy Navigation Clinic phone number. She knows to call the office with questions or concerns. Oral Chemotherapy Navigation Clinic will continue to follow.  She expressed understanding and was in agreement with this plan. She also understands that the patient can call clinic at any time with any questions, concerns, or complaints.   Medication Access Issues: Application form for patient assistance program completed at today's visit. Mrs Joe Morton was provided with a 30 day medication sample for patient to start while patient assistance is being obtained.   Follow-up plan: RTC as scheduled   Thank you for allowing me to participate in the care of this patient.   Time Total: 40 minutes   Visit consisted of counseling and education on dealing with issues of symptom management in the setting of serious and potentially life-threatening  illness.Greater than 50% of this time was spent counseling and coordinating care related to the above assessment and plan.  Signed by: Jerry Caras, PharmD PGY2 Oncology Pharmacy Resident   01/08/2024 12:12 PM

## 2024-01-09 ENCOUNTER — Emergency Department: Payer: Medicare Other

## 2024-01-09 ENCOUNTER — Emergency Department
Admission: EM | Admit: 2024-01-09 | Discharge: 2024-01-10 | Disposition: A | Discharge status: Home / Self Care | Payer: Medicare Other | Attending: Student in an Organized Health Care Education/Training Program | Admitting: Student in an Organized Health Care Education/Training Program

## 2024-01-09 ENCOUNTER — Other Ambulatory Visit: Payer: Self-pay | Admitting: Hospice and Palliative Medicine

## 2024-01-09 ENCOUNTER — Encounter: Payer: Self-pay | Admitting: *Deleted

## 2024-01-09 ENCOUNTER — Inpatient Hospital Stay: Payer: Medicare Other

## 2024-01-09 ENCOUNTER — Other Ambulatory Visit: Payer: Self-pay

## 2024-01-09 DIAGNOSIS — S2231XA Fracture of one rib, right side, initial encounter for closed fracture: Secondary | ICD-10-CM | POA: Insufficient documentation

## 2024-01-09 DIAGNOSIS — S8001XA Contusion of right knee, initial encounter: Secondary | ICD-10-CM | POA: Diagnosis not present

## 2024-01-09 DIAGNOSIS — S3991XA Unspecified injury of abdomen, initial encounter: Secondary | ICD-10-CM | POA: Diagnosis not present

## 2024-01-09 DIAGNOSIS — Z85118 Personal history of other malignant neoplasm of bronchus and lung: Secondary | ICD-10-CM | POA: Diagnosis not present

## 2024-01-09 DIAGNOSIS — W19XXXA Unspecified fall, initial encounter: Secondary | ICD-10-CM | POA: Insufficient documentation

## 2024-01-09 DIAGNOSIS — S20211A Contusion of right front wall of thorax, initial encounter: Secondary | ICD-10-CM

## 2024-01-09 DIAGNOSIS — S2241XA Multiple fractures of ribs, right side, initial encounter for closed fracture: Secondary | ICD-10-CM | POA: Diagnosis not present

## 2024-01-09 DIAGNOSIS — C3412 Malignant neoplasm of upper lobe, left bronchus or lung: Secondary | ICD-10-CM | POA: Diagnosis not present

## 2024-01-09 DIAGNOSIS — J189 Pneumonia, unspecified organism: Secondary | ICD-10-CM

## 2024-01-09 DIAGNOSIS — S299XXA Unspecified injury of thorax, initial encounter: Secondary | ICD-10-CM | POA: Diagnosis present

## 2024-01-09 LAB — COMPREHENSIVE METABOLIC PANEL
ALT: 27 U/L (ref 0–44)
AST: 51 U/L — ABNORMAL HIGH (ref 15–41)
Albumin: 3.1 g/dL — ABNORMAL LOW (ref 3.5–5.0)
Alkaline Phosphatase: 1088 U/L — ABNORMAL HIGH (ref 38–126)
Anion gap: 14 (ref 5–15)
BUN: 26 mg/dL — ABNORMAL HIGH (ref 8–23)
CO2: 21 mmol/L — ABNORMAL LOW (ref 22–32)
Calcium: 11.1 mg/dL — ABNORMAL HIGH (ref 8.9–10.3)
Chloride: 103 mmol/L (ref 98–111)
Creatinine, Ser: 1.14 mg/dL (ref 0.61–1.24)
GFR, Estimated: >60 mL/min (ref >60)
Glucose, Bld: 156 mg/dL — ABNORMAL HIGH (ref 70–99)
Potassium: 3.7 mmol/L (ref 3.5–5.1)
Sodium: 138 mmol/L (ref 135–145)
Total Bilirubin: 0.9 mg/dL (ref 0.0–1.2)
Total Protein: 6.8 g/dL (ref 6.5–8.1)

## 2024-01-09 MED ORDER — LACTULOSE 10 GM/15ML PO SOLN: 10.0000 g | Freq: Two times a day (BID) | ORAL | 0 refills | Status: DC | PRN

## 2024-01-09 MED ORDER — IOHEXOL 300 MG/ML  SOLN
100.0000 mL | Freq: Once | INTRAMUSCULAR | Status: AC | PRN
  Administered 2024-01-09: 100 mL via INTRAVENOUS

## 2024-01-09 NOTE — ED Provider Notes (Signed)
 [facility] Provider Note    Event Date/Time   First MD Initiated Contact with Patient 01/09/24 2127     (approximate)   History   Rib Injury   HPI  Joe Morton is a 73 y.o. male who presents today with history of fall this morning.  Patient was seen at urgent care got chest x-ray and diagnosed right fifth rib fracture.  States pain relief increase with movement and cough.Patient was referred to ED due to saturations of 92%.  Patient has history of small cell carcinoma stage IV.  Patient states chronic cough is getting worse.    Physical Exam   Triage Vital Signs: ED Triage Vitals  Encounter Vitals Group     BP 01/09/24 2054 102/73     Systolic BP Percentile --      Diastolic BP Percentile --      Pulse Rate 01/09/24 2054 (!) 116     Resp 01/09/24 2054 20     Temp 01/09/24 2054 98 F (36.7 C)     Temp Source 01/09/24 2054 Oral     SpO2 01/09/24 2054 92 %     Weight 01/09/24 2057 148 lb (67.1 kg)     Height 01/09/24 2057 5\' 8"  (1.727 m)     Head Circumference --      Peak Flow --      Pain Score 01/09/24 2102 0     Pain Loc --      Pain Education --      Exclude from Growth Chart --     Most recent vital signs: Vitals:   01/09/24 2054  BP: 102/73  Pulse: (!) 116  Resp: 20  Temp: 98 F (36.7 C)  SpO2: 92%     Constitutional: Alert , mild distress Eyes: Conjunctivae are normal.  Head: Atraumatic. Nose: No congestion/rhinnorhea. Mouth/Throat: Mucous membranes are moist.   Neck: Painless ROM.  Cardiovascular:   Good peripheral circulation. Respiratory: Normal respiratory effort.  No retractions.  No wheezing Gastrointestinal: Soft and nontender.  Musculoskeletal:  no deformity.  Seven  right rib is  tender to palpation. Neurologic:  MAE spontaneously. No gross focal neurologic deficits are appreciated.  Skin:  Skin is warm, dry and intact. No rash noted. Psychiatric: Mood and affect are normal. Speech and behavior are normal.     ED Results / Procedures / Treatments   Labs (all labs ordered are listed, but only abnormal results are displayed) Labs Reviewed - No data to display   EKG See physician read    RADIOLOGY I independently reviewed and interpreted imaging and agree with radiologists findings.      PROCEDURES:  Critical Care performed:   .Reduction of fracture  Date/Time: 01/09/2024 10:13 PM  Performed by: Gladys Damme, PA-C Authorized by: Gladys Damme, PA-C       MEDICATIONS ORDERED IN ED: Medications - No data to display   IMPRESSION / MDM / ASSESSMENT AND PLAN / ED COURSE  I reviewed the triage vital signs and the nursing notes.  Differential diagnosis includes, but is not limited to, rib fracture, pneumonia, pulmonary edema  Patient's presentation is most consistent with acute complicated illness / injury requiring diagnostic workup.   Chest CT  report pending.  Transferred care to Dr. Larinda Buttery at 11:55 PM Clinical Course as of 01/09/24 2222  Tue Jan 09, 2024  2216 DG Ribs Unilateral W/Chest Right Right seventh rib fracture without complicating factors. [AE]    Clinical Course User Index [  AE] Gladys Damme, PA-C     FINAL CLINICAL IMPRESSION(S) / ED DIAGNOSES   Final diagnoses:  None     Rx / DC Orders   ED Discharge Orders     None        Note:  This document was prepared using Dragon voice recognition software and may include unintentional dictation errors.   Gladys Damme, PA-C 01/10/24 0001    Willy Eddy, MD 01/13/24 [phone]

## 2024-01-09 NOTE — ED Triage Notes (Signed)
 Pt to ED via POV c/o right rib pain affter falling this morning. Pt went to urgent care in [place] and had xray done. Pt sent here because O2 was 92%. Pt denies CP, SHOB, dizziness

## 2024-01-09 NOTE — ED Provider Notes (Signed)
-----------------------------------------   11:53 PM on 01/09/2024 -----------------------------------------  Blood pressure 111/85, pulse (!) 103, temperature 98 F (36.7 C), temperature source Oral, resp. rate 17, height 5\' 8"  (1.727 m), weight 67.1 kg, SpO2 93%.  Assuming care from PA Evans.  In short, Joe Morton is a 73 y.o. male with a chief complaint of Rib Injury .  Refer to the original H&P for additional details.  The current plan of care is to follow-up CT chest/abdomen/pelvis for fall with apparent rib fracture.  ----------------------------------------- 12:35 AM on 01/10/2024 ----------------------------------------- CT imaging does not show rib fracture that was seen on x-ray, does show interval increase in left upper lobe consolidation.  Patient reports worsening cough lately but has not had any fevers and denies shortness of breath, currently maintaining oxygen saturations at 91 to 92% on room air.  He was offered admission for this, but prefers to trial antibiotics as an outpatient, will give dose of Augmentin here in the ED.  He was counseled to follow-up with his cancer provider and to return to the ED for new or worsening symptoms.  Patient and family agree with plan.    Chesley Noon, MD 01/10/24 [phone]

## 2024-01-09 NOTE — Progress Notes (Signed)
 [facility] CSW Progress Note  Visual merchandiser met with caregiver to assess needs per the request of Elouise Munroe, NP.  Met with Victorino Dike, patient's wife.  Provided information regarding services available at home, including home care.  Discussed self-care and counseling.  Patient has a daughter and granddauther who live in [place].  The granddaughter is an Engineer, building services and has been very helpful.  The couple have been married for 52 years.  Victorino Dike expressed feelings of sadness regarding patient's decline.  CSW provided active listening and supportive counseling.    Dorothey Baseman, LCSW Clinical Social Worker [facility]

## 2024-01-09 NOTE — Progress Notes (Signed)
 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  Past medical history of smoker, HTN.  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  Patient started tagrisso yesterday.  Met with wife only.  Patient did not feel well enough to come today.  Noted in the ER yesterday after fall. Reports no appetite for the last 2 weeks and getting weaker.  Has started appetite stimulant a few days ago.  Wife reports constipation.  Does not think he has had a bowel movement since prior to hospital admission on 2/10.  Reports no nausea, vomiting, abdominal distention and patient is passing gas. Gave 1 dose of senna and miralax yesterday.  Wife reports yesterday ate small grilled chicken salad for dinner and 1/2 tomato soup.  Does not really like oral nutrition supplements.  Has not touched his favorite candy.      Nutrition Focused Physical Exam:   Unable to perform   Medications: remeron, megace, miralax, senna   Labs: reviewed   Anthropometrics:   Height: 68 inches Weight: 148 lb 4.8 oz on 2/14 161 lb 6.4 oz on 1/29 BMI: 22  8% weight loss in the last 2-3 weeks, significant   Estimated Energy Needs  Kcals: 1675-2000 Protein: 83-100 g Fluid: 1675-2000 ml   NUTRITION DIAGNOSIS: Inadequate oral intake related to cancer and related side effects as evidenced by 8% weight loss in the last 2-3 weeks and poor po intake for the last 2 weeks   MALNUTRITION DIAGNOSIS: Likely but unable to confirm without NFPE   INTERVENTION:  Secure chat with Palliative NP regarding titrating dose of miralax and senna for wife.  Instructions written for wife.  NP also called in prescription for lactulose.  Discussed with wife ER triggers if having nausea, vomiting, abdominal pain/distention/bloating to present to ER.   Discussed High Calorie, High Protein diet.  Handout provided Encouraged presenting small  amounts of food q 2 hours Sample of boost VHC shake given to wife for patient to try. Recipes for shakes/smoothies and high calorie foods provided to wife Wife wanting to speak with someone regarding resources in the home.  LCSW was able to speak with wife today.  Contact information provided    MONITORING, EVALUATION, GOAL: weight trends, intake   Next Visit: phone call, Wed March 5   Allesandra Huebsch B. Freida Busman, RD, LDN Registered Dietitian [phone]

## 2024-01-09 NOTE — Telephone Encounter (Signed)
 Oral Oncology Patient Advocate Encounter   Received notification that the application for assistance for Tagrisso through [employer] has been denied due to income.   [employer] phone number [phone].  Working on appealing that decision.  Patty Almedia Balls, CPhT Oncology Pharmacy Patient Advocate [facility] Direct Number: [phone] Fax: [phone]

## 2024-01-10 ENCOUNTER — Inpatient Hospital Stay: Payer: Medicare Other

## 2024-01-10 ENCOUNTER — Other Ambulatory Visit: Payer: Medicare Other

## 2024-01-10 ENCOUNTER — Encounter: Payer: Self-pay | Admitting: *Deleted

## 2024-01-10 DIAGNOSIS — E872 Acidosis, unspecified: Secondary | ICD-10-CM | POA: Diagnosis not present

## 2024-01-10 DIAGNOSIS — S2231XD Fracture of one rib, right side, subsequent encounter for fracture with routine healing: Secondary | ICD-10-CM | POA: Diagnosis not present

## 2024-01-10 DIAGNOSIS — J90 Pleural effusion, not elsewhere classified: Secondary | ICD-10-CM | POA: Diagnosis not present

## 2024-01-10 DIAGNOSIS — I6389 Other cerebral infarction: Secondary | ICD-10-CM | POA: Diagnosis not present

## 2024-01-10 DIAGNOSIS — R0602 Shortness of breath: Secondary | ICD-10-CM | POA: Diagnosis not present

## 2024-01-10 DIAGNOSIS — R54 Age-related physical debility: Secondary | ICD-10-CM | POA: Diagnosis not present

## 2024-01-10 DIAGNOSIS — Z85118 Personal history of other malignant neoplasm of bronchus and lung: Secondary | ICD-10-CM | POA: Diagnosis not present

## 2024-01-10 DIAGNOSIS — Z515 Encounter for palliative care: Secondary | ICD-10-CM | POA: Diagnosis not present

## 2024-01-10 DIAGNOSIS — Z7189 Other specified counseling: Secondary | ICD-10-CM | POA: Diagnosis not present

## 2024-01-10 DIAGNOSIS — J439 Emphysema, unspecified: Secondary | ICD-10-CM | POA: Diagnosis not present

## 2024-01-10 DIAGNOSIS — A419 Sepsis, unspecified organism: Secondary | ICD-10-CM | POA: Diagnosis not present

## 2024-01-10 DIAGNOSIS — C3412 Malignant neoplasm of upper lobe, left bronchus or lung: Secondary | ICD-10-CM | POA: Diagnosis not present

## 2024-01-10 DIAGNOSIS — C3490 Malignant neoplasm of unspecified part of unspecified bronchus or lung: Secondary | ICD-10-CM

## 2024-01-10 DIAGNOSIS — C3492 Malignant neoplasm of unspecified part of left bronchus or lung: Secondary | ICD-10-CM | POA: Diagnosis not present

## 2024-01-10 DIAGNOSIS — D72829 Elevated white blood cell count, unspecified: Secondary | ICD-10-CM | POA: Diagnosis not present

## 2024-01-10 DIAGNOSIS — R9389 Abnormal findings on diagnostic imaging of other specified body structures: Secondary | ICD-10-CM | POA: Diagnosis not present

## 2024-01-10 DIAGNOSIS — I10 Essential (primary) hypertension: Secondary | ICD-10-CM | POA: Diagnosis not present

## 2024-01-10 DIAGNOSIS — G4700 Insomnia, unspecified: Secondary | ICD-10-CM | POA: Diagnosis not present

## 2024-01-10 DIAGNOSIS — J910 Malignant pleural effusion: Secondary | ICD-10-CM | POA: Diagnosis not present

## 2024-01-10 DIAGNOSIS — S2231XA Fracture of one rib, right side, initial encounter for closed fracture: Secondary | ICD-10-CM | POA: Diagnosis not present

## 2024-01-10 DIAGNOSIS — Z7901 Long term (current) use of anticoagulants: Secondary | ICD-10-CM | POA: Diagnosis not present

## 2024-01-10 DIAGNOSIS — C7951 Secondary malignant neoplasm of bone: Secondary | ICD-10-CM | POA: Diagnosis not present

## 2024-01-10 DIAGNOSIS — E785 Hyperlipidemia, unspecified: Secondary | ICD-10-CM | POA: Diagnosis not present

## 2024-01-10 DIAGNOSIS — I2510 Atherosclerotic heart disease of native coronary artery without angina pectoris: Secondary | ICD-10-CM | POA: Diagnosis not present

## 2024-01-10 DIAGNOSIS — F17210 Nicotine dependence, cigarettes, uncomplicated: Secondary | ICD-10-CM | POA: Diagnosis not present

## 2024-01-10 DIAGNOSIS — J189 Pneumonia, unspecified organism: Secondary | ICD-10-CM | POA: Diagnosis not present

## 2024-01-10 DIAGNOSIS — R918 Other nonspecific abnormal finding of lung field: Secondary | ICD-10-CM | POA: Diagnosis not present

## 2024-01-10 DIAGNOSIS — I700 Atherosclerosis of aorta: Secondary | ICD-10-CM | POA: Diagnosis not present

## 2024-01-10 DIAGNOSIS — E860 Dehydration: Secondary | ICD-10-CM | POA: Diagnosis not present

## 2024-01-10 DIAGNOSIS — E440 Moderate protein-calorie malnutrition: Secondary | ICD-10-CM | POA: Diagnosis not present

## 2024-01-10 DIAGNOSIS — Z66 Do not resuscitate: Secondary | ICD-10-CM | POA: Diagnosis not present

## 2024-01-10 DIAGNOSIS — Z9889 Other specified postprocedural states: Secondary | ICD-10-CM | POA: Diagnosis not present

## 2024-01-10 DIAGNOSIS — J209 Acute bronchitis, unspecified: Secondary | ICD-10-CM | POA: Diagnosis not present

## 2024-01-10 LAB — CBC WITH DIFFERENTIAL/PLATELET
Abs Immature Granulocytes: 0.36 10*3/uL — ABNORMAL HIGH (ref 0.00–0.07)
Basophils Absolute: 0.1 10*3/uL (ref 0.0–0.1)
Basophils Relative: 1 %
Eosinophils Absolute: 0.1 10*3/uL (ref 0.0–0.5)
Eosinophils Relative: 1 %
HCT: 46.2 % (ref 39.0–52.0)
Hemoglobin: 15.8 g/dL (ref 13.0–17.0)
Immature Granulocytes: 3 %
Lymphocytes Relative: 7 %
Lymphs Abs: 0.8 10*3/uL (ref 0.7–4.0)
MCH: 31.5 pg (ref 26.0–34.0)
MCHC: 34.2 g/dL (ref 30.0–36.0)
MCV: 92.2 fL (ref 80.0–100.0)
Monocytes Absolute: 1.6 10*3/uL — ABNORMAL HIGH (ref 0.1–1.0)
Monocytes Relative: 12 %
Neutro Abs: 9.8 10*3/uL — ABNORMAL HIGH (ref 1.7–7.7)
Neutrophils Relative %: 76 %
Platelets: 98 10*3/uL — ABNORMAL LOW (ref 150–400)
RBC: 5.01 MIL/uL (ref 4.22–5.81)
RDW: 13.2 % (ref 11.5–15.5)
Smear Review: NORMAL
WBC: 12.7 10*3/uL — ABNORMAL HIGH (ref 4.0–10.5)
nRBC: 0 % (ref 0.0–0.2)

## 2024-01-10 MED ORDER — LIDOCAINE 5 % EX PTCH: 1.0000 | MEDICATED_PATCH | Freq: Two times a day (BID) | CUTANEOUS | 0 refills | Status: DC

## 2024-01-10 MED ORDER — AZITHROMYCIN 250 MG PO TABS: ORAL_TABLET | ORAL | 0 refills | Status: DC

## 2024-01-10 MED ORDER — AMOXICILLIN-POT CLAVULANATE 875-125 MG PO TABS: 1.0000 | ORAL_TABLET | Freq: Two times a day (BID) | ORAL | 0 refills | Status: DC

## 2024-01-10 MED ORDER — AMOXICILLIN-POT CLAVULANATE 875-125 MG PO TABS
1.0000 | ORAL_TABLET | Freq: Once | ORAL | Status: AC
  Administered 2024-01-10: 1 via ORAL
  Filled 2024-01-10: qty 1

## 2024-01-10 NOTE — ED Notes (Signed)
 Pt discharged to ED circle with family and with all belongings at his side. Pt taken in wheelchair and assisted into car. Pt ABCs intact. RR even and unlabored. Pt in NAD. Pt denies further needs from this RN.

## 2024-01-11 ENCOUNTER — Encounter: Payer: Self-pay | Admitting: Oncology

## 2024-01-11 NOTE — Telephone Encounter (Signed)
 Results complete. Copy provided to MD

## 2024-01-12 ENCOUNTER — Encounter: Payer: Self-pay | Admitting: Hospice and Palliative Medicine

## 2024-01-12 ENCOUNTER — Encounter: Payer: Self-pay | Admitting: Intensive Care

## 2024-01-12 ENCOUNTER — Encounter: Payer: Self-pay | Admitting: Oncology

## 2024-01-12 ENCOUNTER — Other Ambulatory Visit: Payer: Self-pay

## 2024-01-12 ENCOUNTER — Inpatient Hospital Stay: Payer: Medicare Other | Admitting: Oncology

## 2024-01-12 ENCOUNTER — Emergency Department: Payer: Medicare Other

## 2024-01-12 ENCOUNTER — Inpatient Hospital Stay
Admission: EM | Admit: 2024-01-12 | Discharge: 2024-01-16 | DRG: 871 | Disposition: A | Discharge status: Hospice | Payer: Medicare Other | Attending: Internal Medicine | Admitting: Internal Medicine

## 2024-01-12 DIAGNOSIS — Z66 Do not resuscitate: Secondary | ICD-10-CM | POA: Diagnosis present

## 2024-01-12 DIAGNOSIS — J189 Pneumonia, unspecified organism: Secondary | ICD-10-CM

## 2024-01-12 DIAGNOSIS — A419 Sepsis, unspecified organism: Principal | ICD-10-CM

## 2024-01-12 DIAGNOSIS — E860 Dehydration: Secondary | ICD-10-CM | POA: Diagnosis present

## 2024-01-12 DIAGNOSIS — E440 Moderate protein-calorie malnutrition: Secondary | ICD-10-CM | POA: Diagnosis present

## 2024-01-12 DIAGNOSIS — C7951 Secondary malignant neoplasm of bone: Secondary | ICD-10-CM | POA: Diagnosis not present

## 2024-01-12 DIAGNOSIS — E785 Hyperlipidemia, unspecified: Secondary | ICD-10-CM | POA: Diagnosis present

## 2024-01-12 DIAGNOSIS — Z7901 Long term (current) use of anticoagulants: Secondary | ICD-10-CM

## 2024-01-12 DIAGNOSIS — F17210 Nicotine dependence, cigarettes, uncomplicated: Secondary | ICD-10-CM | POA: Diagnosis present

## 2024-01-12 DIAGNOSIS — Z86718 Personal history of other venous thrombosis and embolism: Secondary | ICD-10-CM

## 2024-01-12 DIAGNOSIS — R9389 Abnormal findings on diagnostic imaging of other specified body structures: Secondary | ICD-10-CM | POA: Diagnosis not present

## 2024-01-12 DIAGNOSIS — J209 Acute bronchitis, unspecified: Secondary | ICD-10-CM | POA: Diagnosis present

## 2024-01-12 DIAGNOSIS — W19XXXD Unspecified fall, subsequent encounter: Secondary | ICD-10-CM | POA: Diagnosis present

## 2024-01-12 DIAGNOSIS — E872 Acidosis, unspecified: Secondary | ICD-10-CM | POA: Diagnosis present

## 2024-01-12 DIAGNOSIS — J439 Emphysema, unspecified: Secondary | ICD-10-CM | POA: Diagnosis present

## 2024-01-12 DIAGNOSIS — C3412 Malignant neoplasm of upper lobe, left bronchus or lung: Secondary | ICD-10-CM | POA: Diagnosis not present

## 2024-01-12 DIAGNOSIS — R918 Other nonspecific abnormal finding of lung field: Secondary | ICD-10-CM | POA: Diagnosis not present

## 2024-01-12 DIAGNOSIS — J90 Pleural effusion, not elsewhere classified: Secondary | ICD-10-CM

## 2024-01-12 DIAGNOSIS — S2231XA Fracture of one rib, right side, initial encounter for closed fracture: Secondary | ICD-10-CM | POA: Diagnosis not present

## 2024-01-12 DIAGNOSIS — R54 Age-related physical debility: Secondary | ICD-10-CM | POA: Diagnosis present

## 2024-01-12 DIAGNOSIS — D72829 Elevated white blood cell count, unspecified: Secondary | ICD-10-CM | POA: Diagnosis not present

## 2024-01-12 DIAGNOSIS — G4700 Insomnia, unspecified: Secondary | ICD-10-CM | POA: Diagnosis present

## 2024-01-12 DIAGNOSIS — I10 Essential (primary) hypertension: Secondary | ICD-10-CM | POA: Diagnosis present

## 2024-01-12 DIAGNOSIS — Z7189 Other specified counseling: Secondary | ICD-10-CM | POA: Diagnosis not present

## 2024-01-12 DIAGNOSIS — Z515 Encounter for palliative care: Secondary | ICD-10-CM

## 2024-01-12 DIAGNOSIS — Z85118 Personal history of other malignant neoplasm of bronchus and lung: Secondary | ICD-10-CM | POA: Diagnosis not present

## 2024-01-12 DIAGNOSIS — J910 Malignant pleural effusion: Secondary | ICD-10-CM | POA: Diagnosis present

## 2024-01-12 DIAGNOSIS — I6389 Other cerebral infarction: Secondary | ICD-10-CM | POA: Diagnosis not present

## 2024-01-12 DIAGNOSIS — I2510 Atherosclerotic heart disease of native coronary artery without angina pectoris: Secondary | ICD-10-CM | POA: Diagnosis present

## 2024-01-12 DIAGNOSIS — S2239XA Fracture of one rib, unspecified side, initial encounter for closed fracture: Secondary | ICD-10-CM | POA: Insufficient documentation

## 2024-01-12 DIAGNOSIS — F05 Delirium due to known physiological condition: Secondary | ICD-10-CM | POA: Diagnosis not present

## 2024-01-12 DIAGNOSIS — I700 Atherosclerosis of aorta: Secondary | ICD-10-CM | POA: Diagnosis present

## 2024-01-12 DIAGNOSIS — Z6822 Body mass index (BMI) 22.0-22.9, adult: Secondary | ICD-10-CM

## 2024-01-12 DIAGNOSIS — S2231XD Fracture of one rib, right side, subsequent encounter for fracture with routine healing: Secondary | ICD-10-CM

## 2024-01-12 DIAGNOSIS — R443 Hallucinations, unspecified: Secondary | ICD-10-CM | POA: Diagnosis present

## 2024-01-12 DIAGNOSIS — Z9889 Other specified postprocedural states: Secondary | ICD-10-CM

## 2024-01-12 HISTORY — DX: Malignant neoplasm of unspecified part of unspecified bronchus or lung: C34.90

## 2024-01-12 LAB — CBC WITH DIFFERENTIAL/PLATELET
Abs Immature Granulocytes: 0.51 10*3/uL — ABNORMAL HIGH (ref 0.00–0.07)
Basophils Absolute: 0.1 10*3/uL (ref 0.0–0.1)
Basophils Relative: 1 %
Eosinophils Absolute: 0.1 10*3/uL (ref 0.0–0.5)
Eosinophils Relative: 0 %
HCT: 48.5 % (ref 39.0–52.0)
Hemoglobin: 16.2 g/dL (ref 13.0–17.0)
Immature Granulocytes: 4 %
Lymphocytes Relative: 9 %
Lymphs Abs: 1.1 10*3/uL (ref 0.7–4.0)
MCH: 31 pg (ref 26.0–34.0)
MCHC: 33.4 g/dL (ref 30.0–36.0)
MCV: 92.7 fL (ref 80.0–100.0)
Monocytes Absolute: 1.4 10*3/uL — ABNORMAL HIGH (ref 0.1–1.0)
Monocytes Relative: 11 %
Neutro Abs: 9.8 10*3/uL — ABNORMAL HIGH (ref 1.7–7.7)
Neutrophils Relative %: 75 %
Platelets: 63 10*3/uL — ABNORMAL LOW (ref 150–400)
RBC: 5.23 MIL/uL (ref 4.22–5.81)
RDW: 13.2 % (ref 11.5–15.5)
WBC: 13 10*3/uL — ABNORMAL HIGH (ref 4.0–10.5)
nRBC: 0 % (ref 0.0–0.2)

## 2024-01-12 LAB — URINALYSIS, W/ REFLEX TO CULTURE (INFECTION SUSPECTED)
Bacteria, UA: NONE SEEN
Bilirubin Urine: NEGATIVE
Glucose, UA: NEGATIVE mg/dL
Hgb urine dipstick: NEGATIVE
Ketones, ur: NEGATIVE mg/dL
Leukocytes,Ua: NEGATIVE
Nitrite: NEGATIVE
Protein, ur: NEGATIVE mg/dL
Specific Gravity, Urine: 1.014 (ref 1.005–1.030)
Squamous Epithelial / HPF: 0 /[HPF] (ref 0–5)
pH: 5 (ref 5.0–8.0)

## 2024-01-12 LAB — COMPREHENSIVE METABOLIC PANEL
ALT: 23 U/L (ref 0–44)
AST: 41 U/L (ref 15–41)
Albumin: 3.3 g/dL — ABNORMAL LOW (ref 3.5–5.0)
Alkaline Phosphatase: 1322 U/L — ABNORMAL HIGH (ref 38–126)
Anion gap: 16 — ABNORMAL HIGH (ref 5–15)
BUN: 23 mg/dL (ref 8–23)
CO2: 21 mmol/L — ABNORMAL LOW (ref 22–32)
Calcium: 10.4 mg/dL — ABNORMAL HIGH (ref 8.9–10.3)
Chloride: 101 mmol/L (ref 98–111)
Creatinine, Ser: 1.31 mg/dL — ABNORMAL HIGH (ref 0.61–1.24)
GFR, Estimated: 58 mL/min — ABNORMAL LOW (ref >60)
Glucose, Bld: 108 mg/dL — ABNORMAL HIGH (ref 70–99)
Potassium: 4.1 mmol/L (ref 3.5–5.1)
Sodium: 138 mmol/L (ref 135–145)
Total Bilirubin: 1 mg/dL (ref 0.0–1.2)
Total Protein: 7.2 g/dL (ref 6.5–8.1)

## 2024-01-12 LAB — PROTIME-INR
INR: 3.8 — ABNORMAL HIGH (ref 0.8–1.2)
Prothrombin Time: 38 s — ABNORMAL HIGH (ref 11.4–15.2)

## 2024-01-12 LAB — LACTIC ACID, PLASMA
Lactic Acid, Venous: 2.3 mmol/L (ref 0.5–1.9)
Lactic Acid, Venous: 1.3 mmol/L (ref 0.5–1.9)

## 2024-01-12 MED ORDER — OSIMERTINIB MESYLATE 80 MG PO TABS: 80.0000 mg | ORAL_TABLET | Freq: Every day | ORAL | Status: DC

## 2024-01-12 MED ORDER — ENSURE ENLIVE PO LIQD: 237.0000 mL | Freq: Two times a day (BID) | ORAL | Status: DC

## 2024-01-12 MED ORDER — DRONABINOL 2.5 MG PO CAPS
2.5000 mg | ORAL_CAPSULE | Freq: Every day | ORAL | Status: DC
  Administered 2024-01-13 – 2024-01-15 (×3): 2.5 mg via ORAL
  Filled 2024-01-12 (×4): qty 1

## 2024-01-12 MED ORDER — SODIUM CHLORIDE 0.9 % IV SOLN
2.0000 g | Freq: Once | INTRAVENOUS | Status: AC
  Administered 2024-01-12: 2 g via INTRAVENOUS
  Filled 2024-01-12: qty 12.5

## 2024-01-12 MED ORDER — ONDANSETRON HCL 4 MG/2ML IJ SOLN
4.0000 mg | Freq: Once | INTRAMUSCULAR | Status: AC
  Administered 2024-01-12: 4 mg via INTRAVENOUS
  Filled 2024-01-12: qty 2

## 2024-01-12 MED ORDER — RIVAROXABAN 15 MG PO TABS
15.0000 mg | ORAL_TABLET | Freq: Two times a day (BID) | ORAL | Status: DC
Start: 2024-01-12 — End: 2024-01-16
  Administered 2024-01-12 – 2024-01-16 (×8): 15 mg via ORAL
  Filled 2024-01-12 (×9): qty 1

## 2024-01-12 MED ORDER — HYDROCODONE-ACETAMINOPHEN 5-325 MG PO TABS: 1.0000 | ORAL_TABLET | ORAL | Status: DC | PRN

## 2024-01-12 MED ORDER — SODIUM CHLORIDE 0.9 % IV SOLN: INTRAVENOUS | Status: DC

## 2024-01-12 MED ORDER — SODIUM CHLORIDE 0.9 % IV SOLN
1.0000 g | INTRAVENOUS | Status: DC
  Administered 2024-01-12 – 2024-01-15 (×4): 1 g via INTRAVENOUS
  Filled 2024-01-12 (×4): qty 10

## 2024-01-12 MED ORDER — VANCOMYCIN HCL IN DEXTROSE 1-5 GM/200ML-% IV SOLN
1000.0000 mg | Freq: Once | INTRAVENOUS | Status: AC
  Administered 2024-01-12: 1000 mg via INTRAVENOUS
  Filled 2024-01-12: qty 200

## 2024-01-12 MED ORDER — IPRATROPIUM-ALBUTEROL 0.5-2.5 (3) MG/3ML IN SOLN
3.0000 mL | Freq: Four times a day (QID) | RESPIRATORY_TRACT | Status: DC
  Administered 2024-01-12 (×2): 3 mL via RESPIRATORY_TRACT
  Filled 2024-01-12 (×2): qty 3

## 2024-01-12 MED ORDER — BUDESONIDE 0.25 MG/2ML IN SUSP
0.2500 mg | Freq: Two times a day (BID) | RESPIRATORY_TRACT | Status: DC
  Administered 2024-01-12 – 2024-01-16 (×8): 0.25 mg via RESPIRATORY_TRACT
  Filled 2024-01-12 (×7): qty 2

## 2024-01-12 MED ORDER — BENZONATATE 100 MG PO CAPS
100.0000 mg | ORAL_CAPSULE | Freq: Three times a day (TID) | ORAL | Status: DC | PRN
  Administered 2024-01-12 – 2024-01-15 (×4): 100 mg via ORAL
  Filled 2024-01-12 (×4): qty 1

## 2024-01-12 MED ORDER — SODIUM CHLORIDE 0.9 % IV BOLUS (SEPSIS)
1000.0000 mL | Freq: Once | INTRAVENOUS | Status: AC
  Administered 2024-01-12: 1000 mL via INTRAVENOUS

## 2024-01-12 MED ORDER — RIVAROXABAN 20 MG PO TABS: 20.0000 mg | ORAL_TABLET | Freq: Every day | ORAL | Status: DC

## 2024-01-12 MED ORDER — HYDROCOD POLI-CHLORPHE POLI ER 10-8 MG/5ML PO SUER: 5.0000 mL | Freq: Every evening | ORAL | Status: DC | PRN

## 2024-01-12 MED ORDER — HYDROMORPHONE HCL 1 MG/ML IJ SOLN
0.5000 mg | INTRAMUSCULAR | Status: DC | PRN
  Administered 2024-01-12 – 2024-01-13 (×2): 0.5 mg via INTRAVENOUS
  Filled 2024-01-12 (×2): qty 0.5

## 2024-01-12 MED ORDER — MORPHINE SULFATE (PF) 4 MG/ML IV SOLN
4.0000 mg | Freq: Once | INTRAVENOUS | Status: AC
  Administered 2024-01-12: 4 mg via INTRAVENOUS
  Filled 2024-01-12: qty 1

## 2024-01-12 MED ORDER — MIRTAZAPINE 15 MG PO TABS
7.5000 mg | ORAL_TABLET | Freq: Every day | ORAL | Status: DC
  Administered 2024-01-12 – 2024-01-13 (×2): 7.5 mg via ORAL
  Filled 2024-01-12 (×2): qty 1

## 2024-01-12 MED ORDER — ONDANSETRON HCL 4 MG PO TABS: 8.0000 mg | ORAL_TABLET | Freq: Three times a day (TID) | ORAL | Status: DC | PRN

## 2024-01-12 MED ORDER — LACTULOSE 10 GM/15ML PO SOLN: 10.0000 g | Freq: Two times a day (BID) | ORAL | Status: DC | PRN

## 2024-01-12 MED ORDER — ACETAMINOPHEN 500 MG PO TABS
500.0000 mg | ORAL_TABLET | Freq: Four times a day (QID) | ORAL | Status: DC | PRN
  Administered 2024-01-15: 500 mg via ORAL
  Filled 2024-01-12: qty 1

## 2024-01-12 MED ORDER — GUAIFENESIN ER 600 MG PO TB12
1200.0000 mg | ORAL_TABLET | Freq: Two times a day (BID) | ORAL | Status: DC
  Administered 2024-01-12 – 2024-01-16 (×8): 1200 mg via ORAL
  Filled 2024-01-12 (×8): qty 2

## 2024-01-12 MED ORDER — SODIUM CHLORIDE 0.9 % IV SOLN
500.0000 mg | INTRAVENOUS | Status: AC
  Administered 2024-01-12 – 2024-01-13 (×2): 500 mg via INTRAVENOUS
  Filled 2024-01-12 (×2): qty 5

## 2024-01-12 NOTE — Assessment & Plan Note (Signed)
 Likely due to poor oral intake/dehydration as well as malignancy.  If he desires treatment, recommend him to go to ER for evaluation, IV hydration and consider IV pamidronate.

## 2024-01-12 NOTE — Progress Notes (Signed)
 HEMATOLOGY-ONCOLOGY TeleHEALTH VISIT PROGRESS NOTE  I connected with Joe Morton on 01/12/24  at 12:30 PM EST by telephone visit and verified that I am speaking with the correct person using two identifiers. I discussed the limitations, risks, security and privacy concerns of performing an evaluation and management service by telemedicine and the availability of in-person appointments. The patient expressed understanding and agreed to proceed.   Other persons participating in the visit and their role in the encounter:  Wife   Patient's location: Home  Provider's location: office Chief Complaint: Stage IV lung caner   INTERVAL HISTORY Joe Morton is a 73 y.o. male who has above history reviewed by me today presents for follow up visit for management of stage IV lung cancer.  Patient's wife called in this morning to report that patient has " done fighting" and would like to have hospice to be involved.  I called patient and his wife.   01/09/2024 ER visit due to worse cough, weakness, s/p fall CT chest abdomen pelvis w contrast showed interval increase in left upper lobe consolidation. He was offered admission for pneumonia, and patient preferred to be go home and try oral antibiotics.   01/09/24 ED blood work showed hypercalcemia of 11.1  Per wife,  His cough remains 'very wet' and has not improved, exacerbating pain from a broken rib. He experiences increased weakness and fatigue, often falling asleep during conversations.His condition is declining with increased weakness, decreased appetite, and significant weight loss. He consumes minimal food and fluids daily. He has tried megace and Remeron with no improvement of his appetite. No fever or chills.   He has stage IV lung cancer with EGFR [id number] [ Patient was recently started on Osimertinib on 2/17.2025.   Review of Systems  Constitutional:  Positive for appetite change, fatigue and unexpected weight change. Negative for chills and  fever.  HENT:   Negative for voice change.   Respiratory:  Positive for cough. Negative for chest tightness and shortness of breath.   Cardiovascular:  Negative for chest pain.  Gastrointestinal:  Negative for abdominal pain.  Genitourinary:  Negative for difficulty urinating.   Musculoskeletal:  Negative for arthralgias.  Skin:  Negative for itching and rash.  Neurological:        Sleeps a lot.   Hematological:  Does not bruise/bleed easily.  Psychiatric/Behavioral:  Negative for confusion.     Past Medical History:  Diagnosis Date   Emphysema lung (HCC)    Hypertension    Past Surgical History:  Procedure Laterality Date   BRONCHIAL BIOPSY  12/21/2023   Procedure: BRONCHIAL BIOPSIES;  Surgeon: [name], MD;  Location: [facility];  Service: Pulmonary;;   BRONCHIAL NEEDLE ASPIRATION BIOPSY  12/21/2023   Procedure: BRONCHIAL NEEDLE ASPIRATION BIOPSIES;  Surgeon: [name], MD;  Location: [facility];  Service: Pulmonary;;   BRONCHIAL WASHINGS  12/21/2023   Procedure: BRONCHIAL WASHINGS;  Surgeon: [name], MD;  Location: [facility];  Service: Pulmonary;;   COLONOSCOPY     ENDOBRONCHIAL ULTRASOUND Bilateral 12/21/2023   Procedure: ENDOBRONCHIAL ULTRASOUND;  Surgeon: [name], MD;  Location: [facility];  Service: Pulmonary;  Laterality: Bilateral;   FINE NEEDLE ASPIRATION  12/21/2023   Procedure: FINE NEEDLE ASPIRATION (FNA) LINEAR;  Surgeon: [name], MD;  Location: [facility];  Service: Pulmonary;;   INGUINAL HERNIA REPAIR Left 09/17/2018   Procedure: OPEN LEFT INGUINAL HERNIA REPAIR WITH MESH;  Surgeon: Jimmye Norman, MD;  Location: [facility] OR;  Service: General;  Laterality: Left;  GENERAL AND LMA   INSERTION OF MESH Left 09/17/2018   Procedure: INSERTION OF MESH;  Surgeon: Jimmye Norman, MD;  Location: [facility] OR;  Service: General;  Laterality: Left;  GENERAL AND LMA    No family history on file.  Social History    Socioeconomic History   Marital status: Married    Spouse name: Not on file   Number of children: Not on file   Years of education: Not on file   Highest education level: Not on file  Occupational History   Not on file  Tobacco Use   Smoking status: Every Day    Current packs/day: 1.00    Average packs/day: 1 pack/day for 44.1 years (44.1 ttl pk-yrs)    Types: Cigarettes    Start date: 6   Smokeless tobacco: Never   Tobacco comments:    Smoking 3 cigarettes a day x 2 weeks   Vaping Use   Vaping status: Never Used  Substance and Sexual Activity   Alcohol use: Yes    Alcohol/week: 6.0 standard drinks of alcohol    Types: 6 Standard drinks or equivalent per week    Comment: occasionally   Drug use: No   Sexual activity: Not on file  Other Topics Concern   Not on file  Social History Narrative   Not on file   Social Drivers of Health   Financial Resource Strain: Low Risk  (12/25/2023)   Overall Financial Resource Strain (CARDIA)    Difficulty of Paying Living Expenses: Not very hard  Food Insecurity: No Food Insecurity (01/01/2024)   Hunger Vital Sign    Worried About Running Out of Food in the Last Year: Never true    Ran Out of Food in the Last Year: Never true  Transportation Needs: No Transportation Needs (01/01/2024)   PRAPARE - Administrator, Civil Service (Medical): No    Lack of Transportation (Non-Medical): No  Physical Activity: Not on file  Stress: No Stress Concern Present (12/25/2023)   [employer] of Occupational Health - Occupational Stress Questionnaire    Feeling of Stress : Only a little  Social Connections: Moderately Integrated (01/01/2024)   Social Connection and Isolation Panel [NHANES]    Frequency of Communication with Friends and Family: Three times a week    Frequency of Social Gatherings with Friends and Family: Once a week    Attends Religious Services: 1 to 4 times per year    Active Member of [employer] or Organizations: No     Attends Banker Meetings: Never    Marital Status: Married  Catering manager Violence: Not At Risk (01/01/2024)   Humiliation, Afraid, Rape, and Kick questionnaire    Fear of Current or Ex-Partner: No    Emotionally Abused: No    Physically Abused: No    Sexually Abused: No    Current Outpatient Medications on File Prior to Visit  Medication Sig Dispense Refill   Acetaminophen 500 MG capsule Take 500 mg by mouth every 6 (six) hours as needed for mild pain (pain score 1-3) or moderate pain (pain score 4-6).     amoxicillin-clavulanate (AUGMENTIN) 875-125 MG tablet Take 1 tablet by mouth 2 (two) times daily for 7 days. 14 tablet 0   azithromycin (ZITHROMAX Z-PAK) 250 MG tablet Take 2 tablets (500 mg) on  Day 1,  followed by 1 tablet (250 mg) once daily on Days 2 through 5. 6 each 0   chlorpheniramine-HYDROcodone (TUSSIONEX) 10-8 MG/5ML Take 5 mLs  by mouth at bedtime as needed for cough. 70 mL 0   HYDROcodone-acetaminophen (NORCO) 5-325 MG tablet Take 1 tablet by mouth every 4 (four) hours as needed for moderate pain (pain score 4-6).     lactulose (CHRONULAC) 10 GM/15ML solution Take 15-30 mLs (10-20 g total) by mouth 2 (two) times daily as needed for mild constipation. 236 mL 0   lidocaine (LIDODERM) 5 % Place 1 patch onto the skin every 12 (twelve) hours. Remove & Discard patch within 12 hours or as directed by MD 10 patch 0   megestrol (MEGACE) 40 MG tablet Take 2 tablets (80 mg total) by mouth 2 (two) times daily. 120 tablet 0   mirtazapine (REMERON) 7.5 MG tablet Take 1 tablet (7.5 mg total) by mouth at bedtime. 30 tablet 2   ondansetron (ZOFRAN) 8 MG tablet Take 1 tablet (8 mg total) by mouth every 8 (eight) hours as needed for nausea or vomiting. 20 tablet 0   osimertinib mesylate (TAGRISSO) 80 MG tablet Take 80 mg by mouth daily.     rivaroxaban (XARELTO) 20 MG TABS tablet Take 1 tablet (20 mg total) by mouth daily. For three weeks after taking 15 mg twice a day for three  weeks 21 tablet 1   RIVAROXABAN (XARELTO) VTE STARTER PACK (15 & 20 MG) Take 15mg  by mouth 2 (two) times daily for 21 days, then take 20mg  by mouth daily for 9 days. 51 each 0   No current facility-administered medications on file prior to visit.    No Known Allergies     Observations/Objective: There were no vitals filed for this visit. There is no height or weight on file to calculate BMI.  Physical Exam  CBC    Component Value Date/Time   WBC 12.7 (H) 01/09/2024 2253   RBC 5.01 01/09/2024 2253   HGB 15.8 01/09/2024 2253   HCT 46.2 01/09/2024 2253   PLT 98 (L) 01/09/2024 2253   MCV 92.2 01/09/2024 2253   MCH 31.5 01/09/2024 2253   MCHC 34.2 01/09/2024 2253   RDW 13.2 01/09/2024 2253   LYMPHSABS 0.8 01/09/2024 2253   MONOABS 1.6 (H) 01/09/2024 2253   EOSABS 0.1 01/09/2024 2253   BASOSABS 0.1 01/09/2024 2253    CMP     Component Value Date/Time   NA 138 01/09/2024 2253   K 3.7 01/09/2024 2253   CL 103 01/09/2024 2253   CO2 21 (L) 01/09/2024 2253   GLUCOSE 156 (H) 01/09/2024 2253   BUN 26 (H) 01/09/2024 2253   CREATININE 1.14 01/09/2024 2253   CALCIUM 11.1 (H) 01/09/2024 2253   PROT 6.8 01/09/2024 2253   ALBUMIN 3.1 (L) 01/09/2024 2253   AST 51 (H) 01/09/2024 2253   ALT 27 01/09/2024 2253   ALKPHOS 1,088 (H) 01/09/2024 2253   BILITOT 0.9 01/09/2024 2253   GFRNONAA >60 01/09/2024 2253   GFRAA >60 09/10/2018 0807     ASSESSMENT & PLAN:   Malignant neoplasm of upper lobe of left lung (HCC) 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  TPS 2, CPS 20 NGS showed EGFR [id number] [19 inframe del ]  Recently started on Osimertinib    Pneumonia Patient has been on oral antibiotics with no improvement in symptoms. If patient decides to get supportive care to buy more time for cancer treatment to work, recommend hospital admission for IV antibiotics and hydration.  Hypercalcemia Likely due to poor oral intake/dehydration as well  as malignancy.  If he  desires treatment, recommend him to go to ER for evaluation, IV hydration and consider IV pamidronate.   Goals of care, counseling/discussion Patient and family members are undecided currently.  Earlier patient has expressed interest in stopping treatments and transitioning to hospice care. -Hospice referral has been sent. If patient decides to proceed with this option, he will be enrolled in hospice care. If he would like to maximize supportive care, recommend patient to go to ER and be admitted for treatment. I have called ER triage RN and updated his information.    No orders of the defined types were placed in this encounter.   All questions were answered. The patient knows to call the clinic with any problems, questions or concerns.  Rickard Patience, MD, PhD [facility] Hematology Oncology 01/12/2024    I provided 25 minutes of non face-to-face telephone visit time during this encounter, and > 50% was spent counseling as documented under my assessment & plan.  Rickard Patience, MD 01/12/2024 12:23 PM

## 2024-01-12 NOTE — Telephone Encounter (Signed)
 Oral Oncology Patient Advocate Encounter  Pt has decided to go hospice. Closing encounter.   Patty Almedia Balls, CPhT Oncology Pharmacy Patient Advocate [facility] Direct Number: [phone] Fax: [phone]

## 2024-01-12 NOTE — ED Provider Notes (Signed)
 [facility] Provider Note    Event Date/Time   First MD Initiated Contact with Patient 01/12/24 1506     (approximate)  History   Chief Complaint: Shortness of Breath and Weakness  HPI  Joe Morton is a 73 y.o. male with a past medical history of non-small cell lung cancer on oral chemotherapy who presents to the emergency department for worsening pneumonia chest pain and cough.  Patient was seen in the emergency department approximately 3 days ago had a CT scan at that time showing pneumonia was started on Augmentin.  Yesterday patient had a fall onto his right side went to urgent care was diagnosed with worsening pneumonia in addition to rib fracture on the right side.  Patient has been experiencing significant pain due to the rib fracture and the pneumonia.  Saw his oncologist today Dr. Cathie Hoops who recommended he come to the emergency department for admission for further workup IV antibiotics and pain medication.  Physical Exam   Triage Vital Signs: ED Triage Vitals  Encounter Vitals Group     BP 01/12/24 1418 107/75     Systolic BP Percentile --      Diastolic BP Percentile --      Pulse Rate 01/12/24 1418 (!) 106     Resp 01/12/24 1418 (!) 22     Temp 01/12/24 1418 97.7 F (36.5 C)     Temp Source 01/12/24 1418 Oral     SpO2 01/12/24 1418 98 %     Weight 01/12/24 1419 148 lb (67.1 kg)     Height 01/12/24 1419 5\' 8"  (1.727 m)     Head Circumference --      Peak Flow --      Pain Score 01/12/24 1419 10     Pain Loc --      Pain Education --      Exclude from Growth Chart --     Most recent vital signs: Vitals:   01/12/24 1418 01/12/24 1445  BP: 107/75 123/86  Pulse: (!) 106 (!) 126  Resp: (!) 22 (!) 28  Temp: 97.7 F (36.5 C)   SpO2: 98% 98%    General: Awake, no distress.  CV:  Good peripheral perfusion.  Regular rate and rhythm  Resp:  Normal effort.  Equal breath sounds bilaterally.  Right lateral chest wall tenderness to  palpation Abd:  No distention.  Soft, nontender.  No rebound or guarding.  ED Results / Procedures / Treatments   EKG  EKG viewed and interpreted by myself shows sinus tachycardia at 110 bpm with a narrow QRS, normal axis, normal intervals, no concerning ST changes.  RADIOLOGY  I have reviewed and interpreted the chest x-ray images.  Patient appears to have consolidation left upper lobe.   MEDICATIONS ORDERED IN ED: Medications  sodium chloride 0.9 % bolus 1,000 mL (has no administration in time range)  vancomycin (VANCOCIN) IVPB 1000 mg/200 mL premix (has no administration in time range)  ceFEPIme (MAXIPIME) 2 g in sodium chloride 0.9 % 100 mL IVPB (has no administration in time range)  morphine (PF) 4 MG/ML injection 4 mg (has no administration in time range)  ondansetron (ZOFRAN) injection 4 mg (has no administration in time range)     IMPRESSION / MDM / ASSESSMENT AND PLAN / ED COURSE  I reviewed the triage vital signs and the nursing notes.  Patient's presentation is most consistent with acute presentation with potential threat to life or bodily function.  Patient  presents to the emergency department for worsening pneumonia now with a rib fracture after falling onto his right side yesterday.  Due to the increased chest pain with cough or breathing along with worsening pneumonia patient was seen by his oncologist and sent to the emergency department for admission to the hospital for IV antibiotics pain medication.  Patient's workup in the emergency department does show mild leukocytosis of 13,000, lactate of 2.3 chemistry shows significant alkaline phosphatase elevation likely due to the patient's oncologic process.  Mild anion gap of 16.  Patient is tachycardic and tachypneic, meeting sepsis criteria.  We will cover for HCAP with antibiotics, pain medication.  We will plan to admit to the hospitalist for further workup and treatment.  Chest x-ray confirms left upper lobe  pneumonia.  Will admit to the hospital service for further workup and treatment.  CRITICAL CARE Performed by: Minna Antis   Total critical care time: 30 minutes  Critical care time was exclusive of separately billable procedures and treating other patients.  Critical care was necessary to treat or prevent imminent or life-threatening deterioration.  Critical care was time spent personally by me on the following activities: development of treatment plan with patient and/or surrogate as well as nursing, discussions with consultants, evaluation of patient's response to treatment, examination of patient, obtaining history from patient or surrogate, ordering and performing treatments and interventions, ordering and review of laboratory studies, ordering and review of radiographic studies, pulse oximetry and re-evaluation of patient's condition.   FINAL CLINICAL IMPRESSION(S) / ED DIAGNOSES   Pneumonia Rib fracture   Note:  This document was prepared using Dragon voice recognition software and may include unintentional dictation errors.   Minna Antis, MD 01/12/24 [phone]

## 2024-01-12 NOTE — Assessment & Plan Note (Signed)
 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  TPS 2, CPS 20 NGS showed EGFR [id number] [19 inframe del ]  Recently started on Osimertinib

## 2024-01-12 NOTE — ED Triage Notes (Signed)
 Patient presents after being diagnosed with pneumonia and sent by oncologist. C/o loss of appetite and weakness  Patient has non small cell carcinoma of lung and has metastasized to bone.  Takes chemo pill once a day.

## 2024-01-12 NOTE — Assessment & Plan Note (Signed)
 Patient has been on oral antibiotics with no improvement in symptoms. If patient decides to get supportive care to buy more time for cancer treatment to work, recommend hospital admission for IV antibiotics and hydration.

## 2024-01-12 NOTE — Telephone Encounter (Signed)
 Please see other mychart message. Appt scheduled with Dr. Cathie Hoops. Pt's wife aware. Hospice referral has been placed.

## 2024-01-12 NOTE — Assessment & Plan Note (Addendum)
 Patient and family members are undecided currently.  Earlier patient has expressed interest in stopping treatments and transitioning to hospice care. -Hospice referral has been sent. If patient decides to proceed with this option, he will be enrolled in hospice care. If he would like to maximize supportive care, recommend patient to go to ER and be admitted for treatment. I have called ER triage RN and updated his information.

## 2024-01-12 NOTE — ED Notes (Signed)
Informed RN bed assigned 

## 2024-01-12 NOTE — H&P (Signed)
 History and Physical    Joe Morton:[id number] DOB: 31-Jan-1951 DOA: 01/12/2024  PCP: Emilio Aspen, MD (Confirm with patient/family/NH records and if not entered, this has to be entered at [facility] point of entry) Patient coming from: Home  I have personally briefly reviewed patient's old medical records in [facility]  Chief Complaint: Cough, SOB, feeling weak, fall and broken rib  HPI: Joe Morton is a 73 y.o. 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  Patient started develop productive cough with whitish phlegm 3 days ago, and gradually has been feeling weak and sustained a mechanical fall 3 days ago and broke his rib on the right side came to ED.  When he was diagnosed with pneumonia and sent home with Augmentin and azithromycin.  Patient however has not been feeling any improvement after taking antibiotics, he felt congested, unable to cough up phlegm because of the pain associated with rib fracture.  Wife also reported that the patient lost appetite, despite being started on Megace 1 week ago, patient has barely eaten any meal since.  Patient denies any nauseous vomiting abdominal pain or diarrhea.  Today he went to see oncologist and outpatient workup showed hypercalcemia and patient sent by oncology to ER for further evaluation.  ED Course: Afebrile, tachycardia blood pressure 107/75 O2 saturation 91% on room air.  Blood work showed WBC 13, hemoglobin 16.2, lactic acid 2.3, calcium 10.4, creatinine 1.3 bicarb 21, K4.1 alk phos 1322, AST 41 ALT 23.  Patient was given vancomycin and cefepime in the ED.  Review of Systems: As per HPI otherwise 14 point review of systems negative.   Past Medical  History:  Diagnosis Date   Emphysema lung (HCC)    Hypertension    Non-small cell carcinoma of lung (HCC)     Past Surgical History:  Procedure Laterality Date   BRONCHIAL BIOPSY  12/21/2023   Procedure: BRONCHIAL BIOPSIES;  Surgeon: [name], MD;  Location: [facility];  Service: Pulmonary;;   BRONCHIAL NEEDLE ASPIRATION BIOPSY  12/21/2023   Procedure: BRONCHIAL NEEDLE ASPIRATION BIOPSIES;  Surgeon: [name], MD;  Location: [facility];  Service: Pulmonary;;   BRONCHIAL WASHINGS  12/21/2023   Procedure: BRONCHIAL WASHINGS;  Surgeon: [name], MD;  Location: [facility];  Service: Pulmonary;;   COLONOSCOPY     ENDOBRONCHIAL ULTRASOUND Bilateral 12/21/2023   Procedure: ENDOBRONCHIAL ULTRASOUND;  Surgeon: [name], MD;  Location: [facility];  Service: Pulmonary;  Laterality: Bilateral;   FINE NEEDLE ASPIRATION  12/21/2023   Procedure: FINE NEEDLE ASPIRATION (FNA) LINEAR;  Surgeon: [name], MD;  Location: [facility];  Service: Pulmonary;;   INGUINAL HERNIA REPAIR Left 09/17/2018   Procedure: OPEN LEFT INGUINAL HERNIA REPAIR WITH MESH;  Surgeon: Jimmye Norman, MD;  Location: [facility] OR;  Service: General;  Laterality: Left;  GENERAL AND LMA   INSERTION OF MESH Left 09/17/2018   Procedure: INSERTION OF MESH;  Surgeon: Jimmye Norman, MD;  Location: [facility] OR;  Service: General;  Laterality: Left;  GENERAL AND LMA     reports that he quit smoking about 44 years ago. His smoking use included cigarettes. He has never used smokeless tobacco. He reports that he does not currently use alcohol after a past usage of about 6.0 standard drinks of  alcohol per week. He reports that he does not use drugs.  No Known Allergies  No family history on file.   Prior to Admission medications   Medication Sig Start Date End Date Taking? Authorizing Provider  Acetaminophen 500 MG capsule Take 500 mg by mouth every 6 (six) hours as needed for mild pain (pain  score 1-3) or moderate pain (pain score 4-6).    [provider]  amoxicillin-clavulanate (AUGMENTIN) 875-125 MG tablet Take 1 tablet by mouth 2 (two) times daily for 7 days. 01/10/24 01/17/24  Chesley Noon, MD  azithromycin (ZITHROMAX Z-PAK) 250 MG tablet Take 2 tablets (500 mg) on  Day 1,  followed by 1 tablet (250 mg) once daily on Days 2 through 5. 01/10/24 01/15/24  Chesley Noon, MD  chlorpheniramine-HYDROcodone (TUSSIONEX) 10-8 MG/5ML Take 5 mLs by mouth at bedtime as needed for cough. 01/05/24   Borders, Daryl Eastern, NP  HYDROcodone-acetaminophen (NORCO) 5-325 MG tablet Take 1 tablet by mouth every 4 (four) hours as needed for moderate pain (pain score 4-6). 01/05/24   Borders, Daryl Eastern, NP  lactulose (CHRONULAC) 10 GM/15ML solution Take 15-30 mLs (10-20 g total) by mouth 2 (two) times daily as needed for mild constipation. 01/09/24   Borders, Daryl Eastern, NP  lidocaine (LIDODERM) 5 % Place 1 patch onto the skin every 12 (twelve) hours. Remove & Discard patch within 12 hours or as directed by MD 01/10/24 01/09/25  Chesley Noon, MD  megestrol (MEGACE) 40 MG tablet Take 2 tablets (80 mg total) by mouth 2 (two) times daily. 01/05/24   Rickard Patience, MD  mirtazapine (REMERON) 7.5 MG tablet Take 1 tablet (7.5 mg total) by mouth at bedtime. 12/28/23   Borders, Daryl Eastern, NP  ondansetron (ZOFRAN) 8 MG tablet Take 1 tablet (8 mg total) by mouth every 8 (eight) hours as needed for nausea or vomiting. 12/28/23   Borders, Daryl Eastern, NP  osimertinib mesylate (TAGRISSO) 80 MG tablet Take 80 mg by mouth daily.    Rickard Patience, MD  rivaroxaban (XARELTO) 20 MG TABS tablet Take 1 tablet (20 mg total) by mouth daily. For three weeks after taking 15 mg twice a day for three weeks 01/03/24   Wouk, Wilfred Curtis, MD  RIVAROXABAN Carlena Hurl) VTE STARTER PACK (15 & 20 MG) Take 15mg  by mouth 2 (two) times daily for 21 days, then take 20mg  by mouth daily for 9 days. 01/03/24   Kathrynn Running, MD    Physical Exam: Vitals:    01/12/24 1418 01/12/24 1419 01/12/24 1445 01/12/24 1600  BP: 107/75  123/86 114/81  Pulse: (!) 106  (!) 126 (!) 108  Resp: (!) 22  (!) 28 (!) 26  Temp: 97.7 F (36.5 C)     TempSrc: Oral     SpO2: 98%  98% 91%  Weight:  67.1 kg    Height:  5\' 8"  (1.727 m)      Constitutional: NAD, calm, comfortable Vitals:   01/12/24 1418 01/12/24 1419 01/12/24 1445 01/12/24 1600  BP: 107/75  123/86 114/81  Pulse: (!) 106  (!) 126 (!) 108  Resp: (!) 22  (!) 28 (!) 26  Temp: 97.7 F (36.5 C)     TempSrc: Oral     SpO2: 98%  98% 91%  Weight:  67.1 kg    Height:  5\' 8"  (1.727 m)     Eyes: PERRL, lids and conjunctivae normal ENMT: Mucous membranes are dry. Posterior pharynx clear of any exudate or lesions.Normal dentition.  Neck:  normal, supple, no masses, no thyromegaly Respiratory: clear to auscultation bilaterally, right-sided crackles and wheezing right more than left, increasing respiratory effort. No accessory muscle use.  Cardiovascular: Regular rate and rhythm, no murmurs / rubs / gallops. No extremity edema. 2+ pedal pulses. No carotid bruits.  Abdomen: no tenderness, no masses palpated. No hepatosplenomegaly. Bowel sounds positive.  Musculoskeletal: no clubbing / cyanosis. No joint deformity upper and lower extremities. Good ROM, no contractures. Normal muscle tone.  Skin: no rashes, lesions, ulcers. No induration Neurologic: CN 2-12 grossly intact. Sensation intact, DTR normal. Strength 5/5 in all 4.  Psychiatric: Normal judgment and insight. Alert and oriented x 3. Normal mood.    Labs on Admission: I have personally reviewed following labs and imaging studies  CBC: Recent Labs  Lab 01/08/24 1534 01/09/24 2253 01/12/24 1420  WBC 14.3* 12.7* 13.0*  NEUTROABS  --  9.8* 9.8*  HGB 16.7 15.8 16.2  HCT 49.0 46.2 48.5  MCV 93.0 92.2 92.7  PLT 123* 98* 63*   Basic Metabolic Panel: Recent Labs  Lab 01/08/24 1534 01/09/24 2253 01/12/24 1420  NA 141 138 138  K 4.1 3.7 4.1  CL  105 103 101  CO2 22 21* 21*  GLUCOSE 108* 156* 108*  BUN 20 26* 23  CREATININE 1.26* 1.14 1.31*  CALCIUM 11.0* 11.1* 10.4*   GFR: Estimated Creatinine Clearance: 49.1 mL/min (A) (by C-G formula based on SCr of 1.31 mg/dL (H)). Liver Function Tests: Recent Labs  Lab 01/09/24 2253 01/12/24 1420  AST 51* 41  ALT 27 23  ALKPHOS 1,088* 1,322*  BILITOT 0.9 1.0  PROT 6.8 7.2  ALBUMIN 3.1* 3.3*   No results for input(s): "LIPASE", "AMYLASE" in the last 168 hours. No results for input(s): "AMMONIA" in the last 168 hours. Coagulation Profile: Recent Labs  Lab 01/12/24 1420  INR 3.8*   Cardiac Enzymes: No results for input(s): "CKTOTAL", "CKMB", "CKMBINDEX", "TROPONINI" in the last 168 hours. BNP (last 3 results) No results for input(s): "PROBNP" in the last 8760 hours. HbA1C: No results for input(s): "HGBA1C" in the last 72 hours. CBG: No results for input(s): "GLUCAP" in the last 168 hours. Lipid Profile: No results for input(s): "CHOL", "HDL", "LDLCALC", "TRIG", "CHOLHDL", "LDLDIRECT" in the last 72 hours. Thyroid Function Tests: No results for input(s): "TSH", "T4TOTAL", "FREET4", "T3FREE", "THYROIDAB" in the last 72 hours. Anemia Panel: No results for input(s): "VITAMINB12", "FOLATE", "FERRITIN", "TIBC", "IRON", "RETICCTPCT" in the last 72 hours. Urine analysis:    Component Value Date/Time   COLORURINE YELLOW (A) 01/08/2024 1551   APPEARANCEUR CLEAR (A) 01/08/2024 1551   LABSPEC 1.023 01/08/2024 1551   PHURINE 5.0 01/08/2024 1551   GLUCOSEU NEGATIVE 01/08/2024 1551   HGBUR NEGATIVE 01/08/2024 1551   BILIRUBINUR NEGATIVE 01/08/2024 1551   KETONESUR 5 (A) 01/08/2024 1551   PROTEINUR 30 (A) 01/08/2024 1551   NITRITE NEGATIVE 01/08/2024 1551   LEUKOCYTESUR NEGATIVE 01/08/2024 1551    Radiological Exams on Admission: DG Chest 1 View Result Date: 01/12/2024 CLINICAL DATA:  Pneumonia, known metastatic lung cancer EXAM: CHEST  1 VIEW COMPARISON:  01/09/2024 FINDINGS:  Two frontal views of the chest demonstrate a stable cardiac silhouette. Hazy opacity within the left hemithorax consistent with the left upper lobe consolidation and underlying mass as identified on multiple prior CT and PET scans. Chronic elevation of the left hemidiaphragm. Stable left pleural effusion. Right chest is clear. No acute fractures. IMPRESSION: 1. Continued left upper lobe consolidation compatible with known left hilar mass and postobstructive changes  as seen on multiple prior exams. 2. Chronic elevation of the left hemidiaphragm, with stable left pleural effusion. Electronically Signed   By: Sharlet Salina M.D.   On: 01/12/2024 16:27    EKG: Independently reviewed.  Sinus tachycardia, no acute ST changes.  Assessment/Plan Principal Problem:   Pneumonia Active Problems:   CAP (community acquired pneumonia)   Hypercalcemia   Rib fracture  (please populate well all problems here in Problem List. (For example, if patient is on BP meds at home and you resume or decide to hold them, it is a problem that needs to be her. Same for CAD, COPD, HLD and so on)  CAP, bacterial, probably post obstructive right upper lobe, failed outpatient management With impending hypoxic respiratory failure Acute bronchitis -O2 saturation 91% on room air, dropped to 89-90% with short conversation -Escalate antibiotic coverage from p.o. Augmentin and azithromycin to IV ceftriaxone and azithromycin -Breathing treatment with DuoNebs and trial of Pulmicort -Incentive spirometry and flutter valve -Sputum culture  Hypercalcemia -No mentation changes -Probably secondary to dehydration -IV fluid -Repeat calcium level tomorrow, if no improvement, consider biphosphate treatment  SIRS -Tachycardia, elevated lactic acid, likely secondary to volume contraction from poor oral intake, doubt sepsis  Anorexia Moderate protein calorie malnutrition -Not responding to Megace.  Trial of Marinol  Generalized  weakness Mechanical fall and ambulation impairment -PT evaluation  RLE DVT -Diagnosed on last admission, still on Xarelto loading  Stage IV LLL non-small cell lung carcinoma -Continue immunotherapy  DVT prophylaxis: Xarelto Code Status: DNR Family Communication: Wife at bedside Disposition Plan: Expect less than 2 midnight hospital stay Consults called: None Admission status: Tele obs   Emeline General MD [facility] Pager [phone] 01/12/2024, 4:45 PM

## 2024-01-13 DIAGNOSIS — Z66 Do not resuscitate: Secondary | ICD-10-CM | POA: Diagnosis present

## 2024-01-13 DIAGNOSIS — Z9889 Other specified postprocedural states: Secondary | ICD-10-CM | POA: Diagnosis not present

## 2024-01-13 DIAGNOSIS — R443 Hallucinations, unspecified: Secondary | ICD-10-CM | POA: Diagnosis present

## 2024-01-13 DIAGNOSIS — G4700 Insomnia, unspecified: Secondary | ICD-10-CM | POA: Diagnosis present

## 2024-01-13 DIAGNOSIS — Z7901 Long term (current) use of anticoagulants: Secondary | ICD-10-CM | POA: Diagnosis not present

## 2024-01-13 DIAGNOSIS — J439 Emphysema, unspecified: Secondary | ICD-10-CM | POA: Diagnosis present

## 2024-01-13 DIAGNOSIS — I6389 Other cerebral infarction: Secondary | ICD-10-CM | POA: Diagnosis not present

## 2024-01-13 DIAGNOSIS — I700 Atherosclerosis of aorta: Secondary | ICD-10-CM | POA: Diagnosis present

## 2024-01-13 DIAGNOSIS — J910 Malignant pleural effusion: Secondary | ICD-10-CM | POA: Diagnosis present

## 2024-01-13 DIAGNOSIS — A419 Sepsis, unspecified organism: Secondary | ICD-10-CM | POA: Diagnosis not present

## 2024-01-13 DIAGNOSIS — C3412 Malignant neoplasm of upper lobe, left bronchus or lung: Secondary | ICD-10-CM

## 2024-01-13 DIAGNOSIS — E872 Acidosis, unspecified: Secondary | ICD-10-CM | POA: Diagnosis present

## 2024-01-13 DIAGNOSIS — I10 Essential (primary) hypertension: Secondary | ICD-10-CM | POA: Diagnosis present

## 2024-01-13 DIAGNOSIS — E785 Hyperlipidemia, unspecified: Secondary | ICD-10-CM | POA: Diagnosis present

## 2024-01-13 DIAGNOSIS — E440 Moderate protein-calorie malnutrition: Secondary | ICD-10-CM | POA: Diagnosis present

## 2024-01-13 DIAGNOSIS — F17210 Nicotine dependence, cigarettes, uncomplicated: Secondary | ICD-10-CM | POA: Diagnosis present

## 2024-01-13 DIAGNOSIS — C7951 Secondary malignant neoplasm of bone: Secondary | ICD-10-CM | POA: Diagnosis not present

## 2024-01-13 DIAGNOSIS — J209 Acute bronchitis, unspecified: Secondary | ICD-10-CM | POA: Diagnosis present

## 2024-01-13 DIAGNOSIS — F05 Delirium due to known physiological condition: Secondary | ICD-10-CM | POA: Diagnosis not present

## 2024-01-13 DIAGNOSIS — J189 Pneumonia, unspecified organism: Secondary | ICD-10-CM

## 2024-01-13 DIAGNOSIS — R54 Age-related physical debility: Secondary | ICD-10-CM | POA: Diagnosis present

## 2024-01-13 DIAGNOSIS — J90 Pleural effusion, not elsewhere classified: Secondary | ICD-10-CM | POA: Diagnosis not present

## 2024-01-13 DIAGNOSIS — S2231XD Fracture of one rib, right side, subsequent encounter for fracture with routine healing: Secondary | ICD-10-CM

## 2024-01-13 DIAGNOSIS — Z515 Encounter for palliative care: Secondary | ICD-10-CM | POA: Diagnosis not present

## 2024-01-13 DIAGNOSIS — C3492 Malignant neoplasm of unspecified part of left bronchus or lung: Secondary | ICD-10-CM | POA: Diagnosis not present

## 2024-01-13 DIAGNOSIS — E860 Dehydration: Secondary | ICD-10-CM | POA: Diagnosis present

## 2024-01-13 DIAGNOSIS — Z7189 Other specified counseling: Secondary | ICD-10-CM | POA: Diagnosis not present

## 2024-01-13 DIAGNOSIS — I2510 Atherosclerotic heart disease of native coronary artery without angina pectoris: Secondary | ICD-10-CM | POA: Diagnosis present

## 2024-01-13 DIAGNOSIS — W19XXXD Unspecified fall, subsequent encounter: Secondary | ICD-10-CM | POA: Diagnosis present

## 2024-01-13 LAB — BASIC METABOLIC PANEL
Anion gap: 12 (ref 5–15)
BUN: 20 mg/dL (ref 8–23)
CO2: 19 mmol/L — ABNORMAL LOW (ref 22–32)
Calcium: 9.6 mg/dL (ref 8.9–10.3)
Chloride: 109 mmol/L (ref 98–111)
Creatinine, Ser: 1.04 mg/dL (ref 0.61–1.24)
GFR, Estimated: >60 mL/min (ref >60)
Glucose, Bld: 96 mg/dL (ref 70–99)
Potassium: 3.8 mmol/L (ref 3.5–5.1)
Sodium: 140 mmol/L (ref 135–145)

## 2024-01-13 LAB — EXPECTORATED SPUTUM ASSESSMENT W GRAM STAIN, RFLX TO RESP C

## 2024-01-13 LAB — PROCALCITONIN: Procalcitonin: 0.76 ng/mL

## 2024-01-13 MED ORDER — AZITHROMYCIN 500 MG PO TABS
500.0000 mg | ORAL_TABLET | Freq: Every day | ORAL | Status: DC
  Administered 2024-01-14 – 2024-01-16 (×3): 500 mg via ORAL
  Filled 2024-01-13 (×3): qty 1

## 2024-01-13 MED ORDER — IPRATROPIUM-ALBUTEROL 0.5-2.5 (3) MG/3ML IN SOLN
3.0000 mL | Freq: Three times a day (TID) | RESPIRATORY_TRACT | Status: DC
  Administered 2024-01-13 – 2024-01-16 (×10): 3 mL via RESPIRATORY_TRACT
  Filled 2024-01-13 (×11): qty 3

## 2024-01-13 MED ORDER — LIDOCAINE 5 % EX PTCH
1.0000 | MEDICATED_PATCH | CUTANEOUS | Status: DC
  Administered 2024-01-13 – 2024-01-15 (×3): 1 via TRANSDERMAL
  Filled 2024-01-13 (×3): qty 1

## 2024-01-13 NOTE — Evaluation (Signed)
 Physical Therapy Evaluation Patient Details Name: ABDEL EFFINGER MRN: [id number] DOB: January 31, 1951 Today's Date: 01/13/2024  History of Present Illness  Pt admitted to [facility] on 01/12/24 under observation for c/o loss of appetite and weakness secondary to recent PNA diagnosis. Recent admission 2/18 for mechanical fall resulting in R 5th rib fx. 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   Clinical Impression  Pt is a 73 year old M admitted to hospital on 01/12/24 for PNA. At baseline, pt being IND with ADL's, IADL's, ambulation without AD.   Pt presents with decreased activity tolerance, increased O2 dependence from baseline, and decreased standing balance, resulting in impaired functional mobility from baseline. Due to deficits, pt was mod I for bed mobility, supervision for transfers, and supervision to ambulate 51ft with RW. Pt likely to require supplemental O2 at DC based on walk test results.  Walk test performed: initiated with pt at 94% on RA. SpO2 dropped to 82% on RA after ambulating 4ft with RW. Recovers quickly with seated rest break, cues for pursed lip breathing, and return to 3L O2 via [place].  Deficits limit the pt's ability to safely and independently perform ADL's, transfer, and ambulate. Pt will benefit from acute skilled PT services to address deficits for return to baseline function. Pt will benefit from post acute therapy services to address deficits, as well as 3in1 for energy conservation and safety with ADL's at DC.         If plan is discharge home, recommend the following: A little help with bathing/dressing/bathroom;Assistance with cooking/housework;Help with stairs or ramp for entrance;Assist for transportation      Equipment Recommendations BSC/3in1     Functional Status Assessment Patient has had a recent decline in their functional status and demonstrates the ability to make significant improvements in function in a reasonable and predictable amount of time.     Precautions / Restrictions Precautions Precautions: Fall Precaution/Restrictions Comments: Moderate Fall Restrictions Other Position/Activity Restrictions: DNR/Limited, NSCLC      Mobility  Bed Mobility Overal bed mobility: Modified Independent             General bed mobility comments: perform supine<>sit with HOB elevated and flat, use of BUE for support    Transfers Overall transfer level: Needs assistance Equipment used: Rolling walker (2 wheels) Transfers: Sit to/from Stand Sit to Stand: Supervision           General transfer comment: Supervision for safety to perform STS from EOB with RW; verbal cues for safety, sequencing, and hand placement. Demonstrates "good" eccentric lowering with proper hand placement.    Ambulation/Gait Ambulation/Gait assistance: Supervision Gait Distance (Feet): 20 Feet Assistive device: Rolling walker (2 wheels)         General Gait Details: Supervision for safety to ambulate short distance in room. Demonstrates slowed cadence, decreased step length/foot clearance bilaterally. Walk test performed: initiated at 94% on RA and dropped to 82% on RA after 6ft ambulation; able to recover quickly with seated rest break, cues for pursed lip breathing, and placed back on 3L O2 via [place].     Balance Overall balance assessment: Mild deficits observed, not formally tested  Pertinent Vitals/Pain Pain Assessment Pain Assessment: 0-10 Pain Score: 6  Pain Location: R 5th rib pain Pain Descriptors / Indicators: Aching, Dull Pain Intervention(s): Monitored during session    Home Living Family/patient expects to be discharged to::  Private residence Living Arrangements: Spouse/significant other Available Help at Discharge: Available 24 hours/day;Family Type of Home: House Home Access: Stairs to enter Entrance Stairs-Rails: None Entrance Stairs-Number of Steps: 1 Alternate Level Stairs-Number of Steps: flight of stairs to 2nd level Home Layout: Two level;Able to live on main level with bedroom/bathroom Home Equipment: Rollator (4 wheels)      Prior Function Prior Level of Function : Independent/Modified Independent             Mobility Comments: Independent, no AD. ADLs Comments: Active, plays golf, totally independent with ADL/IADL.     Extremity/Trunk Assessment   Upper Extremity Assessment Upper Extremity Assessment: Overall WFL for tasks assessed    Lower Extremity Assessment Lower Extremity Assessment: Overall WFL for tasks assessed    Cervical / Trunk Assessment Cervical / Trunk Assessment: Normal  Communication   Communication Communication: No apparent difficulties    Cognition Arousal: Alert Behavior During Therapy: WFL for tasks assessed/performed   PT - Cognitive impairments: No apparent impairments                         Following commands: Intact       Cueing Cueing Techniques: Verbal cues     General Comments General comments (skin integrity, edema, etc.): Walk test performed: initiated at 94% on RA and dropped to 82% on RA after 31ft ambulation; able to recover quickly with seated rest break, cues for pursed lip breathing, and placed back on 3L O2 via [place].    Exercises Other Exercises Other Exercises: Pt and spouse educated re: PT role/POC, DC/DME recommendations, safety with functional mobility, energy conservation/pacing, walk test, O2 needs, pursed lip breathing, and energy expenditure with rollator. They verbalized understanding.   Assessment/Plan    PT Assessment Patient needs continued PT services  PT Problem List Decreased activity tolerance;Decreased  balance;Decreased mobility;Cardiopulmonary status limiting activity       PT Treatment Interventions DME instruction;Gait training;Stair training;Functional mobility training;Therapeutic activities;Therapeutic exercise;Balance training;Neuromuscular re-education    PT Goals (Current goals can be found in the Care Plan section)  Acute Rehab PT Goals Patient Stated Goal: "go home" PT Goal Formulation: With patient/family Time For Goal Achievement: 01/27/24 Potential to Achieve Goals: Good    Frequency Min 1X/week        AM-PAC PT "6 Clicks" Mobility  Outcome Measure Help needed turning from your back to your side while in a flat bed without using bedrails?: None Help needed moving from lying on your back to sitting on the side of a flat bed without using bedrails?: None Help needed moving to and from a bed to a chair (including a wheelchair)?: A Little Help needed standing up from a chair using your arms (e.g., wheelchair or bedside chair)?: A Little Help needed to walk in hospital room?: A Little Help needed climbing 3-5 steps with a railing? : A Little 6 Click Score: 20    End of Session Equipment Utilized During Treatment: Gait belt;Oxygen (3L) Activity Tolerance: Patient tolerated treatment well Patient left: in bed;with family/visitor present;with call bell/phone within reach (no bed alarm needed; pt safe for ambulation in room with spouse) Nurse Communication: Mobility status PT Visit Diagnosis: Unsteadiness on feet (R26.81);History of falling (Z91.81)    Time:  1610-9604 PT Time Calculation (min) (ACUTE ONLY): 24 min   Charges:   PT Evaluation $PT Eval Low Complexity: 1 Low PT Treatments $Therapeutic Activity: 8-22 mins PT General Charges $$ ACUTE PT VISIT: 1 Visit        Vira Blanco, PT, DPT 12:16 PM,01/13/24 Physical Therapist - [facility]

## 2024-01-13 NOTE — Plan of Care (Signed)

## 2024-01-13 NOTE — Progress Notes (Signed)
 [facility]  - [place] at [facility]   PATIENT NAME: Joe Morton    MR#:  [id number]  DATE OF BIRTH:  03-23-1951  SUBJECTIVE:      VITALS:  Blood pressure 110/75, pulse (!) 101, temperature 97.7 F (36.5 C), temperature source Oral, resp. rate 20, height 5\' 8"  (1.727 m), weight 67.1 kg, SpO2 94%.  PHYSICAL EXAMINATION:   GENERAL:  73 y.o.-year-old patient with no acute distress. Appears ill, frail thin LUNGS: [place] breath sounds bilaterally, no wheezing CARDIOVASCULAR: S1, S2 normal. tachycardia ABDOMEN: Soft, nontender, nondistended.  EXTREMITIES: No  edema b/l.    NEUROLOGIC: nonfocal  patient is alert and awake, weak SKIN: No obvious rash, lesion, or ulcer.   LABORATORY PANEL:  CBC Recent Labs  Lab 01/12/24 1420  WBC 13.0*  HGB 16.2  HCT 48.5  PLT 63*    Chemistries  Recent Labs  Lab 01/12/24 1420 01/13/24 0439  NA 138 140  K 4.1 3.8  CL 101 109  CO2 21* 19*  GLUCOSE 108* 96  BUN 23 20  CREATININE 1.31* 1.04  CALCIUM 10.4* 9.6  AST 41  --   ALT 23  --   ALKPHOS 1,322*  --   BILITOT 1.0  --     RADIOLOGY:  DG Chest 1 View Result Date: 01/12/2024 CLINICAL DATA:  Pneumonia, known metastatic lung cancer EXAM: CHEST  1 VIEW COMPARISON:  01/09/2024 FINDINGS: Two frontal views of the chest demonstrate a stable cardiac silhouette. Hazy opacity within the left hemithorax consistent with the left upper lobe consolidation and underlying mass as identified on multiple prior CT and PET scans. Chronic elevation of the left hemidiaphragm. Stable left pleural effusion. Right chest is clear. No acute fractures. IMPRESSION: 1. Continued left upper lobe consolidation compatible with known left hilar mass and postobstructive changes as seen on multiple prior exams. 2. Chronic elevation of the left hemidiaphragm, with stable left pleural effusion. Electronically Signed   By: Sharlet Salina M.D.   On: 01/12/2024 16:27    Assessment and Plan Joe Morton is a 73  y.o. 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   CT chest and abdomen on February 18 1. No acute intrathoracic, intra-abdominal, intrapelvic traumatic injury. 2. No acute fracture or traumatic malalignment of the thoracic or lumbar spine. 3. Interval increase in small volume left pleural effusion. 4. Interval increase in left upper lobe consolidation and ground-glass airspace opacity with narrowing of the bronchial and surrounding a left hilar density consistent with known malignancy better evaluated on PET CT 12/08/2023. Superimposed infection not excluded. 5. Heterogeneous lytic appendicular and axial skeleton consistent with metastasis. 6. Aortic Atherosclerosis (ICD10-I70.0) and Emphysema (ICD10-J43.9).   Sepsis due to left upper lobe pneumonia/post obstructive left lung mass left pleural effusion -- came in with tachycardia, elevated white count, left upper lobe pneumonia -- IV Rocephin and Zithromax -- chest physiotherapy, flutter valve, incentive spirometer and nebulizer -- patient coughing up pink frothy sputum -- IR consulted for ultrasound-guided thoracentesis -- blood culture negative -- sputum culture pending  Left lung non small cell LL  -- patient on oral chemo drug which will hold -- discussed with Dr. Smith Robert -- patient follows with Dr. Cathie Hoops  Hypercalcemia -- came in with calcium of 11.1-- 10.6--- IV  fluids-- 9.6  Protein calorie malnutrition moderate -- patient has been followed by  dietitian at the cancer center  Generalized weakness with recent fall -- seen by PT. Will arrange home health PT  Right lower extremity DVT -- continue Xarelto    Procedures: Family communication :  wife at bedside Consults : IR CODE STATUS: DNR DVT Prophylaxis : Xarelto Level of care: Telemetry Medical Status is: Inpatient Remains inpatient appropriate because: IV antibiotics for sepsis due to pneumonia    TOTAL TIME TAKING CARE OF THIS PATIENT: 45 minutes.  >50% time spent on counselling and coordination of care  Note: This dictation was prepared with Dragon dictation along with smaller phrase technology. Any transcriptional errors that result from this process are unintentional.  Enedina Finner M.D    [facility]   CC: Primary care physician; Emilio Aspen, MD

## 2024-01-14 ENCOUNTER — Inpatient Hospital Stay: Payer: Medicare Other

## 2024-01-14 DIAGNOSIS — S2231XD Fracture of one rib, right side, subsequent encounter for fracture with routine healing: Secondary | ICD-10-CM | POA: Diagnosis not present

## 2024-01-14 DIAGNOSIS — A419 Sepsis, unspecified organism: Secondary | ICD-10-CM | POA: Diagnosis not present

## 2024-01-14 DIAGNOSIS — J189 Pneumonia, unspecified organism: Secondary | ICD-10-CM | POA: Diagnosis not present

## 2024-01-14 LAB — LACTATE DEHYDROGENASE, PLEURAL OR PERITONEAL FLUID: LD, Fluid: 155 U/L — ABNORMAL HIGH (ref 3–23)

## 2024-01-14 LAB — BODY FLUID CELL COUNT WITH DIFFERENTIAL
Eos, Fluid: 0 %
Lymphs, Fluid: 76 %
Monocyte-Macrophage-Serous Fluid: 6 %
Neutrophil Count, Fluid: 3 %
Other Cells, Fluid: 15 %
Total Nucleated Cell Count, Fluid: 507 uL

## 2024-01-14 LAB — PROTEIN, PLEURAL OR PERITONEAL FLUID: Total protein, fluid: 3 g/dL

## 2024-01-14 LAB — GLUCOSE, PLEURAL OR PERITONEAL FLUID: Glucose, Fluid: 120 mg/dL

## 2024-01-14 MED ORDER — ADULT MULTIVITAMIN W/MINERALS CH
1.0000 | ORAL_TABLET | Freq: Every day | ORAL | Status: DC
  Administered 2024-01-14 – 2024-01-16 (×3): 1 via ORAL
  Filled 2024-01-14 (×3): qty 1

## 2024-01-14 MED ORDER — MELATONIN 5 MG PO TABS
5.0000 mg | ORAL_TABLET | Freq: Once | ORAL | Status: AC
  Administered 2024-01-14: 5 mg via ORAL
  Filled 2024-01-14: qty 1

## 2024-01-14 MED ORDER — TRAZODONE HCL 50 MG PO TABS
25.0000 mg | ORAL_TABLET | Freq: Every evening | ORAL | Status: DC | PRN
  Administered 2024-01-14 – 2024-01-15 (×2): 25 mg via ORAL
  Filled 2024-01-14 (×2): qty 1

## 2024-01-14 MED ORDER — LIDOCAINE HCL (PF) 1 % IJ SOLN
10.0000 mL | Freq: Once | INTRAMUSCULAR | Status: AC
  Administered 2024-01-14: 10 mL via INTRADERMAL
  Filled 2024-01-14 (×3): qty 10

## 2024-01-14 MED ORDER — MIRTAZAPINE 15 MG PO TBDP
7.5000 mg | ORAL_TABLET | Freq: Every day | ORAL | Status: DC
  Administered 2024-01-14 – 2024-01-15 (×2): 7.5 mg via ORAL
  Filled 2024-01-14 (×2): qty 0.5

## 2024-01-14 NOTE — Plan of Care (Signed)

## 2024-01-14 NOTE — Progress Notes (Signed)
 Initial Nutrition Assessment  DOCUMENTATION CODES:   Not applicable  INTERVENTION:  Continue with current diet Multivitamin/minerals Carnation Breakfast Essentials TID, each packet mixed with 8 ounces of 2% milk provides 13 grams of protein and 260 calories.   NUTRITION DIAGNOSIS:   Increased nutrient needs related to cancer and cancer related treatments, chronic illness as evidenced by estimated needs.    GOAL:   Patient will meet greater than or equal to 90% of their needs    MONITOR:   PO intake  REASON FOR ASSESSMENT:   Consult Assessment of nutrition requirement/status  ASSESSMENT:    73 y.o. M, presented to ED after history of fall with complaint of rib injury.  Patient was seen at urgent care got chest x-ray and diagnosed right fifth rib fracture. 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 Review of chart revealed declined oral intake, weight loss, Oncology RD following although missed last appointment, wife was able to speak to RD though. RN reports that patient does not like the Ensures, but will provide if he should ask, discussed offering Boost Plus.  unable to confirm without NFPE  Admit weight: 67.1 kg Weight history: 01/12/24 67.1 kg  01/09/24 67.1 kg  01/08/24 67.1 kg  01/05/24 67.3 kg  12/28/23 69.7 kg  12/25/23 70.9 kg  12/21/23 72.6 kg  11/29/23 73.2 kg  09/17/18 69.4 kg  09/10/18 69.4 kg    Average Meal Intake: No current documentation.  Nutritionally Relevant Medications: Scheduled Meds:  azithromycin  500 mg Oral Daily   dronabinol  2.5 mg Oral QAC lunch   guaiFENesin  1,200 mg Oral BID   mirtazapine  7.5 mg Oral QHS    Rivaroxaban  15 mg Oral BID WC    PRN Meds:.acetaminophen, benzonatate, HYDROcodone-acetaminophen, HYDROmorphone (DILAUDID) injection, lactulose, ondansetron, traZODone  Labs Reviewed    NUTRITION - FOCUSED PHYSICAL EXAM:  Deferred   Diet Order:   Diet Order             Diet regular Room service appropriate? Yes; Fluid consistency: Thin  Diet effective now                   EDUCATION NEEDS:   Education needs have been addressed  Skin:  Skin Assessment: Reviewed RN Assessment  Last BM:  PTA  Height:   Ht Readings from Last 1 Encounters:  01/12/24 5\' 8"  (1.727 m)    Weight:   Wt Readings from Last 1 Encounters:  01/12/24 67.1 kg    Ideal Body Weight:     BMI:  Body mass index is 22.5 kg/m.  Estimated Nutritional Needs:   Kcal:  1761-6073 kcal  Protein:  90-110g  Fluid:  52ml/kcal    Jamelle Haring RDN, LDN Clinical Dietitian   If unable to reach, please contact "RD Inpatient" secure chat group between 8 am-4 pm daily"

## 2024-01-14 NOTE — Progress Notes (Signed)
 When this RN went to give pt his PRN melatonin, pt expressed his desire to be discharged immediately.  Pt stated that he feels well enough to go home and finish his recovery.  This RN explained that if the pt leaves against medical advice (AMA) then he would not be able to get any post discharge support, meaning medications or follow up appointments.  This RN also explained that if the pt were to take a turn for the worse, then he wold have to be admitted through the emergency dept all over again.  Pt agreeable to wait until the morning, but has a deep desire to be discharged on 01/14/2024.

## 2024-01-14 NOTE — Procedures (Signed)
  Procedure:  L thoracentesis Preprocedure diagnosis: The primary encounter diagnosis was Pneumonia of left upper lobe due to infectious organism. Diagnoses of Closed fracture of one rib of right side with routine healing, subsequent encounter, S/P thoracentesis, and Pleural effusion, left were also pertinent to this visit. Postprocedure diagnosis: same EBL:    minimal Complications:   none immediate  See full dictation in [employer].  Thora Lance MD Main # [phone] Pager  [phone] Mobile [phone]

## 2024-01-14 NOTE — Plan of Care (Signed)
  Problem: Activity: Goal: Risk for activity intolerance will decrease Outcome: Progressing   Problem: Nutrition: Goal: Adequate nutrition will be maintained Outcome: Progressing   Problem: Coping: Goal: Level of anxiety will decrease Outcome: Progressing   Problem: Elimination: Goal: Will not experience complications related to bowel motility Outcome: Progressing Goal: Will not experience complications related to urinary retention Outcome: Progressing   Problem: Pain Managment: Goal: General experience of comfort will improve and/or be controlled Outcome: Progressing   Problem: Safety: Goal: Ability to remain free from injury will improve Outcome: Progressing   Problem: Skin Integrity: Goal: Risk for impaired skin integrity will decrease Outcome: Progressing   Problem: Activity: Goal: Ability to tolerate increased activity will improve Outcome: Progressing   Problem: Clinical Measurements: Goal: Ability to maintain a body temperature in the normal range will improve Outcome: Progressing   Problem: Respiratory: Goal: Ability to maintain adequate ventilation will improve Outcome: Progressing U/S guided thoracentesis performed this shift Goal: Ability to maintain a clear airway will improve Outcome: Progressing   Problem: Education: Goal: Knowledge of General Education information will improve Description: Including pain rating scale, medication(s)/side effects and non-pharmacologic comfort measures Outcome: Not Progressing   Problem: Health Behavior/Discharge Planning: Goal: Ability to manage health-related needs will improve Outcome: Not Progressing   Problem: Clinical Measurements: Goal: Ability to maintain clinical measurements within normal limits will improve Outcome: Not Progressing Goal: Will remain free from infection Outcome: Not Progressing Goal: Diagnostic test results will improve Outcome: Not Progressing Goal: Respiratory complications will  improve Outcome: Not Progressing Goal: Cardiovascular complication will be avoided Outcome: Not Progressing

## 2024-01-14 NOTE — Progress Notes (Signed)
 [facility]  - [place] [facility]   PATIENT NAME: Joe Morton    MR#:  [id number]  DATE OF BIRTH:  31-May-1951  SUBJECTIVE:  daughter and wife at bedside. Appears patient was confused/delirious yesterday night. Per RN's note wanted to leave. Awake alert. Poor appetite. Family bringing food from home. No fever. No respiratory distress. Underwent ultrasound-guided thoracentesis.    VITALS:  Blood pressure 109/75, pulse (!) 107, temperature 98.8 F (37.1 C), resp. rate 16, height 5\' 8"  (1.727 m), weight 67.1 kg, SpO2 96%.  PHYSICAL EXAMINATION:   GENERAL:  73 y.o.-year-old patient with no acute distress. Appears ill, frail thin LUNGS: [place] breath sounds bilaterally, no wheezing CARDIOVASCULAR: S1, S2 normal. tachycardia ABDOMEN: Soft, nontender, nondistended.  EXTREMITIES: No  edema b/l.    NEUROLOGIC: nonfocal  patient is alert and awake, weak   LABORATORY PANEL:  CBC Recent Labs  Lab 01/12/24 1420  WBC 13.0*  HGB 16.2  HCT 48.5  PLT 63*    Chemistries  Recent Labs  Lab 01/12/24 1420 01/13/24 0439  NA 138 140  K 4.1 3.8  CL 101 109  CO2 21* 19*  GLUCOSE 108* 96  BUN 23 20  CREATININE 1.31* 1.04  CALCIUM 10.4* 9.6  AST 41  --   ALT 23  --   ALKPHOS 1,322*  --   BILITOT 1.0  --     RADIOLOGY:  US THORACENTESIS ASP PLEURAL SPACE W/IMG GUIDE Result Date: 01/14/2024 INDICATION: Pneumonia, metastatic lung carcinoma, left pleural effusion EXAM: ULTRASOUND GUIDED LEFT THORACENTESIS MEDICATIONS: Lidocaine 1% subcutaneous COMPLICATIONS: None immediate.  No pneumothorax on follow-up chest radiograph. PROCEDURE: An ultrasound guided thoracentesis was thoroughly discussed with the patient and questions answered. The benefits, risks, alternatives and complications were also discussed. The patient understands and wishes to proceed with the procedure. Written consent was obtained. Ultrasound was performed to localize and mark an adequate pocket of fluid in the  left chest. The area was then prepped and draped in the normal sterile fashion. 1% Lidocaine was used for local anesthesia. Under ultrasound guidance a 6 Fr Safe-T-Centesis catheter was introduced. Thoracentesis was performed. The catheter was removed and a dressing applied. FINDINGS: A total of approximately 500 mL of cloudy yellow fluid was removed. Samples were sent to the laboratory as requested by the clinical team. IMPRESSION: Successful ultrasound guided left thoracentesis yielding 500 mL of pleural fluid. Electronically Signed   By: Corlis Leak M.D.   On: 01/14/2024 11:24   DG Chest Port 1 View Result Date: 01/14/2024 CLINICAL DATA:  Pleural effusion EXAM: PORTABLE CHEST 1 VIEW COMPARISON:  CT 01/09/2024 FINDINGS: Normal cardiac silhouette. Dense LEFT upper lobe pneumonia again noted. Small LEFT effusion is decreased in volume. RIGHT lung relatively clear. No pneumothorax. IMPRESSION: 1. Persistent LEFT upper lobe pneumonia. 2. Improvement in LEFT pleural effusion. Electronically Signed   By: Genevive Bi M.D.   On: 01/14/2024 10:36   CT HEAD WO CONTRAST ( ) Result Date: 01/14/2024 CLINICAL DATA:  73 year old male with altered mental status. Lung cancer. Recent MRI evidence of embolic appearing cerebral and cerebellar infarcts. Skeletal metastases. EXAM: CT HEAD WITHOUT CONTRAST TECHNIQUE: Contiguous axial images were obtained from the base of the skull through the vertex without intravenous contrast. RADIATION DOSE REDUCTION: This exam was performed according to the departmental dose-optimization program which includes automated exposure control, adjustment of the mA and/or kV according to patient size and/or use of iterative reconstruction technique. COMPARISON:  Recent brain MRI 12/30/2023.  Head CT 01/02/2024.  FINDINGS: Brain: Normal background cerebral volume for age. No ventriculomegaly. Cavum septum pellucidum. No midline shift. New wedge-shaped left cerebellar infarcts/edema on series 2,  image 9, was negative on the MRI earlier this month. This is AICA territory. No associated hemorrhage or mass effect. Elsewhere gray-white differentiation appears stable, with regression of left middle frontal gyrus vasogenic edema (series 2, image 25). No other areas of new or increased edema. No acute intracranial hemorrhage identified. No intracranial mass effect or discrete mass lesion. Vascular: Calcified atherosclerosis at the skull base. No suspicious intracranial vascular hyperdensity. Skull: Widely scattered calvarium lucent lesions, most numerous at the vertex. Heterogeneous clivus. Heterogeneous visible upper cervical vertebrae. Known bone metastases. No acute fracture identified. Sinuses/Orbits: Visualized paranasal sinuses and mastoids are stable and well aerated. Other: No acute orbit or scalp soft tissue finding. IMPRESSION: 1. Patchy new Left cerebellar AICA infarct since 01/02/2024. No associated hemorrhage or mass effect. 2. Otherwise stable, evolved small infarcts demonstrated on MRI 12/30/2023. 3. 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 Electronically Signed   By: Odessa Fleming M.D.   On: 01/14/2024 06:56   DG Chest 1 View Result Date: 01/12/2024 CLINICAL DATA:  Pneumonia, known metastatic lung cancer EXAM: CHEST  1 VIEW COMPARISON:  01/09/2024 FINDINGS: Two frontal views of the chest demonstrate a stable cardiac silhouette. Hazy opacity within the left hemithorax consistent with the left upper lobe consolidation and underlying mass as identified on multiple prior CT and PET scans. Chronic elevation of the left hemidiaphragm. Stable left pleural effusion. Right chest is clear. No acute fractures. IMPRESSION: 1. Continued left upper lobe consolidation compatible with known left hilar mass and postobstructive changes as seen on multiple prior exams. 2. Chronic elevation of the left hemidiaphragm, with stable left pleural effusion. Electronically Signed   By: Sharlet Salina M.D.   On:  01/12/2024 16:27    Assessment and Plan Joe Morton is a 73 y.o. 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   CT chest and abdomen on February 18 1. No acute intrathoracic, intra-abdominal, intrapelvic traumatic injury. 2. No acute fracture or traumatic malalignment of the thoracic or lumbar spine. 3. Interval increase in small volume left pleural effusion. 4. Interval increase in left upper lobe consolidation and ground-glass airspace opacity with narrowing of the bronchial and surrounding a left hilar density consistent with known malignancy better evaluated on PET CT 12/08/2023. Superimposed infection not excluded. 5. Heterogeneous lytic appendicular and axial skeleton consistent with metastasis. 6. Aortic Atherosclerosis (ICD10-I70.0) and Emphysema (ICD10-J43.9).   Sepsis due to left upper lobe pneumonia/post obstructive left lung mass left pleural effusion -- came in with tachycardia, elevated white count, left upper lobe pneumonia -- IV Rocephin and Zithromax -- chest physiotherapy, flutter valve, incentive spirometer and nebulizer -- patient coughing up pink frothy sputum -- IR consulted for ultrasound-guided thoracentesis -- blood culture negative -- sputum culture no organisms seen. Squamous cell epithelial cells present  Left lung non small cell LL with bony metastasis -- patient on oral chemo drug which will hold -- discussed with Dr. Smith Robert -- patient follows with Dr. Cathie Hoops  Hypercalcemia -- came in with calcium of 11.1-- 10.6--- IV fluids-- 9.6  Protein calorie malnutrition moderate -- patient has been followed by  dietitian at the cancer center  Generalized weakness with recent fall --  seen by PT. Will arrange home health PT  Right lower extremity DVT -- continue Xarelto  Confusion/hallucination/insomnia -- CT had done last night showed stroke which was similar to previous CT head. Extensive skeletal mental stasis noted -- will resume Remeron -- PO RN trazodone  Long term prognosis poor  Family communication : wife /dterat bedside Consults : IR CODE STATUS: DNR DVT Prophylaxis : Xarelto Level of care: Telemetry Medical Status is: Inpatient Remains inpatient appropriate because: IV antibiotics for sepsis due to pneumonia    TOTAL TIME TAKING CARE OF THIS PATIENT: 45 minutes.  >50% time spent on counselling and coordination of care  Note: This dictation was prepared with Dragon dictation along with smaller phrase technology. Any transcriptional errors that result from this process are unintentional.  Enedina Finner M.D    [facility]   CC: Primary care physician; Emilio Aspen, MD

## 2024-01-14 NOTE — Progress Notes (Signed)
       CROSS COVER NOTE  NAME: Joe Morton MRN: [id number] DOB : 08-13-1951 ATTENDING PHYSICIAN: Enedina Finner, MD    Date of Service   01/14/2024   HPI/Events of Note   Patient admitted post fall at home for CAP. Has hx sig for metastatic stage IV non smell cell lung cancer and DVT on xarelto.  Nurse reports significant alteration in metal status this am with hallucinations present  Interventions   Assessment/Plan: Stat head CT r/o bleed        Donnie Mesa NP [facility] Cross Cover 7pm-7am - check amion for availability Pager [phone]

## 2024-01-14 NOTE — Progress Notes (Signed)
 Visited with pt wife and daughter, pt was not in room. Wife expressed desire to complete AD paperwork-she expressed some frustrations as they requested it on Friday and had not had anyone from chaplain services come by. I apologized greatly. Explained some of the challenges we face with getting paperwork notarized on weekends, and assured her someone should have been by sooner-I called the numbers of multiple on call notaries and left messages. Informed her we would do our best to get it done today but if not today I would send someone first thing Monday AM. Wife got tearful-expressing sadness over how quickly her husband has declined since diagnosis, and the grief she already feel. Appreciative of chaplain visit and support

## 2024-01-15 ENCOUNTER — Encounter: Payer: Medicare Other | Admitting: Hospice and Palliative Medicine

## 2024-01-15 ENCOUNTER — Other Ambulatory Visit (HOSPITAL_COMMUNITY): Payer: Self-pay

## 2024-01-15 ENCOUNTER — Ambulatory Visit: Payer: Medicare Other | Admitting: Oncology

## 2024-01-15 DIAGNOSIS — Z9889 Other specified postprocedural states: Secondary | ICD-10-CM | POA: Diagnosis not present

## 2024-01-15 DIAGNOSIS — J90 Pleural effusion, not elsewhere classified: Secondary | ICD-10-CM | POA: Diagnosis not present

## 2024-01-15 DIAGNOSIS — C3492 Malignant neoplasm of unspecified part of left bronchus or lung: Secondary | ICD-10-CM

## 2024-01-15 DIAGNOSIS — Z7189 Other specified counseling: Secondary | ICD-10-CM | POA: Diagnosis not present

## 2024-01-15 DIAGNOSIS — S2231XD Fracture of one rib, right side, subsequent encounter for fracture with routine healing: Secondary | ICD-10-CM | POA: Diagnosis not present

## 2024-01-15 DIAGNOSIS — Z515 Encounter for palliative care: Secondary | ICD-10-CM | POA: Diagnosis not present

## 2024-01-15 DIAGNOSIS — A419 Sepsis, unspecified organism: Secondary | ICD-10-CM | POA: Diagnosis not present

## 2024-01-15 DIAGNOSIS — J189 Pneumonia, unspecified organism: Secondary | ICD-10-CM | POA: Diagnosis not present

## 2024-01-15 LAB — LEGIONELLA PNEUMOPHILA SEROGP 1 UR AG: L. pneumophila Serogp 1 Ur Ag: NEGATIVE

## 2024-01-15 LAB — PATHOLOGIST SMEAR REVIEW

## 2024-01-15 MED ORDER — MORPHINE SULFATE (CONCENTRATE) 10 MG /0.5 ML PO SOLN: 5.0000 mg | ORAL | Status: DC | PRN

## 2024-01-15 MED ORDER — HALOPERIDOL LACTATE 5 MG/ML IJ SOLN
2.0000 mg | Freq: Once | INTRAMUSCULAR | Status: AC
  Administered 2024-01-15: 2 mg via INTRAVENOUS
  Filled 2024-01-15: qty 1

## 2024-01-15 MED ORDER — ENSURE ENLIVE PO LIQD: 237.0000 mL | Freq: Two times a day (BID) | ORAL | Status: DC

## 2024-01-15 MED ORDER — OSIMERTINIB MESYLATE 80 MG PO TABS: 80.0000 mg | ORAL_TABLET | Freq: Every day | ORAL | Status: DC

## 2024-01-15 NOTE — Progress Notes (Signed)
 Physical Therapy Treatment Patient Details Name: Joe Morton MRN: [id number] DOB: May 20, 1951 Today's Date: 01/15/2024   History of Present Illness Pt admitted to [facility] on 01/12/24 under observation for c/o loss of appetite and weakness secondary to recent PNA diagnosis. Recent admission 2/18 for mechanical fall resulting in R 5th rib fx. 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    PT Comments  Pt was pleasant and motivated to participate during the session and put forth good effort throughout. Pt required no physical assistance during the session and presented with good control and stability with transfers and gait training with a rollator.  Pt found on 2LO2/min with SpO2 96% and with MD requesting O2 trial on room air.  Pt's SpO2 dropped to a low of 94-95% at rest on room air and to a low of 91% during graded ambulation distances per below.  Pt's SpO2 increased after amb back to 94-95% quickly upon returning to sitting on room air after gait.  Pt left on room air with nursing notified.  Pt reported no adverse symptoms during the session with HR WNL throughout. Pt will benefit from continued PT services upon discharge to safely address deficits listed in patient problem list for decreased caregiver assistance and eventual return to PLOF.       If plan is discharge home, recommend the following: A little help with bathing/dressing/bathroom;Assistance with cooking/housework;Help with stairs or ramp for entrance;Assist for transportation   Can travel by private vehicle        Equipment Recommendations  BSC/3in1    Recommendations for Other Services       Precautions / Restrictions Precautions Precautions: Fall Precaution/Restrictions  Comments: Moderate Fall Restrictions Weight Bearing Restrictions Per Provider Order: No Other Position/Activity Restrictions: DNR/Limited, NSCLC     Mobility  Bed Mobility Overal bed mobility: Modified Independent             General bed mobility comments: Min extra time and effort only    Transfers Overall transfer level: Needs assistance Equipment used: Rollator (4 wheels) Transfers: Sit to/from Stand Sit to Stand: Supervision           General transfer comment: Cues/training for proper sequencing transfers using a rollator    Ambulation/Gait Ambulation/Gait assistance: Supervision Gait Distance (Feet): 30 Feet Assistive device: Rollator (4 wheels) Gait Pattern/deviations: Step-through pattern, Decreased step length - right, Decreased step length - left Gait velocity: decreased     General Gait Details: Moderately reduced cadence but steady with no overt LOB with the rollator and with fair to good carryover of proper sequencing   Stairs             Wheelchair Mobility     Tilt Bed    Modified Rankin (Stroke Patients Only)       Balance Overall balance assessment: Needs assistance   Sitting balance-Leahy Scale: Normal     Standing balance support: Bilateral upper extremity supported, During functional activity Standing balance-Leahy Scale: Good                              Communication Communication Communication: No apparent difficulties  Cognition Arousal: Alert Behavior During Therapy: WFL for tasks assessed/performed   PT - Cognitive impairments: No apparent impairments  Following commands: Intact      Cueing Cueing Techniques: Verbal cues  Exercises Other Exercises Other Exercises: Rollator use training with pt and spouse    General Comments        Pertinent Vitals/Pain Pain Assessment Pain Assessment: No/denies pain    Home Living                           Prior Function            PT Goals (current goals can now be found in the care plan section) Progress towards PT goals: Progressing toward goals    Frequency    Min 1X/week      PT Plan      Co-evaluation              AM-PAC PT "6 Clicks" Mobility   Outcome Measure  Help needed turning from your back to your side while in a flat bed without using bedrails?: None Help needed moving from lying on your back to sitting on the side of a flat bed without using bedrails?: None Help needed moving to and from a bed to a chair (including a wheelchair)?: A Little Help needed standing up from a chair using your arms (e.g., wheelchair or bedside chair)?: A Little Help needed to walk in hospital room?: A Little Help needed climbing 3-5 steps with a railing? : A Little 6 Click Score: 20    End of Session Equipment Utilized During Treatment: Gait belt Activity Tolerance: Patient tolerated treatment well Patient left: in chair;with call bell/phone within reach;with family/visitor present Nurse Communication: Mobility status;Other (comment) (Per nursing pt has sitter, does not need chair alarm) PT Visit Diagnosis: Unsteadiness on feet (R26.81);History of falling (Z91.81)     Time: 1131-1203 PT Time Calculation (min) (ACUTE ONLY): 32 min  Charges:    $Gait Training: 8-22 mins $Therapeutic Activity: 8-22 mins PT General Charges $$ ACUTE PT VISIT: 1 Visit                     D. Scott Elena Cothern PT, DPT 01/15/24, 1:51 PM

## 2024-01-15 NOTE — Consult Note (Signed)
 Palliative Medicine [facility] at [facility] [phone] [phone]   Name: Joe Morton Date: 01/15/2024 MRN: [id number]  DOB: 11-Jun-1951  Patient Care Team: Emilio Aspen, MD as PCP - General (Internal Medicine) Rickard Patience, MD as Consulting Physician (Oncology) Glory Buff, RN as Oncology Nurse Navigator    REASON FOR CONSULTATION: Joe Morton is a 73 y.o. 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  DVT on Xarelto, patient was initially unsure if he desired cancer treatment but ultimately agreed to trial of Tagrisso.  Patient was admitted to hospital on 01/12/2024 with pneumonia.  Palliative care consulted to address goals.  SOCIAL HISTORY:     reports that he quit smoking about 44 years ago. His smoking use included cigarettes. He has never used smokeless tobacco. He reports that he does not currently use alcohol after a past usage of about 6.0 standard drinks of alcohol per week. He reports that he does not use drugs.  Patient lives at home with his wife of 52 years.  Has a daughter and granddaughter in [place].  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  ADVANCE DIRECTIVES:  Completed   CODE STATUS: DNR/DNI (MOST form signed on 01/05/2024)  PAST MEDICAL HISTORY: Past Medical History:  Diagnosis Date   Emphysema lung (HCC)    Hypertension    Non-small cell carcinoma of lung (HCC)     PAST SURGICAL HISTORY:  Past Surgical History:  Procedure Laterality Date   BRONCHIAL BIOPSY  12/21/2023   Procedure: BRONCHIAL BIOPSIES;  Surgeon: [name], MD;  Location: [facility];  Service: Pulmonary;;   BRONCHIAL NEEDLE ASPIRATION BIOPSY  12/21/2023   Procedure: BRONCHIAL NEEDLE ASPIRATION BIOPSIES;  Surgeon: [name], MD;  Location: [facility];  Service: Pulmonary;;   BRONCHIAL WASHINGS   12/21/2023   Procedure: BRONCHIAL WASHINGS;  Surgeon: [name], MD;  Location: [facility];  Service: Pulmonary;;   COLONOSCOPY     ENDOBRONCHIAL ULTRASOUND Bilateral 12/21/2023   Procedure: ENDOBRONCHIAL ULTRASOUND;  Surgeon: [name], MD;  Location: [facility];  Service: Pulmonary;  Laterality: Bilateral;   FINE NEEDLE ASPIRATION  12/21/2023   Procedure: FINE NEEDLE ASPIRATION (FNA) LINEAR;  Surgeon: [name], MD;  Location: [facility];  Service: Pulmonary;;   INGUINAL HERNIA REPAIR Left 09/17/2018   Procedure: OPEN LEFT INGUINAL HERNIA REPAIR WITH MESH;  Surgeon: Jimmye Norman, MD;  Location: [facility] OR;  Service: General;  Laterality: Left;  GENERAL AND LMA   INSERTION OF MESH Left 09/17/2018   Procedure: INSERTION OF MESH;  Surgeon: Jimmye Norman, MD;  Location: [facility] OR;  Service: General;  Laterality: Left;  GENERAL AND LMA    HEMATOLOGY/ONCOLOGY HISTORY:  Oncology History  Malignant neoplasm of upper lobe of left lung (HCC)  11/11/2023 Imaging   CT angio chest aorta w contrast  1. 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 Recommend annual imaging followup by CTA or MRA. This recommendation follows 2010 ACCF/AHA/AATS/ACR/ASA/SCA/SCAI/SIR/STS/SVM Guidelines for the Diagnosis and Management of Patients with Thoracic Aortic Disease. Circulation. 2010; 121: A213-Y865. Aortic aneurysm NOS (ICD10-I71.9) 2. Stable tortuosity of the aortic arch. 3. Significant emphysematous lung disease with diffuse perihilar bronchial thickening bilaterally. 4. There is an area of spiculation at a convergence point of posterior left upper lobe bronchi over a region measuring roughly 1.9 x 1.3 x  2.0 cm. This is also associated with adjacent nodularity and thickening of the adjacent major fissure over several cm. This was not present in 2019. Although this may be secondary to inflammation/infection, malignancy is  not excluded and further follow-up and workup may be indicated including short-term interval follow-up CT, possible PET scan and consideration of bronchoscopy targeted to this region. Referral to Pulmonology for follow-up is recommended. 5. Numerous small scattered mediastinal lymph nodes are present. The largest measures roughly 8 mm in short axis at the level of the AP window. 6. Probable hepatic steatosis.    12/16/2023 Imaging   CT super D chest wo contrast  1. Progressive soft tissue fullness in the left hilum and suprahilar region, encasing the bronchi to the apical and lingular segments of the left upper lobe. This soft tissue is confluent with a 6.7 x 3.5 cm masslike consolidative opacity in the posterior left upper lobe, new in the interval. Imaging features raise concern for neoplasm with volume loss in the posterior and anteromedial left upper lobe. 2. New small left pleural effusion. 3. Waxing and waning of small pulmonary nodules in the right lung. Continued close attention on follow-up recommended. 4. Aortic Atherosclerosis (ICD10-I70.0) and Emphysema (ICD10-J43.9   12/16/2023 Imaging   PET scan showed  1. Hypermetabolic left upper lobe pulmonary mass invading the left hilum consistent with primary lung neoplasm. 2. Hypermetabolic mediastinal and left hilar lymphadenopathy consistent with metastatic disease. 3. Diffuse scattered hypermetabolic bone lesions consistent with widespread metastatic bone disease. 4. No findings for metastatic disease involving the abdomen/pelvis. 5. Small left pleural effusion but no hypermetabolic pleural lesions. 6. Aortic atherosclerosis.   Aortic Atherosclerosis (ICD10-I70.0).   12/21/2023 Initial Diagnosis   Malignant neoplasm of upper lobe of left lung (HCC)  Biopsy via bronchoscopy showed  A. LUNG, LUL, FINE NEEDLE ASPIRATION  BIOPSY:  - Non-small cell carcinoma , TTF 1 positive, p40 and cytokeratin 5/6 negative, consistent  with adenocarcinoma.   B. LYMPH NODE, STATION 11R INFERIOR, FINE NEEDLE ASPIRATION:  - Rare atypical cells suspicious for carcinoma   C. LYMPH NODE, STATION 7, FINE NEEDLE ASPIRATION:  - Non-small cell carcinoma   D. LUNG, LUL, LAVAGE:  FINAL MICROSCOPIC DIAGNOSIS:  - No malignant cells identified  - Benign bronchial cells and pulmonary macrophages   TPS 2, CPS 20 NGS showed EGFR [id number] [19 inframe del ]      12/25/2023 Cancer Staging   Staging form: Lung, AJCC V9 - Clinical: Stage IVB (cT3, cN2, cM1c1) - Signed by Rickard Patience, MD on 12/25/2023 Stage prefix: Initial diagnosis   12/28/2023 Imaging   [place] right lower extremity showed  Acute RIGHT lower extremity DVT within the popliteal and calf veins.     12/30/2023 Imaging   MRI brain w wo contrast  1. 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 2. Additional small infarcts involving the more superior left frontal lobe, right corona radiata, left basal ganglia and bilateral cerebellum. This suggests a central source. 3. 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 No pathologic fractures are present.   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 Etiology of stroke is favored to be paradoxical embolism in the setting of known DVT (just diagnosed, had not started eliquis before MRI showed acute infarcts), although cardioembolic source cannot be excluded. TTE showed no e/o intracardiac clot  Neurology recommend outpatient follow up with cardiology for cardiac monitor Patient was discharged on Xarelto      ALLERGIES:  has no known allergies.  MEDICATIONS:  Current Facility-Administered Medications  Medication Dose Route Frequency Provider Last Rate Last Admin   acetaminophen (TYLENOL) tablet 500 mg  500 mg Oral  Q6H PRN Mikey College T, MD   500 mg at 01/15/24 0914   azithromycin (ZITHROMAX) tablet 500 mg  500 mg Oral Daily Tressie Ellis, RPH   500 mg at 01/15/24 0913   benzonatate (TESSALON) capsule 100 mg  100 mg Oral TID PRN Mikey College T, MD   100 mg at 01/15/24 0913   budesonide (PULMICORT) nebulizer solution 0.25 mg  0.25 mg Nebulization BID Mikey College T, MD   0.25 mg at 01/15/24 0805   cefTRIAXone (ROCEPHIN) 1 g in sodium chloride 0.9 % 100 mL IVPB  1 g Intravenous Q24H Mikey College T, MD 200 mL/hr at 01/14/24 2204 1 g at 01/14/24 2204   dronabinol (MARINOL) capsule 2.5 mg  2.5 mg Oral QAC lunch Mikey College T, MD   2.5 mg at 01/15/24 1142   feeding supplement (ENSURE ENLIVE / ENSURE PLUS) liquid 237 mL  237 mL Oral BID BM Enedina Finner, MD       guaiFENesin (MUCINEX) 12 hr tablet 1,200 mg  1,200 mg Oral BID Mikey College T, MD   1,200 mg at 01/15/24 0913   HYDROmorphone (DILAUDID) injection 0.5 mg  0.5 mg Intravenous Q4H PRN Mikey College T, MD   0.5 mg at 01/13/24 0435   ipratropium-albuterol (DUONEB) 0.5-2.5 (3) MG/3ML nebulizer solution 3 mL  3 mL Nebulization TID Mikey College T, MD   3 mL at 01/15/24 0805   lactulose (CHRONULAC) 10 GM/15ML solution 10-20 g  10-20 g Oral BID PRN Emeline General, MD       lidocaine (LIDODERM) 5 % 1 patch  1 patch Transdermal Q24H Enedina Finner, MD   1 patch at 01/14/24 2158   mirtazapine (REMERON SOL-TAB) disintegrating tablet 7.5 mg  7.5 mg Oral QHS Enedina Finner, MD   7.5 mg at 01/14/24 2154   morphine CONCENTRATE 10 mg / 0.5 ml oral solution 5 mg  5 mg Oral Q2H PRN Kaison Mcparland, Daryl Eastern, NP       multivitamin with minerals tablet 1 tablet  1 tablet Oral Daily Enedina Finner, MD   1 tablet at 01/15/24 0913   ondansetron (ZOFRAN) tablet 8 mg  8 mg Oral Q8H PRN Emeline General, MD       Rivaroxaban Carlena Hurl) tablet 15 mg  15 mg Oral BID WC Mila Merry A, RPH   15 mg at 01/15/24 4540   Followed by   Melene Muller ON 01/24/2024] rivaroxaban (XARELTO) tablet 20 mg  20 mg Oral Q supper Mila Merry A, RPH       traZODone (DESYREL) tablet 25 mg  25 mg Oral QHS PRN Enedina Finner, MD   25 mg at 01/14/24 2154    VITAL SIGNS: BP 112/77 (BP Location: Left Arm)   Pulse 100   Temp 97.9 F (36.6 C) (Oral)   Resp 20   Ht 5\' 8"  (1.727 m)   Wt 148 lb (67.1 kg)   SpO2 98%   BMI 22.50 kg/m  Filed Weights   01/12/24 1419  Weight: 148 lb (67.1 kg)    Estimated body mass index is 22.5 kg/m as calculated from the following:   Height as of this encounter: 5\' 8"  (  1.727 m).   Weight as of this encounter: 148 lb (67.1 kg).  LABS: CBC:    Component Value Date/Time   WBC 13.0 (H) 01/12/2024 1420   HGB 16.2 01/12/2024 1420   HCT 48.5 01/12/2024 1420   PLT 63 (L) 01/12/2024 1420   MCV 92.7 01/12/2024 1420   NEUTROABS 9.8 (H) 01/12/2024 1420   LYMPHSABS 1.1 01/12/2024 1420   MONOABS 1.4 (H) 01/12/2024 1420   EOSABS 0.1 01/12/2024 1420   BASOSABS 0.1 01/12/2024 1420   Comprehensive Metabolic Panel:    Component Value Date/Time   NA 140 01/13/2024 0439   K 3.8 01/13/2024 0439   CL 109 01/13/2024 0439   CO2 19 (L) 01/13/2024 0439   BUN 20 01/13/2024 0439   CREATININE 1.04 01/13/2024 0439   GLUCOSE 96 01/13/2024 0439   CALCIUM 9.6 01/13/2024 0439   AST 41 01/12/2024 1420   ALT 23 01/12/2024 1420   ALKPHOS 1,322 (H) 01/12/2024 1420   BILITOT 1.0 01/12/2024 1420   PROT 7.2 01/12/2024 1420   ALBUMIN 3.3 (L) 01/12/2024 1420    RADIOGRAPHIC STUDIES: US THORACENTESIS ASP PLEURAL SPACE W/IMG GUIDE Result Date: 01/14/2024 INDICATION: Pneumonia, metastatic lung carcinoma, left pleural effusion EXAM: ULTRASOUND GUIDED LEFT THORACENTESIS MEDICATIONS: Lidocaine 1% subcutaneous COMPLICATIONS: None immediate.  No pneumothorax on follow-up chest radiograph. PROCEDURE: An ultrasound guided thoracentesis was thoroughly discussed with the patient and questions answered. The benefits, risks, alternatives and complications were also discussed. The patient understands and wishes to proceed with the  procedure. Written consent was obtained. Ultrasound was performed to localize and mark an adequate pocket of fluid in the left chest. The area was then prepped and draped in the normal sterile fashion. 1% Lidocaine was used for local anesthesia. Under ultrasound guidance a 6 Fr Safe-T-Centesis catheter was introduced. Thoracentesis was performed. The catheter was removed and a dressing applied. FINDINGS: A total of approximately 500 mL of cloudy yellow fluid was removed. Samples were sent to the laboratory as requested by the clinical team. IMPRESSION: Successful ultrasound guided left thoracentesis yielding 500 mL of pleural fluid. Electronically Signed   By: Corlis Leak M.D.   On: 01/14/2024 11:24   DG Chest Port 1 View Result Date: 01/14/2024 CLINICAL DATA:  Pleural effusion EXAM: PORTABLE CHEST 1 VIEW COMPARISON:  CT 01/09/2024 FINDINGS: Normal cardiac silhouette. Dense LEFT upper lobe pneumonia again noted. Small LEFT effusion is decreased in volume. RIGHT lung relatively clear. No pneumothorax. IMPRESSION: 1. Persistent LEFT upper lobe pneumonia. 2. Improvement in LEFT pleural effusion. Electronically Signed   By: Genevive Bi M.D.   On: 01/14/2024 10:36   CT HEAD WO CONTRAST ( ) Result Date: 01/14/2024 CLINICAL DATA:  73 year old male with altered mental status. Lung cancer. Recent MRI evidence of embolic appearing cerebral and cerebellar infarcts. Skeletal metastases. EXAM: CT HEAD WITHOUT CONTRAST TECHNIQUE: Contiguous axial images were obtained from the base of the skull through the vertex without intravenous contrast. RADIATION DOSE REDUCTION: This exam was performed according to the departmental dose-optimization program which includes automated exposure control, adjustment of the mA and/or kV according to patient size and/or use of iterative reconstruction technique. COMPARISON:  Recent brain MRI 12/30/2023.  Head CT 01/02/2024. FINDINGS: Brain: Normal background cerebral volume for age. No  ventriculomegaly. Cavum septum pellucidum. No midline shift. New wedge-shaped left cerebellar infarcts/edema on series 2, image 9, was negative on the MRI earlier this month. This is AICA territory. No associated hemorrhage or mass effect. Elsewhere gray-white differentiation appears stable, with regression of left  middle frontal gyrus vasogenic edema (series 2, image 25). No other areas of new or increased edema. No acute intracranial hemorrhage identified. No intracranial mass effect or discrete mass lesion. Vascular: Calcified atherosclerosis at the skull base. No suspicious intracranial vascular hyperdensity. Skull: Widely scattered calvarium lucent lesions, most numerous at the vertex. Heterogeneous clivus. Heterogeneous visible upper cervical vertebrae. Known bone metastases. No acute fracture identified. Sinuses/Orbits: Visualized paranasal sinuses and mastoids are stable and well aerated. Other: No acute orbit or scalp soft tissue finding. IMPRESSION: 1. Patchy new Left cerebellar AICA infarct since 01/02/2024. No associated hemorrhage or mass effect. 2. Otherwise stable, evolved small infarcts demonstrated on MRI 12/30/2023. 3. 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 Electronically Signed   By: Odessa Fleming M.D.   On: 01/14/2024 06:56   DG Chest 1 View Result Date: 01/12/2024 CLINICAL DATA:  Pneumonia, known metastatic lung cancer EXAM: CHEST  1 VIEW COMPARISON:  01/09/2024 FINDINGS: Two frontal views of the chest demonstrate a stable cardiac silhouette. Hazy opacity within the left hemithorax consistent with the left upper lobe consolidation and underlying mass as identified on multiple prior CT and PET scans. Chronic elevation of the left hemidiaphragm. Stable left pleural effusion. Right chest is clear. No acute fractures. IMPRESSION: 1. Continued left upper lobe consolidation compatible with known left hilar mass and postobstructive changes as seen on multiple prior exams. 2. Chronic  elevation of the left hemidiaphragm, with stable left pleural effusion. Electronically Signed   By: Sharlet Salina M.D.   On: 01/12/2024 16:27   CT CHEST ABDOMEN PELVIS W CONTRAST Result Date: 01/10/2024 CLINICAL DATA:  Chest trauma, blunt. right rib pain after falling this morning. EXAM: CT CHEST, ABDOMEN, AND PELVIS WITH CONTRAST TECHNIQUE: Multidetector CT imaging of the chest, abdomen and pelvis was performed following the standard protocol during bolus administration of intravenous contrast. RADIATION DOSE REDUCTION: This exam was performed according to the departmental dose-optimization program which includes automated exposure control, adjustment of the mA and/or kV according to patient size and/or use of iterative reconstruction technique. CONTRAST:  OMNIPAQUE IOHEXOL 300 MG/ML  SOLN COMPARISON:  PET CT 12/08/2023 FINDINGS: CHEST: Cardiovascular: No aortic injury. The thoracic aorta is normal in caliber. The heart is normal in size. Slightly increased in size trace pericardial effusion. Mediastinum/Nodes: No pneumomediastinum. No mediastinal hematoma. The esophagus is unremarkable. The thyroid is unremarkable. The central airways are patent. No mediastinal, hilar, or axillary lymphadenopathy. Lungs/Pleura: Centrilobular emphysematous changes. Interval increase in left upper lobe consolidation and ground-glass airspace opacity with narrowing of the bronchial and surrounding a left hilar density consistent with known malignancy better evaluated on PET CT 12/08/2023. No pulmonary nodule. No pulmonary mass. No pulmonary contusion or laceration. No pneumatocele formation. Interval increase in small volume left pleural effusion. No right pleural effusion. No pneumothorax. No hemothorax. Musculoskeletal/Chest wall: No chest wall mass. Old healed right rib fractures. No acute rib or sternal fracture. No spinal fracture. ABDOMEN / PELVIS: Hepatobiliary: Not enlarged. No focal lesion. No laceration or  subcapsular hematoma. The gallbladder is otherwise unremarkable with no radio-opaque gallstones. No biliary ductal dilatation. Pancreas: Normal pancreatic contour. No main pancreatic duct dilatation. Spleen: Not enlarged. No focal lesion. No laceration, subcapsular hematoma, or vascular injury. Adrenals/Urinary Tract: No nodularity bilaterally. Bilateral kidneys enhance symmetrically. No hydronephrosis. No contusion, laceration, or subcapsular hematoma. No injury to the vascular structures or collecting systems. No hydroureter. The urinary bladder is unremarkable. On delayed imaging, there is no urothelial wall thickening and there are no filling defects  in the opacified portions of the bilateral collecting systems or ureters. Stomach/Bowel: No small or large bowel wall thickening or dilatation. The appendix is unremarkable. Vasculature/Lymphatics: No abdominal aorta or iliac aneurysm. No active contrast extravasation or pseudoaneurysm. No abdominal, pelvic, inguinal lymphadenopathy. Reproductive: Normal. Other: No simple free fluid ascites. No pneumoperitoneum. No hemoperitoneum. No mesenteric hematoma identified. No organized fluid collection. Musculoskeletal: No significant soft tissue hematoma. No acute pelvic fracture. No spinal fracture. Heterogeneous lytic appendicular and axial skeleton consistent with metastasis. Multilevel degenerative changes of the spine. Ports and Devices: None. IMPRESSION: 1. No acute intrathoracic, intra-abdominal, intrapelvic traumatic injury. 2. No acute fracture or traumatic malalignment of the thoracic or lumbar spine. 3. Interval increase in small volume left pleural effusion. 4. Interval increase in left upper lobe consolidation and ground-glass airspace opacity with narrowing of the bronchial and surrounding a left hilar density consistent with known malignancy better evaluated on PET CT 12/08/2023. Superimposed infection not excluded. 5. Heterogeneous lytic appendicular and  axial skeleton consistent with metastasis. 6. Aortic Atherosclerosis (ICD10-I70.0) and Emphysema (ICD10-J43.9). Electronically Signed   By: Tish Frederickson M.D.   On: 01/10/2024 00:06   DG Ribs Unilateral W/Chest Right Result Date: 01/09/2024 CLINICAL DATA:  Recent fall with right-sided rib pain, initial encounter EXAM: RIGHT RIBS AND CHEST - 3+ VIEW COMPARISON:  12/21/2023 FINDINGS: Cardiac shadow is stable. Persistent left perihilar mass lesion with left upper lobe consolidation is noted and slightly worsened in the interval from the prior exam. Minimal left pleural effusion is noted. Right lung is clear. No pneumothorax is seen. Fractures involving the right seventh rib posteriorly and laterally are seen. No complicating factors are noted. IMPRESSION: Right seventh rib fracture without complicating factors. Electronically Signed   By: Alcide Clever M.D.   On: 01/09/2024 22:10   CT ANGIO HEAD NECK W WO CM Result Date: 01/02/2024 CLINICAL DATA:  Stroke/TIA, determine embolic source EXAM: CT ANGIOGRAPHY HEAD AND NECK WITH AND WITHOUT CONTRAST TECHNIQUE: Multidetector CT imaging of the head and neck was performed using the standard protocol during bolus administration of intravenous contrast. Multiplanar CT image reconstructions and MIPs were obtained to evaluate the vascular anatomy. Carotid stenosis measurements (when applicable) are obtained utilizing NASCET criteria, using the distal internal carotid diameter as the denominator. RADIATION DOSE REDUCTION: This exam was performed according to the departmental dose-optimization program which includes automated exposure control, adjustment of the mA and/or kV according to patient size and/or use of iterative reconstruction technique. CONTRAST:  75mL OMNIPAQUE IOHEXOL 350 MG/ML SOLN COMPARISON:  None Available. FINDINGS: CT HEAD FINDINGS Brain: Known acute infarcts better characterized on recent MRI. No progressive mass effect or acute hemorrhage. No midline  shift. No hydrocephalus. Skull: Known metastases better seen on recent MRI. Sinuses/orbits: No acute abnormality. CTA NECK FINDINGS Aortic arch: Great vessel origins are patent without significant stenosis. Right carotid system: Atherosclerosis at the carotid bifurcation without greater than 50% stenosis. Left carotid system: Atherosclerosis at the carotid bifurcation without greater than 50% stenosis. Vertebral arteries: Right-dominant. No evidence of dissection, stenosis (50% or greater), or occlusion. Skeleton: No acute abnormality on limited assessment. Other neck: No acute abnormality on limited assessment. Upper chest: Partially imaged left upper lobe consolidation. Partially imaged layering left greater than right pleural effusions. Review of the MIP images confirms the above findings CTA HEAD FINDINGS Anterior circulation: Bilateral intracranial ICAs, MCAs, and ACAs are patent without proximal hemodynamically significant stenosis. Posterior circulation: Bilateral intradural vertebral arteries, basilar artery and bilateral posterior cerebral arteries are patent without proximal  hemodynamically significant stenosis. Venous sinuses: As permitted by contrast timing, patent. Review of the MIP images confirms the above findings IMPRESSION: 1. No large vessel occlusion or proximal hemodynamically significant stenosis. 2. Partially imaged left upper lobe consolidation, compatible with pneumonia that may be post-obstructive given known mass seen on prior PET-CT. A CT of the chest could further characterize if clinically warranted. 3. Partially imaged layering left greater than right pleural effusions. Electronically Signed   By: Feliberto Harts M.D.   On: 01/02/2024 19:15   ECHOCARDIOGRAM COMPLETE Result Date: 01/02/2024    ECHOCARDIOGRAM REPORT   Patient Name:   Joe Morton [facility] Date of Exam: 01/02/2024 Medical Rec #:  [id number]   Height:       68.0 in Accession #:    [id number]  Weight:       153.6 lb Date of Birth:   01-10-1951    BSA:          1.827 m Patient Age:    73 years    BP:           94/70 mmHg Patient Gender: M           HR:           80 bpm. Exam Location:  [facility] Procedure: 2D Echo, Cardiac Doppler and Color Doppler Indications:     Stroke I63.9  History:         Patient has no prior history of Echocardiogram examinations.                  Stroke.  Sonographer:     Lucendia Herrlich RCS Referring Phys:  [id number] Charise Killian Diagnosing Phys: Yvonne Kendall MD IMPRESSIONS  1. Left ventricular ejection fraction, by estimation, is 55 to 60%. The left ventricle has normal function. The left ventricle has no regional wall motion abnormalities. Left ventricular diastolic parameters were normal.  2. Right ventricular systolic function is normal. The right ventricular size is normal. There is normal pulmonary artery systolic pressure.  3. Right atrial size was mildly dilated.  4. The mitral valve is normal in structure. Moderate mitral valve regurgitation. No evidence of mitral stenosis.  5. Tricuspid valve regurgitation is mild to moderate.  6. The aortic valve has an indeterminant number of cusps. Aortic valve regurgitation is trivial. No aortic stenosis is present.  7. Aortic dilatation noted. There is borderline dilatation of the aortic root, measuring 38 mm. There is mild dilatation of the ascending aorta, measuring 38 mm.  8. The inferior vena cava is normal in size with greater than 50% respiratory variability, suggesting right atrial pressure of 3 mmHg. FINDINGS  Left Ventricle: Left ventricular ejection fraction, by estimation, is 55 to 60%. The left ventricle has normal function. The left ventricle has no regional wall motion abnormalities. The left ventricular internal cavity size was normal in size. There is  no left ventricular hypertrophy. Left ventricular diastolic parameters were normal. Right Ventricle: The right ventricular size is normal. No increase in right ventricular wall thickness. Right  ventricular systolic function is normal. There is normal pulmonary artery systolic pressure. The tricuspid regurgitant velocity is 2.78 m/s, and  with an assumed right atrial pressure of 3 mmHg, the estimated right ventricular systolic pressure is 33.9 mmHg. Left Atrium: Left atrial size was normal in size. Right Atrium: Right atrial size was mildly dilated. Pericardium: Trivial pericardial effusion is present. Mitral Valve: The mitral valve is normal in structure. Mild mitral annular calcification. Moderate mitral valve regurgitation. No  evidence of mitral valve stenosis. Tricuspid Valve: The tricuspid valve is normal in structure. Tricuspid valve regurgitation is mild to moderate. Aortic Valve: The aortic valve has an indeterminant number of cusps. Aortic valve regurgitation is trivial. No aortic stenosis is present. Aortic valve peak gradient measures 11.0 mmHg. Pulmonic Valve: The pulmonic valve was not well visualized. Pulmonic valve regurgitation is mild. No evidence of pulmonic stenosis. Aorta: Aortic dilatation noted. There is borderline dilatation of the aortic root, measuring 38 mm. There is mild dilatation of the ascending aorta, measuring 38 mm. Pulmonary Artery: The pulmonary artery is not well seen. Venous: The inferior vena cava is normal in size with greater than 50% respiratory variability, suggesting right atrial pressure of 3 mmHg. IAS/Shunts: The interatrial septum was not well visualized.  LEFT VENTRICLE PLAX 2D LVIDd:         5.20 cm   Diastology LVIDs:         3.70 cm   LV e' medial:    9.14 cm/s LV PW:         0.90 cm   LV E/e' medial:  11.4 LV IVS:        0.90 cm   LV e' lateral:   12.80 cm/s LVOT diam:     2.20 cm   LV E/e' lateral: 8.1 LV SV:         78 LV SV Index:   42 LVOT Area:     3.80 cm  RIGHT VENTRICLE             IVC RV S prime:     23.20 cm/s  IVC diam: 1.50 cm TAPSE (M-mode): 3.1 cm LEFT ATRIUM             Index        RIGHT ATRIUM           Index LA diam:        3.50 cm 1.92  cm/m   RA Area:     21.40 cm LA Vol (A2C):   47.8 ml 26.17 ml/m  RA Volume:   73.90 ml  40.45 ml/m LA Vol (A4C):   31.0 ml 16.94 ml/m LA Biplane Vol: 36.6 ml 20.03 ml/m  AORTIC VALVE AV Area (Vmax): 2.66 cm AV Vmax:        166.00 cm/s AV Peak Grad:   11.0 mmHg LVOT Vmax:      116.00 cm/s LVOT Vmean:     76.600 cm/s LVOT VTI:       0.204 m  AORTA Ao Root diam: 3.80 cm Ao Asc diam:  3.80 cm MITRAL VALVE                TRICUSPID VALVE MV Area (PHT): 3.48 cm     TR Peak grad:   30.9 mmHg MV Decel Time: 218 msec     TR Vmax:        278.00 cm/s MR Peak grad: 78.8 mmHg MR Vmax:      443.75 cm/s   SHUNTS MV E velocity: 104.00 cm/s  Systemic VTI:  0.20 m MV A velocity: 88.60 cm/s   Systemic Diam: 2.20 cm MV E/A ratio:  1.17 Yvonne Kendall MD Electronically signed by Yvonne Kendall MD Signature Date/Time: 01/02/2024/6:29:56 PM    Final    MR Brain W Wo Contrast Result Date: 12/30/2023 CLINICAL DATA:  Non-small cell lung cancer, staging. EXAM: MRI HEAD WITHOUT AND WITH CONTRAST TECHNIQUE: Multiplanar, multiecho pulse sequences of the brain and surrounding structures were obtained  without and with intravenous contrast. CONTRAST:  7mL GADAVIST GADOBUTROL 1 MMOL/ML IV SOLN COMPARISON:  CT head without contrast 12/28/2023. 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 Additional punctate cortical infarcts are present just superior to the largest area within the pre frontal cortex. Three separate punctate white matter scratched at 3 separate punctate acute white matter infarcts are present in the right corona radiata. A punctate area of acute infarction is present in the left lentiform nucleus. A punctate acute cortical infarct is present the left occipital pole. A 5 mm acute nonhemorrhagic infarct is present in the inferior right cerebellum. A punctate infarct is present inferiorly in the left cerebellum. Vascular: Flow is present in the major intracranial  arteries. Skull and upper cervical spine: Craniocervical junction is normal. 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 No pathologic fractures are present. Sinuses/Orbits: The paranasal sinuses and mastoid air cells are clear. The globes and orbits are within normal limits. IMPRESSION: 1. 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 2. Additional small infarcts involving the more superior left frontal lobe, right corona radiata, left basal ganglia and bilateral cerebellum. This suggests a central source. 3. 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 No pathologic fractures are present. Electronically Signed   By: Marin Roberts M.D.   On: 12/30/2023 19:42   CT HEAD W & WO CONTRAST ( ) Result Date: 12/28/2023 CLINICAL DATA:  Brain metastases suspected. Newly diagnosed lung cancer. Headaches. EXAM: CT HEAD WITHOUT AND WITH CONTRAST TECHNIQUE: Contiguous axial images were obtained from the base of the skull through the vertex without and with intravenous contrast. RADIATION DOSE REDUCTION: This exam was performed according to the departmental dose-optimization program which includes automated exposure control, adjustment of the mA and/or kV according to patient size and/or use of iterative reconstruction technique. CONTRAST:  75mL OMNIPAQUE IOHEXOL 300 MG/ML  SOLN COMPARISON:  None Available. FINDINGS: Brain: There is no evidence of an acute infarct, intracranial hemorrhage, mass, midline shift, or extra-axial fluid collection. Cerebral volume is within normal limits for age. The ventricles are normal in size. Cerebral white matter hypodensities are nonspecific but compatible with mild chronic small vessel ischemic disease. No abnormal enhancement is identified. Vascular: The dural venous sinuses and large intracranial arteries are grossly patent.  Skull: No fracture.  Scattered small skull lesions. Sinuses/Orbits: Visualized paranasal sinuses and mastoid air cells are clear. Unremarkable orbits. Other: None. IMPRESSION: 1. No evidence of intracranial metastases. 2. Scattered small skull lesions, suspicious for metastases given evidence of widespread osseous metastases on last month's PET-CT. 3. Mild chronic small vessel ischemic disease. Electronically Signed   By: Sebastian Ache M.D.   On: 12/28/2023 15:13   US Venous Img Lower Unilateral Right Result Date: 12/28/2023 CLINICAL DATA:  RLE edema, calf pain, lung cancer EXAM: RIGHT LOWER EXTREMITY VENOUS DOPPLER ULTRASOUND TECHNIQUE: Gray-scale sonography with compression, as well as color and duplex ultrasound, were performed to evaluate the deep venous system(s) from the level of the common femoral vein through the popliteal and proximal calf veins. COMPARISON:  PET-CT, 12/08/2023 FINDINGS: VENOUS Normal compressibility of the common femoral, superficial femoral, as well as the visualized calf veins. Visualized portions of profunda femoral vein and great saphenous vein unremarkable. Heterogeneously-echogenic, occlusive filling defects within and noncompressibility of the imaged portions of the RIGHT popliteal as well as the imaged calf veins.  Limited views of the contralateral common femoral vein are unremarkable. OTHER No evidence of superficial thrombophlebitis or abnormal fluid collection. Limitations: none IMPRESSION: Acute RIGHT lower extremity DVT within the popliteal and calf veins. Roanna Banning, MD [facility] Electronically Signed   By: Roanna Banning M.D.   On: 12/28/2023 12:12   DG Chest Port 1 View Result Date: 12/21/2023 CLINICAL DATA:  Status post bronchoscopy. EXAM: PORTABLE CHEST 1 VIEW COMPARISON:  September 10, 2018.  December 08, 2023. FINDINGS: Normal cardiac size. Left perihilar and upper lobe opacity is noted concerning for  atelectasis or infiltrate and underlying neoplasm. No pneumothorax is noted status post bronchoscopy. Right lungs clear. IMPRESSION: Left perihilar and upper lobe opacities noted concerning for atelectasis or infiltrate and underlying neoplasm as noted on prior CT scan. No pneumothorax status post bronchoscopy. Electronically Signed   By: Lupita Raider M.D.   On: 12/21/2023 10:25   DG C-ARM BRONCHOSCOPY Result Date: 12/21/2023 C-ARM BRONCHOSCOPY: Fluoroscopy was utilized by the requesting physician.  No radiographic interpretation.    PERFORMANCE STATUS (ECOG) : 3 - Symptomatic, >50% confined to bed  Review of Systems Unless otherwise noted, a complete review of systems is negative.  Physical Exam General: NAD Pulmonary: Exertionally labored, on O2 Extremities: no edema, no joint deformities Skin: no rashes Neurological: Weakness, some confusion  IMPRESSION: Patient mid to the hospital with pneumonia.  Has had some delirium.  Patient more lucid today but remains intermittently confused.  I met with patient and wife.  Advance directives completed earlier today.  Patient is a DNR/DNI.  Family has discussed goals with patient and have decided to forego further cancer treatment.  Instead, they plan to take him home with hospice and focus on keeping him comfortable until end-of-life.  We did discuss possible future utilization of inpatient hospice facility if needed.  Wife would like to maximize him medically prior to discharge from the hospital.  Will have TOC and hospice liaison coordinate home care.  Symptomatically, he is somewhat exertionally dyspneic after ambulating to the bathroom.  Will start low-dose morphine elixir as patient can take this at home for comfort.  PLAN: -Best supportive care -TOC to coordinate hospice care at home -Morphine concentrate as needed for pain/dyspnea -DNR/DNI -Will follow  Case and plan discussed with Dr. Cathie Hoops and Dr. Allena Katz   Patient expressed  understanding and was in agreement with this plan. He also understands that He can call the clinic at any time with any questions, concerns, or complaints.     Time Total: 45 minutes  Visit consisted of counseling and education dealing with the complex and emotionally intense issues of symptom management and palliative care in the setting of serious and potentially life-threatening illness.Greater than 50%  of this time was spent counseling and coordinating care related to the above assessment and plan.  Signed by: Laurette Schimke, PhD, NP-C

## 2024-01-15 NOTE — Consult Note (Addendum)
 Hematology/Oncology Consult note [phone] [phone]      Patient Care Team: Emilio Aspen, MD as PCP - General (Internal Medicine) Rickard Patience, MD as Consulting Physician (Oncology) Glory Buff, RN as Oncology Nurse Navigator   Name of the patient: Joe Morton  [id number]  08-23-1951   REASON FOR COSULTATION:   History of presenting illness-  73 y.o. male with PMH listed at below who presents to ER for evaluation of recent diagnosis of pneumonia, worsening of cough, weakness despite taking oral antibiotics. increased weakness, decreased appetite, and significant weight loss. He consumes minimal food and fluids daily. He has tried megace and Remeron with no improvement of his appetite.  No fever or chills.   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   He has stage IV lung cancer with EGFR [id number] [ Patient was recently started on Osimertinib on 2/17.2025.   01/10/2024 CT chest abdomen pelvis w contrast showed interval increase of left pleural effusion, increase of left upper lobe consolidation and ground glass airspace opacity with narrowing of  the bronchial and surrounding a left hilar density  Patient was admitted for treatment of obstructive pneumonia. He has been on IV antibiotics. S/p left thoracentesis.   blood culture negative, -- sputum culture pending. Osimertinib has been held since admission.  Over the weekend, he developed hospital delirium and confusion which has improved today.  Wife at bedside today. He reports feeling better. Patient expressed desire of stopping treatment and leave hospital.  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) 12/25/2023    Priority: Medium    Goals of care, counseling/discussion 12/25/2023    Priority: Medium    Palliative care encounter 01/15/2024   Sepsis (HCC) 01/13/2024   Rib fracture 01/12/2024   Pneumonia 01/12/2024   Adenocarcinoma of lung, stage 4 (HCC) 01/01/2024     Past Medical History:  Diagnosis Date   Emphysema lung (HCC)    Hypertension    Non-small cell carcinoma of lung (HCC)      Past Surgical History:  Procedure Laterality Date   BRONCHIAL BIOPSY  12/21/2023   Procedure: BRONCHIAL BIOPSIES;  Surgeon: [name], MD;  Location: [facility];  Service: Pulmonary;;   BRONCHIAL NEEDLE ASPIRATION BIOPSY  12/21/2023   Procedure: BRONCHIAL NEEDLE ASPIRATION BIOPSIES;  Surgeon: [name], MD;  Location: [facility];  Service: Pulmonary;;   BRONCHIAL WASHINGS  12/21/2023   Procedure: BRONCHIAL WASHINGS;  Surgeon: [name], MD;  Location: [facility];  Service: Pulmonary;;   COLONOSCOPY     ENDOBRONCHIAL ULTRASOUND Bilateral 12/21/2023   Procedure: ENDOBRONCHIAL ULTRASOUND;  Surgeon: [name], MD;  Location: [facility];  Service: Pulmonary;  Laterality: Bilateral;   FINE NEEDLE ASPIRATION  12/21/2023   Procedure: FINE NEEDLE ASPIRATION (FNA) LINEAR;  Surgeon: [name], MD;  Location: [facility]  ENDOSCOPY;  Service: Pulmonary;;   INGUINAL HERNIA REPAIR Left 09/17/2018   Procedure: OPEN LEFT INGUINAL HERNIA REPAIR WITH MESH;  Surgeon: Jimmye Norman, MD;  Location: [facility] OR;  Service: General;  Laterality: Left;  GENERAL AND LMA   INSERTION OF MESH Left 09/17/2018   Procedure: INSERTION OF MESH;  Surgeon: Jimmye Norman, MD;  Location: [facility] OR;  Service: General;  Laterality: Left;  GENERAL AND LMA    Social History   Socioeconomic History   Marital status: Married    Spouse name: Not on file   Number of children: Not on file   Years of education: Not on file   Highest education level: Not on file  Occupational History   Not on file  Tobacco Use   Smoking status: Former    Current packs/day: 0.00    Types: Cigarettes    Quit date: 83    Years since quitting: 44.1   Smokeless tobacco: Never   Tobacco comments:    Smoking 3 cigarettes a day x 2 weeks   Vaping Use   Vaping status: Never Used  Substance and Sexual Activity   Alcohol use: Not Currently    Alcohol/week: 6.0 standard drinks of alcohol    Types: 6 Standard drinks or equivalent per week    Comment: occasionally   Drug use: No   Sexual activity: Not on file  Other Topics Concern   Not on file  Social History Narrative   Not on file   Social Drivers of Health   Financial Resource Strain: Low Risk  (12/25/2023)   Overall Financial Resource Strain (CARDIA)    Difficulty of Paying Living Expenses: Not very hard  Food Insecurity: No Food Insecurity (01/12/2024)   Hunger Vital Sign    Worried About Running Out of Food in the Last Year: Never true    Ran Out of Food in the Last Year: Never true  Transportation Needs: No Transportation Needs (01/12/2024)   PRAPARE - Administrator, Civil Service (Medical): No    Lack of Transportation (Non-Medical): No  Physical Activity: Not on file  Stress: No Stress Concern Present (12/25/2023)   [employer] of Occupational Health - Occupational Stress Questionnaire    Feeling of Stress : Only a little  Social Connections: Moderately Isolated (01/12/2024)   Social Connection and Isolation Panel [NHANES]    Frequency of Communication with Friends and Family: Twice a week    Frequency of Social Gatherings with Friends and Family: Twice a week    Attends Religious Services:  Never    Database administrator or Organizations: No    Attends Banker Meetings: Never    Marital Status: Married  Catering manager Violence: Not At Risk (01/12/2024)   Humiliation, Afraid, Rape, and Kick questionnaire    Fear of Current or Ex-Partner: No    Emotionally Abused: No    Physically Abused: No    Sexually Abused: No     History reviewed. No pertinent family history.   Current Facility-Administered Medications:    acetaminophen (TYLENOL) tablet 500 mg, 500 mg, Oral, Q6H PRN, Mikey College T, MD, 500 mg at 01/15/24 0914   azithromycin [facility]) tablet 500 mg, 500 mg, Oral, Daily, Tressie Ellis, RPH, 500 mg at 01/15/24 7829   benzonatate (TESSALON) capsule 100 mg, 100 mg, Oral, TID PRN, Mikey College T, MD, 100 mg at 01/15/24 0913   budesonide (PULMICORT) nebulizer solution 0.25 mg, 0.25 mg, Nebulization, BID, Chipper Herb, Ping T,  MD, 0.25 mg at 01/15/24 1954   cefTRIAXone (ROCEPHIN) 1 g in sodium chloride 0.9 % 100 mL IVPB, 1 g, Intravenous, Q24H, Mikey College T, MD, Last Rate: 200 mL/hr at 01/15/24 2105, 1 g at 01/15/24 2105   dronabinol (MARINOL) capsule 2.5 mg, 2.5 mg, Oral, QAC lunch, Mikey College T, MD, 2.5 mg at 01/15/24 1142   feeding supplement (ENSURE ENLIVE / ENSURE PLUS) liquid 237 mL, 237 mL, Oral, BID BM, Enedina Finner, MD   guaiFENesin (MUCINEX) 12 hr tablet 1,200 mg, 1,200 mg, Oral, BID, Mikey College T, MD, 1,200 mg at 01/15/24 2058   HYDROmorphone (DILAUDID) injection 0.5 mg, 0.5 mg, Intravenous, Q4H PRN, Mikey College T, MD, 0.5 mg at 01/13/24 0435   ipratropium-albuterol (DUONEB) 0.5-2.5 (3) MG/3ML nebulizer solution 3 mL, 3 mL, Nebulization, TID, Mikey College T, MD, 3 mL at 01/15/24 1954   lactulose (CHRONULAC) 10 GM/15ML solution 10-20 g, 10-20 g, Oral, BID PRN, Mikey College T, MD   lidocaine (LIDODERM) 5 % 1 patch, 1 patch, Transdermal, Q24H, Enedina Finner, MD, 1 patch at 01/15/24 2106   mirtazapine (REMERON SOL-TAB) disintegrating tablet 7.5 mg, 7.5 mg, Oral,  QHS, Enedina Finner, MD, 7.5 mg at 01/15/24 2058   morphine CONCENTRATE 10 mg / 0.5 ml oral solution 5 mg, 5 mg, Oral, Q2H PRN, Borders, Daryl Eastern, NP   multivitamin with minerals tablet 1 tablet, 1 tablet, Oral, Daily, Enedina Finner, MD, 1 tablet at 01/15/24 0913   ondansetron (ZOFRAN) tablet 8 mg, 8 mg, Oral, Q8H PRN, Emeline General, MD   Rivaroxaban (XARELTO) tablet 15 mg, 15 mg, Oral, BID WC, 15 mg at 01/15/24 1604 **FOLLOWED BY** [START ON 01/24/2024] rivaroxaban (XARELTO) tablet 20 mg, 20 mg, Oral, Q supper, Nazari, Walid A, RPH   traZODone (DESYREL) tablet 25 mg, 25 mg, Oral, QHS PRN, Enedina Finner, MD, 25 mg at 01/15/24 2058  Review of Systems  Constitutional:  Positive for appetite change, fatigue and unexpected weight change. Negative for chills and fever.  HENT:   Negative for hearing loss and voice change.   Eyes:  Negative for eye problems and icterus.  Respiratory:  Positive for cough and shortness of breath. Negative for chest tightness.   Cardiovascular:  Negative for chest pain and leg swelling.  Gastrointestinal:  Negative for abdominal distention and abdominal pain.  Endocrine: Negative for hot flashes.  Genitourinary:  Negative for difficulty urinating, dysuria and frequency.   Musculoskeletal:  Negative for arthralgias.  Skin:  Negative for itching and rash.  Neurological:  Negative for light-headedness and numbness.  Hematological:  Negative for adenopathy. Does not bruise/bleed easily.  Psychiatric/Behavioral:  Positive for confusion.         PHYSICAL EXAM Vitals:   01/15/24 1336 01/15/24 1555 01/15/24 1947 01/15/24 1954  BP:  93/62 102/73   Pulse:  90 89   Resp:  20 18   Temp:  97.7 F (36.5 C) (!) 97.5 F (36.4 C)   TempSrc:  Oral    SpO2: 95% 94% 93% 93%  Weight:      Height:       Physical Exam Constitutional:      General: He is not in acute distress.    Appearance: He is not diaphoretic.  HENT:     Head: Normocephalic and atraumatic.     Nose: Nose  normal.  Eyes:     General: No scleral icterus. Cardiovascular:     Rate and Rhythm: Normal rate and regular rhythm.  Pulmonary:  Effort: Pulmonary effort is normal. No respiratory distress. On nasal cannula oxygen    Breath sounds: No wheezing.  Abdominal:     General: There is no distension.     Palpations: Abdomen is soft.     Tenderness: There is no abdominal tenderness.  Musculoskeletal:        General: Normal range of motion.     Cervical back: Normal range of motion and neck supple.  Skin:    General: Skin is warm and dry.     Findings: No erythema.  Neurological:     Mental Status: He is alert and oriented to person, place, and time. Mental status is at baseline.     Motor: No abnormal muscle tone.  Psychiatric:        Mood and Affect: Affect normal.       LABORATORY STUDIES    Latest Ref Rng & Units 01/12/2024    2:20 PM 01/09/2024   10:53 PM 01/08/2024    3:34 PM  CBC  WBC 4.0 - 10.5 K/uL 13.0  12.7  14.3   Hemoglobin 13.0 - 17.0 g/dL 16.1  09.6  04.5   Hematocrit 39.0 - 52.0 % 48.5  46.2  49.0   Platelets 150 - 400 K/uL 63  98  123       Latest Ref Rng & Units 01/13/2024    4:39 AM 01/12/2024    2:20 PM 01/09/2024   10:53 PM  CMP  Glucose 70 - 99 mg/dL 96  409  811   BUN 8 - 23 mg/dL 20  23  26    Creatinine 0.61 - 1.24 mg/dL 9.14  7.82  9.56   Sodium 135 - 145 mmol/L 140  138  138   Potassium 3.5 - 5.1 mmol/L 3.8  4.1  3.7   Chloride 98 - 111 mmol/L 109  101  103   CO2 22 - 32 mmol/L 19  21  21    Calcium 8.9 - 10.3 mg/dL 9.6  21.3  08.6   Total Protein 6.5 - 8.1 g/dL  7.2  6.8   Total Bilirubin 0.0 - 1.2 mg/dL  1.0  0.9   Alkaline Phos 38 - 126 U/L  1,322  1,088   AST 15 - 41 U/L  41  51   ALT 0 - 44 U/L  23  27      RADIOGRAPHIC STUDIES: I have personally reviewed the radiological images as listed and agreed with the findings in the report. US THORACENTESIS ASP PLEURAL SPACE W/IMG GUIDE Result Date: 01/14/2024 INDICATION: Pneumonia, metastatic  lung carcinoma, left pleural effusion EXAM: ULTRASOUND GUIDED LEFT THORACENTESIS MEDICATIONS: Lidocaine 1% subcutaneous COMPLICATIONS: None immediate.  No pneumothorax on follow-up chest radiograph. PROCEDURE: An ultrasound guided thoracentesis was thoroughly discussed with the patient and questions answered. The benefits, risks, alternatives and complications were also discussed. The patient understands and wishes to proceed with the procedure. Written consent was obtained. Ultrasound was performed to localize and mark an adequate pocket of fluid in the left chest. The area was then prepped and draped in the normal sterile fashion. 1% Lidocaine was used for local anesthesia. Under ultrasound guidance a 6 Fr Safe-T-Centesis catheter was introduced. Thoracentesis was performed. The catheter was removed and a dressing applied. FINDINGS: A total of approximately 500 mL of cloudy yellow fluid was removed. Samples were sent to the laboratory as requested by the clinical team. IMPRESSION: Successful ultrasound guided left thoracentesis yielding 500 mL of pleural fluid. Electronically Signed  By: Corlis Leak M.D.   On: 01/14/2024 11:24   DG Chest Port 1 View Result Date: 01/14/2024 CLINICAL DATA:  Pleural effusion EXAM: PORTABLE CHEST 1 VIEW COMPARISON:  CT 01/09/2024 FINDINGS: Normal cardiac silhouette. Dense LEFT upper lobe pneumonia again noted. Small LEFT effusion is decreased in volume. RIGHT lung relatively clear. No pneumothorax. IMPRESSION: 1. Persistent LEFT upper lobe pneumonia. 2. Improvement in LEFT pleural effusion. Electronically Signed   By: Genevive Bi M.D.   On: 01/14/2024 10:36   CT HEAD WO CONTRAST ( ) Result Date: 01/14/2024 CLINICAL DATA:  73 year old male with altered mental status. Lung cancer. Recent MRI evidence of embolic appearing cerebral and cerebellar infarcts. Skeletal metastases. EXAM: CT HEAD WITHOUT CONTRAST TECHNIQUE: Contiguous axial images were obtained from the base of the  skull through the vertex without intravenous contrast. RADIATION DOSE REDUCTION: This exam was performed according to the departmental dose-optimization program which includes automated exposure control, adjustment of the mA and/or kV according to patient size and/or use of iterative reconstruction technique. COMPARISON:  Recent brain MRI 12/30/2023.  Head CT 01/02/2024. FINDINGS: Brain: Normal background cerebral volume for age. No ventriculomegaly. Cavum septum pellucidum. No midline shift. New wedge-shaped left cerebellar infarcts/edema on series 2, image 9, was negative on the MRI earlier this month. This is AICA territory. No associated hemorrhage or mass effect. Elsewhere gray-white differentiation appears stable, with regression of left middle frontal gyrus vasogenic edema (series 2, image 25). No other areas of new or increased edema. No acute intracranial hemorrhage identified. No intracranial mass effect or discrete mass lesion. Vascular: Calcified atherosclerosis at the skull base. No suspicious intracranial vascular hyperdensity. Skull: Widely scattered calvarium lucent lesions, most numerous at the vertex. Heterogeneous clivus. Heterogeneous visible upper cervical vertebrae. Known bone metastases. No acute fracture identified. Sinuses/Orbits: Visualized paranasal sinuses and mastoids are stable and well aerated. Other: No acute orbit or scalp soft tissue finding. IMPRESSION: 1. Patchy new Left cerebellar AICA infarct since 01/02/2024. No associated hemorrhage or mass effect. 2. Otherwise stable, evolved small infarcts demonstrated on MRI 12/30/2023. 3. 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 Electronically Signed   By: Odessa Fleming M.D.   On: 01/14/2024 06:56   DG Chest 1 View Result Date: 01/12/2024 CLINICAL DATA:  Pneumonia, known metastatic lung cancer EXAM: CHEST  1 VIEW COMPARISON:  01/09/2024 FINDINGS: Two frontal views of the chest demonstrate a stable cardiac silhouette. Hazy  opacity within the left hemithorax consistent with the left upper lobe consolidation and underlying mass as identified on multiple prior CT and PET scans. Chronic elevation of the left hemidiaphragm. Stable left pleural effusion. Right chest is clear. No acute fractures. IMPRESSION: 1. Continued left upper lobe consolidation compatible with known left hilar mass and postobstructive changes as seen on multiple prior exams. 2. Chronic elevation of the left hemidiaphragm, with stable left pleural effusion. Electronically Signed   By: Sharlet Salina M.D.   On: 01/12/2024 16:27   CT CHEST ABDOMEN PELVIS W CONTRAST Result Date: 01/10/2024 CLINICAL DATA:  Chest trauma, blunt. right rib pain after falling this morning. EXAM: CT CHEST, ABDOMEN, AND PELVIS WITH CONTRAST TECHNIQUE: Multidetector CT imaging of the chest, abdomen and pelvis was performed following the standard protocol during bolus administration of intravenous contrast. RADIATION DOSE REDUCTION: This exam was performed according to the departmental dose-optimization program which includes automated exposure control, adjustment of the mA and/or kV according to patient size and/or use of iterative reconstruction technique. CONTRAST:  OMNIPAQUE IOHEXOL 300 MG/ML  SOLN COMPARISON:  PET CT 12/08/2023 FINDINGS: CHEST: Cardiovascular: No aortic injury. The thoracic aorta is normal in caliber. The heart is normal in size. Slightly increased in size trace pericardial effusion. Mediastinum/Nodes: No pneumomediastinum. No mediastinal hematoma. The esophagus is unremarkable. The thyroid is unremarkable. The central airways are patent. No mediastinal, hilar, or axillary lymphadenopathy. Lungs/Pleura: Centrilobular emphysematous changes. Interval increase in left upper lobe consolidation and ground-glass airspace opacity with narrowing of the bronchial and surrounding a left hilar density consistent with known malignancy better evaluated on PET CT 12/08/2023. No  pulmonary nodule. No pulmonary mass. No pulmonary contusion or laceration. No pneumatocele formation. Interval increase in small volume left pleural effusion. No right pleural effusion. No pneumothorax. No hemothorax. Musculoskeletal/Chest wall: No chest wall mass. Old healed right rib fractures. No acute rib or sternal fracture. No spinal fracture. ABDOMEN / PELVIS: Hepatobiliary: Not enlarged. No focal lesion. No laceration or subcapsular hematoma. The gallbladder is otherwise unremarkable with no radio-opaque gallstones. No biliary ductal dilatation. Pancreas: Normal pancreatic contour. No main pancreatic duct dilatation. Spleen: Not enlarged. No focal lesion. No laceration, subcapsular hematoma, or vascular injury. Adrenals/Urinary Tract: No nodularity bilaterally. Bilateral kidneys enhance symmetrically. No hydronephrosis. No contusion, laceration, or subcapsular hematoma. No injury to the vascular structures or collecting systems. No hydroureter. The urinary bladder is unremarkable. On delayed imaging, there is no urothelial wall thickening and there are no filling defects in the opacified portions of the bilateral collecting systems or ureters. Stomach/Bowel: No small or large bowel wall thickening or dilatation. The appendix is unremarkable. Vasculature/Lymphatics: No abdominal aorta or iliac aneurysm. No active contrast extravasation or pseudoaneurysm. No abdominal, pelvic, inguinal lymphadenopathy. Reproductive: Normal. Other: No simple free fluid ascites. No pneumoperitoneum. No hemoperitoneum. No mesenteric hematoma identified. No organized fluid collection. Musculoskeletal: No significant soft tissue hematoma. No acute pelvic fracture. No spinal fracture. Heterogeneous lytic appendicular and axial skeleton consistent with metastasis. Multilevel degenerative changes of the spine. Ports and Devices: None. IMPRESSION: 1. No acute intrathoracic, intra-abdominal, intrapelvic traumatic injury. 2. No acute  fracture or traumatic malalignment of the thoracic or lumbar spine. 3. Interval increase in small volume left pleural effusion. 4. Interval increase in left upper lobe consolidation and ground-glass airspace opacity with narrowing of the bronchial and surrounding a left hilar density consistent with known malignancy better evaluated on PET CT 12/08/2023. Superimposed infection not excluded. 5. Heterogeneous lytic appendicular and axial skeleton consistent with metastasis. 6. Aortic Atherosclerosis (ICD10-I70.0) and Emphysema (ICD10-J43.9). Electronically Signed   By: Tish Frederickson M.D.   On: 01/10/2024 00:06   DG Ribs Unilateral W/Chest Right Result Date: 01/09/2024 CLINICAL DATA:  Recent fall with right-sided rib pain, initial encounter EXAM: RIGHT RIBS AND CHEST - 3+ VIEW COMPARISON:  12/21/2023 FINDINGS: Cardiac shadow is stable. Persistent left perihilar mass lesion with left upper lobe consolidation is noted and slightly worsened in the interval from the prior exam. Minimal left pleural effusion is noted. Right lung is clear. No pneumothorax is seen. Fractures involving the right seventh rib posteriorly and laterally are seen. No complicating factors are noted. IMPRESSION: Right seventh rib fracture without complicating factors. Electronically Signed   By: Alcide Clever M.D.   On: 01/09/2024 22:10   CT ANGIO HEAD NECK W WO CM Result Date: 01/02/2024 CLINICAL DATA:  Stroke/TIA, determine embolic source EXAM: CT ANGIOGRAPHY HEAD AND NECK WITH AND WITHOUT CONTRAST TECHNIQUE: Multidetector CT imaging of the head and neck was performed using the standard protocol during bolus administration of intravenous contrast. Multiplanar CT image reconstructions and MIPs  were obtained to evaluate the vascular anatomy. Carotid stenosis measurements (when applicable) are obtained utilizing NASCET criteria, using the distal internal carotid diameter as the denominator. RADIATION DOSE REDUCTION: This exam was performed  according to the departmental dose-optimization program which includes automated exposure control, adjustment of the mA and/or kV according to patient size and/or use of iterative reconstruction technique. CONTRAST:  75mL OMNIPAQUE IOHEXOL 350 MG/ML SOLN COMPARISON:  None Available. FINDINGS: CT HEAD FINDINGS Brain: Known acute infarcts better characterized on recent MRI. No progressive mass effect or acute hemorrhage. No midline shift. No hydrocephalus. Skull: Known metastases better seen on recent MRI. Sinuses/orbits: No acute abnormality. CTA NECK FINDINGS Aortic arch: Great vessel origins are patent without significant stenosis. Right carotid system: Atherosclerosis at the carotid bifurcation without greater than 50% stenosis. Left carotid system: Atherosclerosis at the carotid bifurcation without greater than 50% stenosis. Vertebral arteries: Right-dominant. No evidence of dissection, stenosis (50% or greater), or occlusion. Skeleton: No acute abnormality on limited assessment. Other neck: No acute abnormality on limited assessment. Upper chest: Partially imaged left upper lobe consolidation. Partially imaged layering left greater than right pleural effusions. Review of the MIP images confirms the above findings CTA HEAD FINDINGS Anterior circulation: Bilateral intracranial ICAs, MCAs, and ACAs are patent without proximal hemodynamically significant stenosis. Posterior circulation: Bilateral intradural vertebral arteries, basilar artery and bilateral posterior cerebral arteries are patent without proximal hemodynamically significant stenosis. Venous sinuses: As permitted by contrast timing, patent. Review of the MIP images confirms the above findings IMPRESSION: 1. No large vessel occlusion or proximal hemodynamically significant stenosis. 2. Partially imaged left upper lobe consolidation, compatible with pneumonia that may be post-obstructive given known mass seen on prior PET-CT. A CT of the chest could  further characterize if clinically warranted. 3. Partially imaged layering left greater than right pleural effusions. Electronically Signed   By: Feliberto Harts M.D.   On: 01/02/2024 19:15   ECHOCARDIOGRAM COMPLETE Result Date: 01/02/2024    ECHOCARDIOGRAM REPORT   Patient Name:   EMANUAL [facility]AIN [facility] Date of Exam: 01/02/2024 Medical Rec #:  [id number]   Height:       68.0 in Accession #:    [id number]  Weight:       153.6 lb Date of Birth:  [birth year]    BSA:          1.827 m Patient Age:    73 years    BP:           94/70 mmHg Patient Gender: M           HR:           80 bpm. Exam Location:  [facility] Procedure: 2D Echo, Cardiac Doppler and Color Doppler Indications:     Stroke I63.9  History:         Patient has no prior history of Echocardiogram examinations.                  Stroke.  Sonographer:     Lucendia Herrlich RCS Referring Phys:  [id number] Charise Killian Diagnosing Phys: Yvonne Kendall MD IMPRESSIONS  1. Left ventricular ejection fraction, by estimation, is 55 to 60%. The left ventricle has normal function. The left ventricle has no regional wall motion abnormalities. Left ventricular diastolic parameters were normal.  2. Right ventricular systolic function is normal. The right ventricular size is normal. There is normal pulmonary artery systolic pressure.  3. Right atrial size was mildly dilated.  4. The mitral valve is normal in structure.  Moderate mitral valve regurgitation. No evidence of mitral stenosis.  5. Tricuspid valve regurgitation is mild to moderate.  6. The aortic valve has an indeterminant number of cusps. Aortic valve regurgitation is trivial. No aortic stenosis is present.  7. Aortic dilatation noted. There is borderline dilatation of the aortic root, measuring 38 mm. There is mild dilatation of the ascending aorta, measuring 38 mm.  8. The inferior vena cava is normal in size with greater than 50% respiratory variability, suggesting right atrial pressure of 3 mmHg. FINDINGS  Left Ventricle:  Left ventricular ejection fraction, by estimation, is 55 to 60%. The left ventricle has normal function. The left ventricle has no regional wall motion abnormalities. The left ventricular internal cavity size was normal in size. There is  no left ventricular hypertrophy. Left ventricular diastolic parameters were normal. Right Ventricle: The right ventricular size is normal. No increase in right ventricular wall thickness. Right ventricular systolic function is normal. There is normal pulmonary artery systolic pressure. The tricuspid regurgitant velocity is 2.78 m/s, and  with an assumed right atrial pressure of 3 mmHg, the estimated right ventricular systolic pressure is 33.9 mmHg. Left Atrium: Left atrial size was normal in size. Right Atrium: Right atrial size was mildly dilated. Pericardium: Trivial pericardial effusion is present. Mitral Valve: The mitral valve is normal in structure. Mild mitral annular calcification. Moderate mitral valve regurgitation. No evidence of mitral valve stenosis. Tricuspid Valve: The tricuspid valve is normal in structure. Tricuspid valve regurgitation is mild to moderate. Aortic Valve: The aortic valve has an indeterminant number of cusps. Aortic valve regurgitation is trivial. No aortic stenosis is present. Aortic valve peak gradient measures 11.0 mmHg. Pulmonic Valve: The pulmonic valve was not well visualized. Pulmonic valve regurgitation is mild. No evidence of pulmonic stenosis. Aorta: Aortic dilatation noted. There is borderline dilatation of the aortic root, measuring 38 mm. There is mild dilatation of the ascending aorta, measuring 38 mm. Pulmonary Artery: The pulmonary artery is not well seen. Venous: The inferior vena cava is normal in size with greater than 50% respiratory variability, suggesting right atrial pressure of 3 mmHg. IAS/Shunts: The interatrial septum was not well visualized.  LEFT VENTRICLE PLAX 2D LVIDd:         5.20 cm   Diastology LVIDs:         3.70 cm    LV e' medial:    9.14 cm/s LV PW:         0.90 cm   LV E/e' medial:  11.4 LV IVS:        0.90 cm   LV e' lateral:   12.80 cm/s LVOT diam:     2.20 cm   LV E/e' lateral: 8.1 LV SV:         78 LV SV Index:   42 LVOT Area:     3.80 cm  RIGHT VENTRICLE             IVC RV S prime:     23.20 cm/s  IVC diam: 1.50 cm TAPSE (M-mode): 3.1 cm LEFT ATRIUM             Index        RIGHT ATRIUM           Index LA diam:        3.50 cm 1.92 cm/m   RA Area:     21.40 cm LA Vol (A2C):   47.8 ml 26.17 ml/m  RA Volume:   73.90 ml  40.45  ml/m LA Vol (A4C):   31.0 ml 16.94 ml/m LA Biplane Vol: 36.6 ml 20.03 ml/m  AORTIC VALVE AV Area (Vmax): 2.66 cm AV Vmax:        166.00 cm/s AV Peak Grad:   11.0 mmHg LVOT Vmax:      116.00 cm/s LVOT Vmean:     76.600 cm/s LVOT VTI:       0.204 m  AORTA Ao Root diam: 3.80 cm Ao Asc diam:  3.80 cm MITRAL VALVE                TRICUSPID VALVE MV Area (PHT): 3.48 cm     TR Peak grad:   30.9 mmHg MV Decel Time: 218 msec     TR Vmax:        278.00 cm/s MR Peak grad: 78.8 mmHg MR Vmax:      443.75 cm/s   SHUNTS MV E velocity: 104.00 cm/s  Systemic VTI:  0.20 m MV A velocity: 88.60 cm/s   Systemic Diam: 2.20 cm MV E/A ratio:  1.17 Yvonne Kendall MD Electronically signed by Yvonne Kendall MD Signature Date/Time: 01/02/2024/6:29:56 PM    Final    MR Brain W Wo Contrast Result Date: 12/30/2023 CLINICAL DATA:  Non-small cell lung cancer, staging. EXAM: MRI HEAD WITHOUT AND WITH CONTRAST TECHNIQUE: Multiplanar, multiecho pulse sequences of the brain and surrounding structures were obtained without and with intravenous contrast. CONTRAST:  7mL GADAVIST GADOBUTROL 1 MMOL/ML IV SOLN COMPARISON:  CT head without contrast 12/28/2023. 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 Additional punctate cortical infarcts are present just superior to the largest area within the pre frontal cortex. Three separate punctate white matter scratched at 3  separate punctate acute white matter infarcts are present in the right corona radiata. A punctate area of acute infarction is present in the left lentiform nucleus. A punctate acute cortical infarct is present the left occipital pole. A 5 mm acute nonhemorrhagic infarct is present in the inferior right cerebellum. A punctate infarct is present inferiorly in the left cerebellum. Vascular: Flow is present in the major intracranial arteries. Skull and upper cervical spine: Craniocervical junction is normal. 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 No pathologic fractures are present. Sinuses/Orbits: The paranasal sinuses and mastoid air cells are clear. The globes and orbits are within normal limits. IMPRESSION: 1. 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 2. Additional small infarcts involving the more superior left frontal lobe, right corona radiata, left basal ganglia and bilateral cerebellum. This suggests a central source. 3. 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 No pathologic fractures are present. Electronically Signed   By: Marin Roberts M.D.   On: 12/30/2023 19:42   CT HEAD W & WO CONTRAST ( ) Result Date: 12/28/2023 CLINICAL DATA:  Brain metastases suspected. Newly diagnosed lung cancer. Headaches. EXAM: CT HEAD WITHOUT AND WITH CONTRAST TECHNIQUE: Contiguous axial images were obtained from the base of the skull through the vertex without and with intravenous contrast. RADIATION DOSE REDUCTION: This exam was performed according to the departmental dose-optimization program which includes automated exposure control, adjustment of the mA and/or kV according to patient size and/or use of iterative reconstruction technique. CONTRAST:  75mL OMNIPAQUE IOHEXOL 300 MG/ML  SOLN COMPARISON:  None Available. FINDINGS: Brain: There is no  evidence of an acute infarct, intracranial  hemorrhage, mass, midline shift, or extra-axial fluid collection. Cerebral volume is within normal limits for age. The ventricles are normal in size. Cerebral white matter hypodensities are nonspecific but compatible with mild chronic small vessel ischemic disease. No abnormal enhancement is identified. Vascular: The dural venous sinuses and large intracranial arteries are grossly patent. Skull: No fracture.  Scattered small skull lesions. Sinuses/Orbits: Visualized paranasal sinuses and mastoid air cells are clear. Unremarkable orbits. Other: None. IMPRESSION: 1. No evidence of intracranial metastases. 2. Scattered small skull lesions, suspicious for metastases given evidence of widespread osseous metastases on last month's PET-CT. 3. Mild chronic small vessel ischemic disease. Electronically Signed   By: Sebastian Ache M.D.   On: 12/28/2023 15:13   US Venous Img Lower Unilateral Right Result Date: 12/28/2023 CLINICAL DATA:  RLE edema, calf pain, lung cancer EXAM: RIGHT LOWER EXTREMITY VENOUS DOPPLER ULTRASOUND TECHNIQUE: Gray-scale sonography with compression, as well as color and duplex ultrasound, were performed to evaluate the deep venous system(s) from the level of the common femoral vein through the popliteal and proximal calf veins. COMPARISON:  PET-CT, 12/08/2023 FINDINGS: VENOUS Normal compressibility of the common femoral, superficial femoral, as well as the visualized calf veins. Visualized portions of profunda femoral vein and great saphenous vein unremarkable. Heterogeneously-echogenic, occlusive filling defects within and noncompressibility of the imaged portions of the RIGHT popliteal as well as the imaged calf veins. Limited views of the contralateral common femoral vein are unremarkable. OTHER No evidence of superficial thrombophlebitis or abnormal fluid collection. Limitations: none IMPRESSION: Acute RIGHT lower extremity DVT within the popliteal and calf  veins. Roanna Banning, MD [facility] Electronically Signed   By: Roanna Banning M.D.   On: 12/28/2023 12:12   DG Chest Port 1 View Result Date: 12/21/2023 CLINICAL DATA:  Status post bronchoscopy. EXAM: PORTABLE CHEST 1 VIEW COMPARISON:  September 10, 2018.  December 08, 2023. FINDINGS: Normal cardiac size. Left perihilar and upper lobe opacity is noted concerning for atelectasis or infiltrate and underlying neoplasm. No pneumothorax is noted status post bronchoscopy. Right lungs clear. IMPRESSION: Left perihilar and upper lobe opacities noted concerning for atelectasis or infiltrate and underlying neoplasm as noted on prior CT scan. No pneumothorax status post bronchoscopy. Electronically Signed   By: Lupita Raider M.D.   On: 12/21/2023 10:25   DG C-ARM BRONCHOSCOPY Result Date: 12/21/2023 C-ARM BRONCHOSCOPY: Fluoroscopy was utilized by the requesting physician.  No radiographic interpretation.   NM PET Image Initial (PI) Skull Base To Thigh (F-18 FDG) Result Date: 12/16/2023 CLINICAL DATA:  Initial treatment strategy for left upper lobe pulmonary mass. EXAM: NUCLEAR MEDICINE PET SKULL BASE TO THIGH TECHNIQUE: 8.61 mCi F-18 FDG was injected intravenously. Full-ring PET imaging was performed from the skull base to thigh after the radiotracer. CT data was obtained and used for attenuation correction and anatomic localization. Fasting blood glucose: 97 mg/dl COMPARISON:  Chest CT, same date. FINDINGS: Mediastinal blood pool activity: SUV max 2.13 Liver activity: SUV max NA NECK: No hypermetabolic lymph nodes in the neck. Incidental CT findings: Scattered calcifications in the tonsillar regions consistent with prior inflammation or infection. CHEST: The left upper lobe pulmonary lesion adjacent to the major fissure is hypermetabolic with SUV max of 6.0 and consistent with primary lung neoplasm. There is hypermetabolic mediastinal lymphadenopathy. Right  paratracheal node has an SUV max of 4.87. Left hilar disease which could be direct extension of tumor or adenopathy has an SUV max of 6.66. This surrounds the left upper  lobe bronchus and partially occludes it. Subcarinal node has an SUV max of 5.36. Left pleural effusion but no hypermetabolic pleural lesions. Incidental CT findings: Stable vascular calcifications. Small left pleural effusion. ABDOMEN/PELVIS: No abnormal hypermetabolic activity within the liver, pancreas, adrenal glands, or spleen. No hypermetabolic lymph nodes in the abdomen or pelvis. Incidental CT findings: Moderate atherosclerotic calcifications involving the aorta and branch vessels but no aneurysm. Scattered colonic diverticulosis. SKELETON: Diffuse scattered hypermetabolic bone lesions consistent with widespread metastatic bone disease. Sternal lesion has an SUV max of 3.42 Right sixth rib lesion has an SUV max of 4.59 T7 lesion has an SUV max of 5.26 Right iliac bone lesion has an SUV max of 5.89. Left hip/bus or trochanteric lesion has an SUV max of 4.39. Incidental CT findings: None. IMPRESSION: 1. Hypermetabolic left upper lobe pulmonary mass invading the left hilum consistent with primary lung neoplasm. 2. Hypermetabolic mediastinal and left hilar lymphadenopathy consistent with metastatic disease. 3. Diffuse scattered hypermetabolic bone lesions consistent with widespread metastatic bone disease. 4. No findings for metastatic disease involving the abdomen/pelvis. 5. Small left pleural effusion but no hypermetabolic pleural lesions. 6. Aortic atherosclerosis. Aortic Atherosclerosis (ICD10-I70.0). Electronically Signed   By: Rudie Meyer M.D.   On: 12/16/2023 17:36   CT SUPER D CHEST WO CONTRAST Result Date: 12/16/2023 CLINICAL DATA:  Left upper lobe pulmonary nodule. EXAM: CT CHEST WITHOUT CONTRAST TECHNIQUE: Multidetector CT imaging of the chest was performed using thin slice collimation for electromagnetic bronchoscopy planning  purposes, without intravenous contrast. RADIATION DOSE REDUCTION: This exam was performed according to the departmental dose-optimization program which includes automated exposure control, adjustment of the mA and/or kV according to patient size and/or use of iterative reconstruction technique. COMPARISON:  11/07/2023 FINDINGS: Cardiovascular: The heart size is normal. No substantial pericardial effusion. Ascending thoracic aorta measures 4.1 cm diameter. Mild atherosclerotic calcification is noted in the wall of the thoracic aorta. Mediastinum/Nodes: Scattered normal and upper normal sized lymph nodes are seen in the mediastinum including 9 mm short axis right paratracheal node on 67/2. Calcified nodal tissue is seen in the left hilum with abnormal soft tissue density in the left suprahilar region encasing the left upper lobe bronchus. The esophagus has normal imaging features.There is no axillary lymphadenopathy. Lungs/Pleura: Centrilobular emphsyema noted. 3 mm right middle lobe nodule on image 130/4 is new in the interval. 5 mm subpleural right upper lobe nodule on 73/4 is stable. 4 mm right upper lobe nodule on 61/4 is new. 2 mm right upper lobe nodule on 41/4 has decreased from 4 mm previously. 5 mm subpleural right lower lobe nodule on 91/4 is stable. Soft tissue fullness in the right hilum and suprahilar region is progressive in the interval, encasing the bronchi to the apical in lingular segments of the left upper lobe. 6.7 x 3.5 cm masslike consolidative opacity in the posterior left upper lobe is new in the interval. This is associated with anteromedial left upper lobe volume loss adjacent to the mediastinum. Calcified granulomata noted left base. Small left pleural effusion is new in the interval. Upper Abdomen: No acute findings. 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 Musculoskeletal: No worrisome lytic or sclerotic osseous abnormality. IMPRESSION: 1.  Progressive soft tissue fullness in the left hilum and suprahilar region, encasing the bronchi to the apical and lingular segments of the left upper lobe. This soft tissue is confluent with a 6.7 x 3.5 cm masslike consolidative opacity in the posterior left upper lobe, new in the  interval. Imaging features raise concern for neoplasm with volume loss in the posterior and anteromedial left upper lobe. 2. New small left pleural effusion. 3. Waxing and waning of small pulmonary nodules in the right lung. Continued close attention on follow-up recommended. 4. Aortic Atherosclerosis (ICD10-I70.0) and Emphysema (ICD10-J43.9). Electronically Signed   By: Kennith Center M.D.   On: 12/16/2023 14:03   CT ANGIO CHEST AORTA W/CM & OR WO/CM Result Date: 11/11/2023 CLINICAL DATA:  Follow-up of ascending thoracic aortic aneurysm. Left lung nodule. EXAM: CT ANGIOGRAPHY CHEST WITH CONTRAST TECHNIQUE: Multidetector CT imaging of the chest was performed using the standard protocol during bolus administration of intravenous contrast. Multiplanar CT image reconstructions and MIPs were obtained to evaluate the vascular anatomy. RADIATION DOSE REDUCTION: This exam was performed according to the departmental dose-optimization program which includes automated exposure control, adjustment of the mA and/or kV according to patient size and/or use of iterative reconstruction technique. CONTRAST:  75mL ISOVUE-370 IOPAMIDOL (ISOVUE-370) INJECTION 76% COMPARISON:  CT of the chest on 09/28/2018 FINDINGS: Cardiovascular: The aortic root measures between approximately 3.6-4.0 cm at the level of the sinuses of Valsalva. The ascending thoracic aorta demonstrates stable mild aneurysmal disease measuring up to approximately 4 cm in maximum diameter. The aortic arch demonstrates stable tortuosity with the proximal arch measuring 2.9 cm and the distal arch 2.3 cm. The descending thoracic aorta measures 2.6 cm. No evidence of aortic dissection. Proximal  great vessels demonstrate normal patency with normal variant separate origin of the left vertebral artery off of the aortic arch. The heart size is within normal limits. Mild amount of calcified coronary artery plaque present. No pericardial fluid. Central pulmonary arteries are normal in caliber. Mediastinum/Nodes: Numerous small scattered mediastinal lymph nodes are present. The largest measures roughly 8 mm in short axis at the level of the AP window. There are some calcified hilar lymph nodes in the left hilum. Lungs/Pleura: Significant emphysematous lung disease present as well as diffuse perihilar bronchial thickening bilaterally. There is an area of spiculation at a convergence point of posterior left upper lobe bronchi over a region measuring roughly 1.9 x 1.3 x 2.0 cm. This is also associated with adjacent nodularity and thickening of the adjacent major fissure over several cm. This was not present in 2019. Although this may be secondary to inflammation/infection, malignancy is not excluded and further follow-up and workup may be indicated including short-term interval follow-up CT, possible PET scan and consideration of bronchoscopy targeted to this region. Areas of scattered scarring are present bilaterally. Several tiny subpleural nodules and nodularity bilaterally. Calcified granuloma in the inferior left lower lobe. Upper Abdomen: Probable hepatic steatosis.  No adrenal masses. Musculoskeletal: No chest wall abnormality. No acute or significant osseous findings. Review of the MIP images confirms the above findings. IMPRESSION: 1. 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 Recommend annual imaging followup by CTA or MRA. This recommendation follows 2010 ACCF/AHA/AATS/ACR/ASA/SCA/SCAI/SIR/STS/SVM Guidelines for the Diagnosis and Management of Patients with Thoracic Aortic Disease. Circulation. 2010; 121: N629-B284. Aortic aneurysm NOS  (ICD10-I71.9) 2. Stable tortuosity of the aortic arch. 3. Significant emphysematous lung disease with diffuse perihilar bronchial thickening bilaterally. 4. There is an area of spiculation at a convergence point of posterior left upper lobe bronchi over a region measuring roughly 1.9 x 1.3 x 2.0 cm. This is also associated with adjacent nodularity and thickening of the adjacent major fissure over several cm. This was not present in 2019. Although this  may be secondary to inflammation/infection, malignancy is not excluded and further follow-up and workup may be indicated including short-term interval follow-up CT, possible PET scan and consideration of bronchoscopy targeted to this region. Referral to Pulmonology for follow-up is recommended. 5. Numerous small scattered mediastinal lymph nodes are present. The largest measures roughly 8 mm in short axis at the level of the AP window. 6. Probable hepatic steatosis. Aortic Atherosclerosis (ICD10-I70.0) and Emphysema (ICD10-J43.9). Electronically Signed   By: Irish Lack M.D.   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 Recently started on Osimertib  # Obstructive pneumonia due to malignancy.  # malignant pleural effusion s/p diagnostic thoracentesis. Cytology is positive for NSCLC.  # Goals of care discussion.  Continue IV antibiotics. Clinically he has improved.  I had lengthy discussion with patient and his wife regarding goals of care. His condition is not curable and over prognosis is poor. Osimertinib has 60-70% response rate and may prolong his life.  Patient previously wanted to continue cancer treatments and today he has expressed desire of stopping treatment and be enrolled to hospice care. I respect his wish and they are made aware that there is no need to resume Osimertinib if the ultimate goal is to focus on qualify of life, hospice/ comfort care. Wife would like to  maximize him medically prior to discharge from the hospital.  He will further discuss with palliative care today.  Thrombocytopenia, likely due to marrow suppression from current infection, osimertinib side effects. Recommend holding anticoagulation if Platelet count drops below 50,000  Thank you for allowing me to participate in the care of this patient. Plan was discussed with hospitalist, and palliative care service.   Rickard Patience, MD, PhD Hematology Oncology 01/15/2024

## 2024-01-15 NOTE — TOC Progression Note (Signed)
 Transition of Care [facility]) - Progression Note    Patient Details  Name: Joe Morton MRN: [id number] Date of Birth: [birth year]  [facility] CM/SW Contact  Erin Sons, [place] Phone Number: 01/15/2024, 10:44 AM  Clinical Narrative:     CSW is notified via secure chat from Oncology NP that pt/family wanting home hospice with preference for [facility]. CSW called pt's spouse and confirmed reference for [facility]. She states pt will need O2 and is may be interested in a hospital bed. Referral made to [facility].   Expected Discharge Plan: Home w Hospice Care Barriers to Discharge: Continued Medical Work up  Expected Discharge Plan and Services       Living arrangements for the past 2 months: Single Family Home                                       Social Determinants of Health (SDOH) Interventions SDOH Screenings   Food Insecurity: No Food Insecurity (01/12/2024)  Housing: Low Risk  (01/12/2024)  Transportation Needs: No Transportation Needs (01/12/2024)  Utilities: Not At Risk (01/12/2024)  Depression (PHQ2-9): Low Risk  (12/25/2023)  Financial Resource Strain: Low Risk  (12/25/2023)  Social Connections: Moderately Isolated (01/12/2024)  Stress: No Stress Concern Present (12/25/2023)  Tobacco Use: Medium Risk (01/12/2024)  Health Literacy: Adequate Health Literacy (12/25/2023)    Readmission Risk Interventions     No data to display

## 2024-01-15 NOTE — Progress Notes (Signed)
   01/15/24 0930  Spiritual Encounters  Type of Visit Initial  Care provided to: Patient;Family  Conversation partners present during Programmer, systems;Other (comment) Training and development officer)  Referral source Family;Patient request  Reason for visit Advance directives  OnCall Visit No   Chaplain visited patient because there was a request for an Advanced Directive that came through the [place] phone.  Chaplain explained the AD packet and coordinated the notary and volunteers for witnesses.  Rev. Rana M. Earlene Plater, MDiv Chaplain Resident [facility]

## 2024-01-15 NOTE — Progress Notes (Signed)
 [facility]  - [place] at [facility]   PATIENT NAME: Joe Morton    MR#:  [id number]  DATE OF BIRTH:  25-Mar-1951  SUBJECTIVE:  daughter and wife at bedside. Patient appears weak and exhausted. He earlier walked around the nurses station with physical therapist using walker. I did maintain his oxygen sats up however was short of breath. Family feels patient has intermittent confusion.  VITALS:  Blood pressure 112/77, pulse 100, temperature 97.9 F (36.6 C), temperature source Oral, resp. rate 20, height 5\' 8"  (1.727 m), weight 67.1 kg, SpO2 95%.  PHYSICAL EXAMINATION:   GENERAL:  73 y.o.-year-old patient with no acute distress. Appears ill, frail thin LUNGS: distant breath sounds bilaterally, coarse breath sounds CARDIOVASCULAR: S1, S2 normal. tachycardia ABDOMEN: Soft, nontender, nondistended.  EXTREMITIES: No  edema b/l.    NEUROLOGIC: nonfocal  patient is alert and awake, weak  LABORATORY PANEL:  CBC Recent Labs  Lab 01/12/24 1420  WBC 13.0*  HGB 16.2  HCT 48.5  PLT 63*    Chemistries  Recent Labs  Lab 01/12/24 1420 01/13/24 0439  NA 138 140  K 4.1 3.8  CL 101 109  CO2 21* 19*  GLUCOSE 108* 96  BUN 23 20  CREATININE 1.31* 1.04  CALCIUM 10.4* 9.6  AST 41  --   ALT 23  --   ALKPHOS 1,322*  --   BILITOT 1.0  --     RADIOLOGY:  US THORACENTESIS ASP PLEURAL SPACE W/IMG GUIDE Result Date: 01/14/2024 INDICATION: Pneumonia, metastatic lung carcinoma, left pleural effusion EXAM: ULTRASOUND GUIDED LEFT THORACENTESIS MEDICATIONS: Lidocaine 1% subcutaneous COMPLICATIONS: None immediate.  No pneumothorax on follow-up chest radiograph. PROCEDURE: An ultrasound guided thoracentesis was thoroughly discussed with the patient and questions answered. The benefits, risks, alternatives and complications were also discussed. The patient understands and wishes to proceed with the procedure. Written consent was obtained. Ultrasound was performed to localize and mark an  adequate pocket of fluid in the left chest. The area was then prepped and draped in the normal sterile fashion. 1% Lidocaine was used for local anesthesia. Under ultrasound guidance a 6 Fr Safe-T-Centesis catheter was introduced. Thoracentesis was performed. The catheter was removed and a dressing applied. FINDINGS: A total of approximately 500 mL of cloudy yellow fluid was removed. Samples were sent to the laboratory as requested by the clinical team. IMPRESSION: Successful ultrasound guided left thoracentesis yielding 500 mL of pleural fluid. Electronically Signed   By: Corlis Leak M.D.   On: 01/14/2024 11:24   DG Chest Port 1 View Result Date: 01/14/2024 CLINICAL DATA:  Pleural effusion EXAM: PORTABLE CHEST 1 VIEW COMPARISON:  CT 01/09/2024 FINDINGS: Normal cardiac silhouette. Dense LEFT upper lobe pneumonia again noted. Small LEFT effusion is decreased in volume. RIGHT lung relatively clear. No pneumothorax. IMPRESSION: 1. Persistent LEFT upper lobe pneumonia. 2. Improvement in LEFT pleural effusion. Electronically Signed   By: Genevive Bi M.D.   On: 01/14/2024 10:36   CT HEAD WO CONTRAST ( ) Result Date: 01/14/2024 CLINICAL DATA:  73 year old male with altered mental status. Lung cancer. Recent MRI evidence of embolic appearing cerebral and cerebellar infarcts. Skeletal metastases. EXAM: CT HEAD WITHOUT CONTRAST TECHNIQUE: Contiguous axial images were obtained from the base of the skull through the vertex without intravenous contrast. RADIATION DOSE REDUCTION: This exam was performed according to the departmental dose-optimization program which includes automated exposure control, adjustment of the mA and/or kV according to patient size and/or use of iterative reconstruction technique. COMPARISON:  Recent brain  MRI 12/30/2023.  Head CT 01/02/2024. FINDINGS: Brain: Normal background cerebral volume for age. No ventriculomegaly. Cavum septum pellucidum. No midline shift. New wedge-shaped left  cerebellar infarcts/edema on series 2, image 9, was negative on the MRI earlier this month. This is AICA territory. No associated hemorrhage or mass effect. Elsewhere gray-white differentiation appears stable, with regression of left middle frontal gyrus vasogenic edema (series 2, image 25). No other areas of new or increased edema. No acute intracranial hemorrhage identified. No intracranial mass effect or discrete mass lesion. Vascular: Calcified atherosclerosis at the skull base. No suspicious intracranial vascular hyperdensity. Skull: Widely scattered calvarium lucent lesions, most numerous at the vertex. Heterogeneous clivus. Heterogeneous visible upper cervical vertebrae. Known bone metastases. No acute fracture identified. Sinuses/Orbits: Visualized paranasal sinuses and mastoids are stable and well aerated. Other: No acute orbit or scalp soft tissue finding. IMPRESSION: 1. Patchy new Left cerebellar AICA infarct since 01/02/2024. No associated hemorrhage or mass effect. 2. Otherwise stable, evolved small infarcts demonstrated on MRI 12/30/2023. 3. 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 Electronically Signed   By: Odessa Fleming M.D.   On: 01/14/2024 06:56    Assessment and Plan Joe Morton is a 73 y.o. 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   CT chest and abdomen on February 18 1. No acute intrathoracic, intra-abdominal, intrapelvic traumatic injury. 2. No acute fracture or traumatic malalignment of the thoracic or lumbar spine. 3. Interval increase in small volume left pleural effusion. 4. Interval increase in left upper lobe consolidation and ground-glass airspace opacity with narrowing of the  bronchial and surrounding a left hilar density consistent with known malignancy better evaluated on PET CT 12/08/2023. Superimposed infection not excluded. 5. Heterogeneous lytic appendicular and axial skeleton consistent with metastasis. 6. Aortic Atherosclerosis (ICD10-I70.0) and Emphysema (ICD10-J43.9).   Sepsis due to left upper lobe pneumonia/post obstructive left lung mass left pleural effusion -- came in with tachycardia, elevated white count, left upper lobe pneumonia -- IV Rocephin and Zithromax -- chest physiotherapy, flutter valve, incentive spirometer and nebulizer -- patient coughing up pink frothy sputum -- IR consulted for ultrasound-guided thoracentesis -- blood culture negative -- sputum culture no organisms seen. Squamous cell epithelial cells present  Left lung non small cell LL with bony metastasis malignant pleural effusion status post thoracentesis -- patient on oral chemo drug which will hold -- discussed with Dr. Smith Robert -- patient follows with Dr. Cathie Hoops -- pleural fluid is positive for malignant cells  Hypercalcemia -- came in with calcium of 11.1-- 10.6--- IV fluids-- 9.6  Protein calorie malnutrition moderate -- patient has been followed by  dietitian at the cancer center  Generalized weakness with recent fall -- seen by PT. Will arrange home health PT  Right lower extremity DVT -- continue Xarelto  Confusion/hallucination/insomnia -- CT had done last night showed stroke which was similar to previous CT head. Extensive skeletal mental stasis noted -- will resume Remeron -- PO RN trazodone  Long term prognosis poor.  Discussion was made by Dr. Cathie Hoops, Sharia Reeve Borders palliative nurse practitioner and me along with patient's daughter wife in the room. Patient and family understands patient has overall poor prognosis. Will continue IV antibiotic for one more day switch to oral antibiotic and decision is made to go home  with hospice to follow. [facility] care  hospice has been notified. All questions answered. Patient wife and daughter agreeable.  Family communication : wife /dter at bedside Consults : IR, oncology CODE STATUS: DNR DVT Prophylaxis : Xarelto Level of care: Telemetry Medical Status is: Inpatient Remains inpatient appropriate because: IV antibiotics for sepsis due to pneumonia    TOTAL TIME TAKING CARE OF THIS PATIENT: 45 minutes.  >50% time spent on counselling and coordination of care  Note: This dictation was prepared with Dragon dictation along with smaller phrase technology. Any transcriptional errors that result from this process are unintentional.  Enedina Finner M.D    [facility]   CC: Primary care physician; Emilio Aspen, MD

## 2024-01-15 NOTE — Progress Notes (Addendum)
 [facility] Liaison Note  Received a referral from [facility], Laurette Schimke, [place] to discuss Hospice services with patient and his spouse.  Patient and spouse are wanting to have further goals of care conversations before proceeding.  Elouise Munroe, NP will follow up with patient and spouse tomorrow.    Please call with any Hospice related questions or concerns.    Thank you for the opportunity to participate in this patient's care.  [facility] Liaison [phone]

## 2024-01-15 NOTE — Plan of Care (Signed)
   Problem: Education: Goal: Knowledge of General Education information will improve Description: Including pain rating scale, medication(s)/side effects and non-pharmacologic comfort measures Outcome: Not Progressing   Problem: Health Behavior/Discharge Planning: Goal: Ability to manage health-related needs will improve Outcome: Not Progressing   Problem: Clinical Measurements: Goal: Ability to maintain clinical measurements within normal limits will improve Outcome: Not Progressing Goal: Will remain free from infection Outcome: Not Progressing Goal: Diagnostic test results will improve Outcome: Not Progressing Goal: Respiratory complications will improve Outcome: Not Progressing Goal: Cardiovascular complication will be avoided Outcome: Not Progressing   Problem: Activity: Goal: Risk for activity intolerance will decrease Outcome: Not Progressing   Problem: Nutrition: Goal: Adequate nutrition will be maintained Outcome: Not Progressing   Problem: Coping: Goal: Level of anxiety will decrease Outcome: Not Progressing   Problem: Elimination: Goal: Will not experience complications related to bowel motility Outcome: Not Progressing Goal: Will not experience complications related to urinary retention Outcome: Not Progressing   Problem: Pain Managment: Goal: General experience of comfort will improve and/or be controlled Outcome: Not Progressing   Problem: Safety: Goal: Ability to remain free from injury will improve Outcome: Not Progressing   Problem: Skin Integrity: Goal: Risk for impaired skin integrity will decrease Outcome: Not Progressing   Problem: Activity: Goal: Ability to tolerate increased activity will improve Outcome: Not Progressing   Problem: Clinical Measurements: Goal: Ability to maintain a body temperature in the normal range will improve Outcome: Not Progressing   Problem: Respiratory: Goal: Ability to maintain adequate ventilation will  improve Outcome: Not Progressing Goal: Ability to maintain a clear airway will improve Outcome: Not Progressing

## 2024-01-16 ENCOUNTER — Encounter: Payer: Self-pay | Admitting: *Deleted

## 2024-01-16 DIAGNOSIS — C7951 Secondary malignant neoplasm of bone: Secondary | ICD-10-CM

## 2024-01-16 DIAGNOSIS — J189 Pneumonia, unspecified organism: Secondary | ICD-10-CM | POA: Diagnosis not present

## 2024-01-16 DIAGNOSIS — J90 Pleural effusion, not elsewhere classified: Secondary | ICD-10-CM | POA: Diagnosis not present

## 2024-01-16 LAB — CULTURE, RESPIRATORY W GRAM STAIN

## 2024-01-16 LAB — MYCOPLASMA PNEUMONIAE ANTIBODY, IGM: Mycoplasma pneumo IgM: <770 U/mL (ref 0–769)

## 2024-01-16 MED ORDER — GUAIFENESIN ER 600 MG PO TB12: 1200.0000 mg | ORAL_TABLET | Freq: Two times a day (BID) | ORAL | 0 refills | Status: AC

## 2024-01-16 MED ORDER — TRAZODONE HCL 50 MG PO TABS: 25.0000 mg | ORAL_TABLET | Freq: Every evening | ORAL | 0 refills | Status: DC | PRN

## 2024-01-16 MED ORDER — CEFUROXIME AXETIL 500 MG PO TABS: 500.0000 mg | ORAL_TABLET | Freq: Two times a day (BID) | ORAL | 0 refills | Status: AC

## 2024-01-16 MED ORDER — CEFUROXIME AXETIL 500 MG PO TABS
500.0000 mg | ORAL_TABLET | Freq: Two times a day (BID) | ORAL | Status: DC
  Filled 2024-01-16: qty 1

## 2024-01-16 MED ORDER — BENZONATATE 100 MG PO CAPS: 100.0000 mg | ORAL_CAPSULE | Freq: Three times a day (TID) | ORAL | 0 refills | Status: DC | PRN

## 2024-01-16 MED ORDER — MORPHINE SULFATE (CONCENTRATE) 10 MG /0.5 ML PO SOLN: 5.0000 mg | ORAL | 0 refills | Status: DC | PRN

## 2024-01-16 MED ORDER — ENSURE ENLIVE PO LIQD: 237.0000 mL | Freq: Two times a day (BID) | ORAL | 12 refills | Status: DC

## 2024-01-16 MED ORDER — IPRATROPIUM-ALBUTEROL 0.5-2.5 (3) MG/3ML IN SOLN: 3.0000 mL | Freq: Three times a day (TID) | RESPIRATORY_TRACT | 1 refills | Status: DC

## 2024-01-16 MED ORDER — AZITHROMYCIN 500 MG PO TABS: 500.0000 mg | ORAL_TABLET | Freq: Every day | ORAL | 0 refills | Status: AC

## 2024-01-16 NOTE — Progress Notes (Addendum)
   [facility] Liaison   Patient will discharge home today via private vehicle.  [facility] to deliver portable 02 to the room so that wife can transport home.  DME to include a hospital bed, over bed table, home 02, bsc and shower chair will be delivered by2pm today.  Initial nursing visit scheduled for 9am tomorrow.  Spouse and hospital team aware and agreeable.        Please call with any Hospice related questions or concerns.     Thank you for the opportunity to participate in this patient's care.   [facility] Liaison [phone]

## 2024-01-16 NOTE — Discharge Summary (Signed)
 Physician Discharge Summary   Patient: Joe Morton MRN: [id number] DOB: Jun 03, 1951  Admit date:     01/12/2024  Discharge date: 01/16/24  Discharge Physician: Enedina Finner   PCP: Emilio Aspen, MD   Recommendations at discharge:    Hospice to follow at home F/u PCP in 1-2 weeks  Discharge Diagnoses: Principal Problem:   Pneumonia Active Problems:   CAP (community acquired pneumonia)   Hypercalcemia   Rib fracture   Sepsis [facility])   Palliative care encounter   Pleural effusion, left   S/P thoracentesis   Joe Morton is a 73 y.o. 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    CT chest and abdomen on February 18 1. No acute intrathoracic, intra-abdominal, intrapelvic traumatic injury. 2. No acute fracture or traumatic malalignment of the thoracic or lumbar spine. 3. Interval increase in small volume left pleural effusion. 4. Interval increase in left upper lobe consolidation and ground-glass airspace opacity with narrowing of the bronchial and surrounding a left hilar density consistent with known malignancy better evaluated on PET CT 12/08/2023. Superimposed infection not excluded. 5. Heterogeneous lytic appendicular and axial skeleton consistent with metastasis. 6. Aortic Atherosclerosis (ICD10-I70.0) and Emphysema (ICD10-J43.9).    Sepsis due to left upper lobe pneumonia/post obstructive left lung mass left pleural effusion -- came in with tachycardia, elevated white count, left upper lobe pneumonia -- IV Rocephin and Zithromax -- chest physiotherapy, flutter valve, incentive spirometer and nebulizer -- patient coughing up pink frothy sputum -- IR consulted for ultrasound-guided  thoracentesis -- blood culture negative -- sputum culture no organisms seen. 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 Francella Solian -- pleural fluid is positive for malignant cells --pt and  family would like Hospice to follow at home   Hypercalcemia -- came in with calcium of 11.1-- 10.6--- IV fluids-- 9.6   Protein calorie malnutrition moderate -- patient has been followed by  dietitian at the cancer center   Generalized weakness with recent fall -- seen by PT. Will arrange home health PT   Right lower extremity DVT -- continue Xarelto   Confusion/hallucination/insomnia -- CT had done last night showed stroke which was similar to previous CT head. Extensive skeletal mental stasis noted -- will resume Remeron -- PO RN trazodone   Long term prognosis poor.   D/w pt and wife today. Agreeable for d/c to home with Hospice   Family communication : wife at bedside Consults : IR, oncology CODE STATUS: DNR DVT Prophylaxis : Xarelto     Pain control - [place] Controlled Substance Reporting System database was reviewed. and patient was instructed, not to drive, operate heavy machinery, perform activities at heights, swimming or participation in water activities or provide baby-sitting services while on Pain, Sleep and Anxiety Medications; until their outpatient Physician has advised to do so again. Also recommended to not to take more than prescribed Pain, Sleep and Anxiety Medications.   Procedures performed: left side thoracentesis  Disposition: Homewith Hospice Diet recommendation:  Discharge Diet Orders (From admission, onward)     Start     Ordered   01/16/24 0000  Diet - low  sodium heart healthy        01/16/24 0950            DISCHARGE MEDICATION: Allergies as of 01/16/2024   No Known Allergies       Medication List     STOP taking these medications    amoxicillin-clavulanate 875-125 MG tablet Commonly known as: AUGMENTIN   HYDROcodone-acetaminophen 5-325 MG tablet Commonly known as: Norco   Tagrisso 80 MG tablet Generic drug: osimertinib mesylate       TAKE these medications    Acetaminophen 500 MG capsule Take 500 mg by mouth every 6 (six) hours as needed for mild pain (pain score 1-3) or moderate pain (pain score 4-6).   azithromycin 500 MG tablet Commonly known as: ZITHROMAX Take 1 tablet (500 mg total) by mouth daily for 4 days. Start taking on: January 17, 2024 What changed:  medication strength how much to take how to take this when to take this additional instructions   benzonatate 100 MG capsule Commonly known as: TESSALON Take 1 capsule (100 mg total) by mouth 3 (three) times daily as needed for cough.   cefUROXime 500 MG tablet Commonly known as: CEFTIN Take 1 tablet (500 mg total) by mouth 2 (two) times daily with a meal for 7 days.   chlorpheniramine-HYDROcodone 10-8 MG/5ML Commonly known as: TUSSIONEX Take 5 mLs by mouth at bedtime as needed for cough.   feeding supplement Liqd Take 237 mLs by mouth 2 (two) times daily between meals.   guaiFENesin 600 MG 12 hr tablet Commonly known as: MUCINEX Take 2 tablets (1,200 mg total) by mouth 2 (two) times daily for 7 days.   ipratropium-albuterol 0.5-2.5 (3) MG/3ML Soln Commonly known as: DUONEB Take 3 mLs by nebulization 3 (three) times daily.   lactulose 10 GM/15ML solution Commonly known as: CHRONULAC Take 15-30 mLs (10-20 g total) by mouth 2 (two) times daily as needed for mild constipation.   lidocaine 5 % Commonly known as: Lidoderm Place 1 patch onto the skin every 12 (twelve) hours. Remove & Discard patch within 12 hours or as directed by MD   megestrol 40 MG tablet Commonly known as: MEGACE Take 2 tablets (80 mg total) by mouth 2 (two) times daily.   mirtazapine 7.5 MG  tablet Commonly known as: REMERON Take 1 tablet (7.5 mg total) by mouth at bedtime.   morphine CONCENTRATE 10 mg / 0.5 ml concentrated solution Take 0.25 mLs (5 mg total) by mouth every 2 (two) hours as needed for moderate pain (pain score 4-6) or shortness of breath.   ondansetron 8 MG tablet Commonly known as: ZOFRAN Take 1 tablet (8 mg total) by mouth every 8 (eight) hours as needed for nausea or vomiting.   traZODone 50 MG tablet Commonly known as: DESYREL Take 0.5 tablets (25 mg total) by mouth at bedtime as needed for sleep.   Xarelto Starter Pack Generic drug: Rivaroxaban Starter Pack (15 mg and 20 mg) Take 15mg  by mouth 2 (two) times daily for 21 days, then take 20mg  by mouth daily for 9 days.   rivaroxaban 20 MG Tabs tablet Commonly known as: XARELTO Take 1 tablet (20 mg total) by mouth daily. For three weeks after taking 15 mg twice a day for three weeks               Durable Medical Equipment  (From admission, onward)           Start     Ordered   01/16/24  0000  For home use only DME Nebulizer machine       Question Answer Comment  Patient needs a nebulizer to treat with the following condition COPD (chronic obstructive pulmonary disease) (HCC)   Length of Need Lifetime   Additional equipment included Administration kit      01/16/24 0950            Follow-up Information     Emilio Aspen, MD. Schedule an appointment as soon as possible for a visit in 1 week(s).   Specialty: Internal Medicine Contact information: [address] [place] [address] [phone]         Rickard Patience, MD. Go to.   Specialty: Oncology Why: on your next appt Contact information: [address] [place] [address] [phone]                Discharge Exam: Ceasar Mons Weights   01/12/24 1419  Weight: 67.1 kg   GENERAL:  73 y.o.-year-old patient with no acute distress. Appears ill, frail thin LUNGS: distant breath sounds  bilaterally, coarse breath sounds CARDIOVASCULAR: S1, S2 normal. tachycardia ABDOMEN: Soft, nontender, nondistended.  EXTREMITIES: No  edema b/l.    NEUROLOGIC: nonfocal  patient is alert and awake, weak  Condition at discharge: poor  The results of significant diagnostics from this hospitalization (including imaging, microbiology, ancillary and laboratory) are listed below for reference.   Imaging Studies: US THORACENTESIS ASP PLEURAL SPACE W/IMG GUIDE Result Date: 01/14/2024 INDICATION: Pneumonia, metastatic lung carcinoma, left pleural effusion EXAM: ULTRASOUND GUIDED LEFT THORACENTESIS MEDICATIONS: Lidocaine 1% subcutaneous COMPLICATIONS: None immediate.  No pneumothorax on follow-up chest radiograph. PROCEDURE: An ultrasound guided thoracentesis was thoroughly discussed with the patient and questions answered. The benefits, risks, alternatives and complications were also discussed. The patient understands and wishes to proceed with the procedure. Written consent was obtained. Ultrasound was performed to localize and mark an adequate pocket of fluid in the left chest. The area was then prepped and draped in the normal sterile fashion. 1% Lidocaine was used for local anesthesia. Under ultrasound guidance a 6 Fr Safe-T-Centesis catheter was introduced. Thoracentesis was performed. The catheter was removed and a dressing applied. FINDINGS: A total of approximately 500 mL of cloudy yellow fluid was removed. Samples were sent to the laboratory as requested by the clinical team. IMPRESSION: Successful ultrasound guided left thoracentesis yielding 500 mL of pleural fluid. Electronically Signed   By: Corlis Leak M.D.   On: 01/14/2024 11:24   DG Chest Port 1 View Result Date: 01/14/2024 CLINICAL DATA:  Pleural effusion EXAM: PORTABLE CHEST 1 VIEW COMPARISON:  CT 01/09/2024 FINDINGS: Normal cardiac silhouette. Dense LEFT upper lobe pneumonia again noted. Small LEFT effusion is decreased in volume. RIGHT lung  relatively clear. No pneumothorax. IMPRESSION: 1. Persistent LEFT upper lobe pneumonia. 2. Improvement in LEFT pleural effusion. Electronically Signed   By: Genevive Bi M.D.   On: 01/14/2024 10:36   CT HEAD WO CONTRAST ( ) Result Date: 01/14/2024 CLINICAL DATA:  73 year old male with altered mental status. Lung cancer. Recent MRI evidence of embolic appearing cerebral and cerebellar infarcts. Skeletal metastases. EXAM: CT HEAD WITHOUT CONTRAST TECHNIQUE: Contiguous axial images were obtained from the base of the skull through the vertex without intravenous contrast. RADIATION DOSE REDUCTION: This exam was performed according to the departmental dose-optimization program which includes automated exposure control, adjustment of the mA and/or kV according to patient size and/or use of iterative reconstruction technique. COMPARISON:  Recent brain MRI 12/30/2023.  Head  CT 01/02/2024. FINDINGS: Brain: Normal background cerebral volume for age. No ventriculomegaly. Cavum septum pellucidum. No midline shift. New wedge-shaped left cerebellar infarcts/edema on series 2, image 9, was negative on the MRI earlier this month. This is AICA territory. No associated hemorrhage or mass effect. Elsewhere gray-white differentiation appears stable, with regression of left middle frontal gyrus vasogenic edema (series 2, image 25). No other areas of new or increased edema. No acute intracranial hemorrhage identified. No intracranial mass effect or discrete mass lesion. Vascular: Calcified atherosclerosis at the skull base. No suspicious intracranial vascular hyperdensity. Skull: Widely scattered calvarium lucent lesions, most numerous at the vertex. Heterogeneous clivus. Heterogeneous visible upper cervical vertebrae. Known bone metastases. No acute fracture identified. Sinuses/Orbits: Visualized paranasal sinuses and mastoids are stable and well aerated. Other: No acute orbit or scalp soft tissue finding. IMPRESSION: 1. Patchy  new Left cerebellar AICA infarct since 01/02/2024. No associated hemorrhage or mass effect. 2. Otherwise stable, evolved small infarcts demonstrated on MRI 12/30/2023. 3. 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 Electronically Signed   By: Odessa Fleming M.D.   On: 01/14/2024 06:56   DG Chest 1 View Result Date: 01/12/2024 CLINICAL DATA:  Pneumonia, known metastatic lung cancer EXAM: CHEST  1 VIEW COMPARISON:  01/09/2024 FINDINGS: Two frontal views of the chest demonstrate a stable cardiac silhouette. Hazy opacity within the left hemithorax consistent with the left upper lobe consolidation and underlying mass as identified on multiple prior CT and PET scans. Chronic elevation of the left hemidiaphragm. Stable left pleural effusion. Right chest is clear. No acute fractures. IMPRESSION: 1. Continued left upper lobe consolidation compatible with known left hilar mass and postobstructive changes as seen on multiple prior exams. 2. Chronic elevation of the left hemidiaphragm, with stable left pleural effusion. Electronically Signed   By: Sharlet Salina M.D.   On: 01/12/2024 16:27   CT CHEST ABDOMEN PELVIS W CONTRAST Result Date: 01/10/2024 CLINICAL DATA:  Chest trauma, blunt. right rib pain after falling this morning. EXAM: CT CHEST, ABDOMEN, AND PELVIS WITH CONTRAST TECHNIQUE: Multidetector CT imaging of the chest, abdomen and pelvis was performed following the standard protocol during bolus administration of intravenous contrast. RADIATION DOSE REDUCTION: This exam was performed according to the departmental dose-optimization program which includes automated exposure control, adjustment of the mA and/or kV according to patient size and/or use of iterative reconstruction technique. CONTRAST:  OMNIPAQUE IOHEXOL 300 MG/ML  SOLN COMPARISON:  PET CT 12/08/2023 FINDINGS: CHEST: Cardiovascular: No aortic injury. The thoracic aorta is normal in caliber. The heart is normal in size. Slightly increased in  size trace pericardial effusion. Mediastinum/Nodes: No pneumomediastinum. No mediastinal hematoma. The esophagus is unremarkable. The thyroid is unremarkable. The central airways are patent. No mediastinal, hilar, or axillary lymphadenopathy. Lungs/Pleura: Centrilobular emphysematous changes. Interval increase in left upper lobe consolidation and ground-glass airspace opacity with narrowing of the bronchial and surrounding a left hilar density consistent with known malignancy better evaluated on PET CT 12/08/2023. No pulmonary nodule. No pulmonary mass. No pulmonary contusion or laceration. No pneumatocele formation. Interval increase in small volume left pleural effusion. No right pleural effusion. No pneumothorax. No hemothorax. Musculoskeletal/Chest wall: No chest wall mass. Old healed right rib fractures. No acute rib or sternal fracture. No spinal fracture. ABDOMEN / PELVIS: Hepatobiliary: Not enlarged. No focal lesion. No laceration or subcapsular hematoma. The gallbladder is otherwise unremarkable with no radio-opaque gallstones. No biliary ductal dilatation. Pancreas: Normal pancreatic contour. No main pancreatic duct dilatation. Spleen: Not enlarged. No focal lesion.  No laceration, subcapsular hematoma, or vascular injury. Adrenals/Urinary Tract: No nodularity bilaterally. Bilateral kidneys enhance symmetrically. No hydronephrosis. No contusion, laceration, or subcapsular hematoma. No injury to the vascular structures or collecting systems. No hydroureter. The urinary bladder is unremarkable. On delayed imaging, there is no urothelial wall thickening and there are no filling defects in the opacified portions of the bilateral collecting systems or ureters. Stomach/Bowel: No small or large bowel wall thickening or dilatation. The appendix is unremarkable. Vasculature/Lymphatics: No abdominal aorta or iliac aneurysm. No active contrast extravasation or pseudoaneurysm. No abdominal, pelvic, inguinal  lymphadenopathy. Reproductive: Normal. Other: No simple free fluid ascites. No pneumoperitoneum. No hemoperitoneum. No mesenteric hematoma identified. No organized fluid collection. Musculoskeletal: No significant soft tissue hematoma. No acute pelvic fracture. No spinal fracture. Heterogeneous lytic appendicular and axial skeleton consistent with metastasis. Multilevel degenerative changes of the spine. Ports and Devices: None. IMPRESSION: 1. No acute intrathoracic, intra-abdominal, intrapelvic traumatic injury. 2. No acute fracture or traumatic malalignment of the thoracic or lumbar spine. 3. Interval increase in small volume left pleural effusion. 4. Interval increase in left upper lobe consolidation and ground-glass airspace opacity with narrowing of the bronchial and surrounding a left hilar density consistent with known malignancy better evaluated on PET CT 12/08/2023. Superimposed infection not excluded. 5. Heterogeneous lytic appendicular and axial skeleton consistent with metastasis. 6. Aortic Atherosclerosis (ICD10-I70.0) and Emphysema (ICD10-J43.9). Electronically Signed   By: Tish Frederickson M.D.   On: 01/10/2024 00:06   DG Ribs Unilateral W/Chest Right Result Date: 01/09/2024 CLINICAL DATA:  Recent fall with right-sided rib pain, initial encounter EXAM: RIGHT RIBS AND CHEST - 3+ VIEW COMPARISON:  12/21/2023 FINDINGS: Cardiac shadow is stable. Persistent left perihilar mass lesion with left upper lobe consolidation is noted and slightly worsened in the interval from the prior exam. Minimal left pleural effusion is noted. Right lung is clear. No pneumothorax is seen. Fractures involving the right seventh rib posteriorly and laterally are seen. No complicating factors are noted. IMPRESSION: Right seventh rib fracture without complicating factors. Electronically Signed   By: Alcide Clever M.D.   On: 01/09/2024 22:10   CT ANGIO HEAD NECK W WO CM Result Date: 01/02/2024 CLINICAL DATA:  Stroke/TIA,  determine embolic source EXAM: CT ANGIOGRAPHY HEAD AND NECK WITH AND WITHOUT CONTRAST TECHNIQUE: Multidetector CT imaging of the head and neck was performed using the standard protocol during bolus administration of intravenous contrast. Multiplanar CT image reconstructions and MIPs were obtained to evaluate the vascular anatomy. Carotid stenosis measurements (when applicable) are obtained utilizing NASCET criteria, using the distal internal carotid diameter as the denominator. RADIATION DOSE REDUCTION: This exam was performed according to the departmental dose-optimization program which includes automated exposure control, adjustment of the mA and/or kV according to patient size and/or use of iterative reconstruction technique. CONTRAST:  75mL OMNIPAQUE IOHEXOL 350 MG/ML SOLN COMPARISON:  None Available. FINDINGS: CT HEAD FINDINGS Brain: Known acute infarcts better characterized on recent MRI. No progressive mass effect or acute hemorrhage. No midline shift. No hydrocephalus. Skull: Known metastases better seen on recent MRI. Sinuses/orbits: No acute abnormality. CTA NECK FINDINGS Aortic arch: Great vessel origins are patent without significant stenosis. Right carotid system: Atherosclerosis at the carotid bifurcation without greater than 50% stenosis. Left carotid system: Atherosclerosis at the carotid bifurcation without greater than 50% stenosis. Vertebral arteries: Right-dominant. No evidence of dissection, stenosis (50% or greater), or occlusion. Skeleton: No acute abnormality on limited assessment. Other neck: No acute abnormality on limited assessment. Upper chest: Partially imaged left upper  lobe consolidation. Partially imaged layering left greater than right pleural effusions. Review of the MIP images confirms the above findings CTA HEAD FINDINGS Anterior circulation: Bilateral intracranial ICAs, MCAs, and ACAs are patent without proximal hemodynamically significant stenosis. Posterior circulation:  Bilateral intradural vertebral arteries, basilar artery and bilateral posterior cerebral arteries are patent without proximal hemodynamically significant stenosis. Venous sinuses: As permitted by contrast timing, patent. Review of the MIP images confirms the above findings IMPRESSION: 1. No large vessel occlusion or proximal hemodynamically significant stenosis. 2. Partially imaged left upper lobe consolidation, compatible with pneumonia that may be post-obstructive given known mass seen on prior PET-CT. A CT of the chest could further characterize if clinically warranted. 3. Partially imaged layering left greater than right pleural effusions. Electronically Signed   By: Feliberto Harts M.D.   On: 01/02/2024 19:15   ECHOCARDIOGRAM COMPLETE Result Date: 01/02/2024    ECHOCARDIOGRAM REPORT   Patient Name:   [facility]CKELVIN [facility] Date of Exam: 01/02/2024 Medical Rec #:  [id number]   Height:       68.0 in Accession #:    [id number]  Weight:       153.6 lb Date of Birth:  May 04, 1951    BSA:          1.827 m Patient Age:    73 years    BP:           94/70 mmHg Patient Gender: M           HR:           80 bpm. Exam Location:  [facility] Procedure: 2D Echo, Cardiac Doppler and Color Doppler Indications:     Stroke I63.9  History:         Patient has no prior history of Echocardiogram examinations.                  Stroke.  Sonographer:     Lucendia Herrlich RCS Referring Phys:  [id number] Charise Killian Diagnosing Phys: Yvonne Kendall MD IMPRESSIONS  1. Left ventricular ejection fraction, by estimation, is 55 to 60%. The left ventricle has normal function. The left ventricle has no regional wall motion abnormalities. Left ventricular diastolic parameters were normal.  2. Right ventricular systolic function is normal. The right ventricular size is normal. There is normal pulmonary artery systolic pressure.  3. Right atrial size was mildly dilated.  4. The mitral valve is normal in structure. Moderate mitral valve regurgitation. No  evidence of mitral stenosis.  5. Tricuspid valve regurgitation is mild to moderate.  6. The aortic valve has an indeterminant number of cusps. Aortic valve regurgitation is trivial. No aortic stenosis is present.  7. Aortic dilatation noted. There is borderline dilatation of the aortic root, measuring 38 mm. There is mild dilatation of the ascending aorta, measuring 38 mm.  8. The inferior vena cava is normal in size with greater than 50% respiratory variability, suggesting right atrial pressure of 3 mmHg. FINDINGS  Left Ventricle: Left ventricular ejection fraction, by estimation, is 55 to 60%. The left ventricle has normal function. The left ventricle has no regional wall motion abnormalities. The left ventricular internal cavity size was normal in size. There is  no left ventricular hypertrophy. Left ventricular diastolic parameters were normal. Right Ventricle: The right ventricular size is normal. No increase in right ventricular wall thickness. Right ventricular systolic function is normal. There is normal pulmonary artery systolic pressure. The tricuspid regurgitant velocity is 2.78 m/s, and  with an assumed right  atrial pressure of 3 mmHg, the estimated right ventricular systolic pressure is 33.9 mmHg. Left Atrium: Left atrial size was normal in size. Right Atrium: Right atrial size was mildly dilated. Pericardium: Trivial pericardial effusion is present. Mitral Valve: The mitral valve is normal in structure. Mild mitral annular calcification. Moderate mitral valve regurgitation. No evidence of mitral valve stenosis. Tricuspid Valve: The tricuspid valve is normal in structure. Tricuspid valve regurgitation is mild to moderate. Aortic Valve: The aortic valve has an indeterminant number of cusps. Aortic valve regurgitation is trivial. No aortic stenosis is present. Aortic valve peak gradient measures 11.0 mmHg. Pulmonic Valve: The pulmonic valve was not well visualized. Pulmonic valve regurgitation is mild. No  evidence of pulmonic stenosis. Aorta: Aortic dilatation noted. There is borderline dilatation of the aortic root, measuring 38 mm. There is mild dilatation of the ascending aorta, measuring 38 mm. Pulmonary Artery: The pulmonary artery is not well seen. Venous: The inferior vena cava is normal in size with greater than 50% respiratory variability, suggesting right atrial pressure of 3 mmHg. IAS/Shunts: The interatrial septum was not well visualized.  LEFT VENTRICLE PLAX 2D LVIDd:         5.20 cm   Diastology LVIDs:         3.70 cm   LV e' medial:    9.14 cm/s LV PW:         0.90 cm   LV E/e' medial:  11.4 LV IVS:        0.90 cm   LV e' lateral:   12.80 cm/s LVOT diam:     2.20 cm   LV E/e' lateral: 8.1 LV SV:         78 LV SV Index:   42 LVOT Area:     3.80 cm  RIGHT VENTRICLE             IVC RV S prime:     23.20 cm/s  IVC diam: 1.50 cm TAPSE (M-mode): 3.1 cm LEFT ATRIUM             Index        RIGHT ATRIUM           Index LA diam:        3.50 cm 1.92 cm/m   RA Area:     21.40 cm LA Vol (A2C):   47.8 ml 26.17 ml/m  RA Volume:   73.90 ml  40.45 ml/m LA Vol (A4C):   31.0 ml 16.94 ml/m LA Biplane Vol: 36.6 ml 20.03 ml/m  AORTIC VALVE AV Area (Vmax): 2.66 cm AV Vmax:        166.00 cm/s AV Peak Grad:   11.0 mmHg LVOT Vmax:      116.00 cm/s LVOT Vmean:     76.600 cm/s LVOT VTI:       0.204 m  AORTA Ao Root diam: 3.80 cm Ao Asc diam:  3.80 cm MITRAL VALVE                TRICUSPID VALVE MV Area (PHT): 3.48 cm     TR Peak grad:   30.9 mmHg MV Decel Time: 218 msec     TR Vmax:        278.00 cm/s MR Peak grad: 78.8 mmHg MR Vmax:      443.75 cm/s   SHUNTS MV E velocity: 104.00 cm/s  Systemic VTI:  0.20 m MV A velocity: 88.60 cm/s   Systemic Diam: 2.20 cm MV E/A ratio:  1.17 Cristal Deer  End MD Electronically signed by Yvonne Kendall MD Signature Date/Time: 01/02/2024/6:29:56 PM    Final    MR Brain W Wo Contrast Result Date: 12/30/2023 CLINICAL DATA:  Non-small cell lung cancer, staging. EXAM: MRI HEAD WITHOUT AND  WITH CONTRAST TECHNIQUE: Multiplanar, multiecho pulse sequences of the brain and surrounding structures were obtained without and with intravenous contrast. CONTRAST:  7mL GADAVIST GADOBUTROL 1 MMOL/ML IV SOLN COMPARISON:  CT head without contrast 12/28/2023. 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 Additional punctate cortical infarcts are present just superior to the largest area within the pre frontal cortex. Three separate punctate white matter scratched at 3 separate punctate acute white matter infarcts are present in the right corona radiata. A punctate area of acute infarction is present in the left lentiform nucleus. A punctate acute cortical infarct is present the left occipital pole. A 5 mm acute nonhemorrhagic infarct is present in the inferior right cerebellum. A punctate infarct is present inferiorly in the left cerebellum. Vascular: Flow is present in the major intracranial arteries. Skull and upper cervical spine: Craniocervical junction is normal. 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 No pathologic fractures are present. Sinuses/Orbits: The paranasal sinuses and mastoid air cells are clear. The globes and orbits are within normal limits. IMPRESSION: 1. 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 2. Additional small infarcts involving the more superior left frontal lobe, right corona radiata, left basal ganglia and bilateral cerebellum. This suggests a central source. 3. 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 No pathologic fractures are present. Electronically Signed   By: Marin Roberts M.D.   On: 12/30/2023 19:42   CT HEAD W & WO CONTRAST ( ) Result Date: 12/28/2023 CLINICAL DATA:  Brain metastases suspected. Newly diagnosed lung cancer. Headaches.  EXAM: CT HEAD WITHOUT AND WITH CONTRAST TECHNIQUE: Contiguous axial images were obtained from the base of the skull through the vertex without and with intravenous contrast. RADIATION DOSE REDUCTION: This exam was performed according to the departmental dose-optimization program which includes automated exposure control, adjustment of the mA and/or kV according to patient size and/or use of iterative reconstruction technique. CONTRAST:  75mL OMNIPAQUE IOHEXOL 300 MG/ML  SOLN COMPARISON:  None Available. FINDINGS: Brain: There is no evidence of an acute infarct, intracranial hemorrhage, mass, midline shift, or extra-axial fluid collection. Cerebral volume is within normal limits for age. The ventricles are normal in size. Cerebral white matter hypodensities are nonspecific but compatible with mild chronic small vessel ischemic disease. No abnormal enhancement is identified. Vascular: The dural venous sinuses and large intracranial arteries are grossly patent. Skull: No fracture.  Scattered small skull lesions. Sinuses/Orbits: Visualized paranasal sinuses and mastoid air cells are clear. Unremarkable orbits. Other: None. IMPRESSION: 1. No evidence of intracranial metastases. 2. Scattered small skull lesions, suspicious for metastases given evidence of widespread osseous metastases on last month's PET-CT. 3. Mild chronic small vessel ischemic disease. Electronically Signed   By: Sebastian Ache M.D.   On: 12/28/2023 15:13   US Venous Img Lower Unilateral Right Result Date: 12/28/2023 CLINICAL DATA:  RLE edema, calf pain, lung cancer EXAM: RIGHT LOWER EXTREMITY VENOUS DOPPLER ULTRASOUND TECHNIQUE: Gray-scale sonography with compression, as well as color and duplex ultrasound, were performed to evaluate the deep venous system(s) from the level of the common femoral vein through the popliteal and proximal calf  veins. COMPARISON:  PET-CT, 12/08/2023 FINDINGS: VENOUS Normal compressibility of the common femoral, superficial  femoral, as well as the visualized calf veins. Visualized portions of profunda femoral vein and great saphenous vein unremarkable. Heterogeneously-echogenic, occlusive filling defects within and noncompressibility of the imaged portions of the RIGHT popliteal as well as the imaged calf veins. Limited views of the contralateral common femoral vein are unremarkable. OTHER No evidence of superficial thrombophlebitis or abnormal fluid collection. Limitations: none IMPRESSION: Acute RIGHT lower extremity DVT within the popliteal and calf veins. Roanna Banning, MD [facility] Electronically Signed   By: Roanna Banning M.D.   On: 12/28/2023 12:12   DG Chest Port 1 View Result Date: 12/21/2023 CLINICAL DATA:  Status post bronchoscopy. EXAM: PORTABLE CHEST 1 VIEW COMPARISON:  September 10, 2018.  December 08, 2023. FINDINGS: Normal cardiac size. Left perihilar and upper lobe opacity is noted concerning for atelectasis or infiltrate and underlying neoplasm. No pneumothorax is noted status post bronchoscopy. Right lungs clear. IMPRESSION: Left perihilar and upper lobe opacities noted concerning for atelectasis or infiltrate and underlying neoplasm as noted on prior CT scan. No pneumothorax status post bronchoscopy. Electronically Signed   By: Lupita Raider M.D.   On: 12/21/2023 10:25   DG C-ARM BRONCHOSCOPY Result Date: 12/21/2023 C-ARM BRONCHOSCOPY: Fluoroscopy was utilized by the requesting physician.  No radiographic interpretation.    Microbiology: Results for orders placed or performed during the hospital encounter of 01/12/24  Culture, blood (Routine x 2)     Status: None (Preliminary result)   Collection Time: 01/12/24  2:22 PM   Specimen: BLOOD  Result Value Ref Range Status   Specimen Description BLOOD LEFT ANTECUBITAL  Final   Special Requests   Final    BOTTLES DRAWN AEROBIC AND ANAEROBIC Blood Culture adequate volume   Culture   Final    NO  GROWTH 4 DAYS Performed at [facility], [address], [place], [place] [address]    Report Status PENDING  Incomplete  Culture, blood (Routine x 2)     Status: None (Preliminary result)   Collection Time: 01/12/24  3:46 PM   Specimen: BLOOD LEFT FOREARM  Result Value Ref Range Status   Specimen Description BLOOD LEFT FOREARM  Final   Special Requests   Final    BOTTLES DRAWN AEROBIC AND ANAEROBIC Blood Culture results may not be optimal due to an inadequate volume of blood received in culture bottles   Culture   Final    NO GROWTH 4 DAYS Performed at [facility], [address] [place], [place] [address]    Report Status PENDING  Incomplete  Expectorated Sputum Assessment w Gram Stain, Rflx to Resp Cult     Status: None   Collection Time: 01/13/24  8:33 AM   Specimen: Sputum  Result Value Ref Range Status   Specimen Description SPUTUM  Final   Special Requests NONE  Final   Sputum evaluation   Final    THIS SPECIMEN IS ACCEPTABLE FOR SPUTUM CULTURE Performed at [facility], [address] [place], [place] [address]    Report Status 01/13/2024 FINAL  Final  Culture, Respiratory w Gram Stain     Status: None (Preliminary result)   Collection Time: 01/13/24  8:33 AM   Specimen: SPU  Result Value Ref Range Status   Specimen Description   Final    SPUTUM Performed at [facility], [address] [place], [place] [address]  Special Requests   Final    NONE Reflexed from [id number] Performed at [facility], [address] [place], [place] [address]    Gram Stain   Final    RARE SQUAMOUS EPITHELIAL CELLS PRESENT NO ORGANISMS SEEN MODERATE WBC PRESENT, PREDOMINANTLY MONONUCLEAR    Culture   Final    RARE YEAST CULTURE REINCUBATED FOR BETTER GROWTH Performed at [facility], [address] [place], [place] [address]    Report Status PENDING  Incomplete  Body fluid culture w Gram Stain     Status: None  (Preliminary result)   Collection Time: 01/14/24 10:12 AM   Specimen: PATH Cytology Pleural fluid  Result Value Ref Range Status   Specimen Description   Final    PLEURAL Performed at [facility], [address], [place], [place] [address]    Special Requests   Final    PLEURAL Performed at [facility], [address] [place], [place] [address]    Gram Stain NO WBC SEEN NO ORGANISMS SEEN   Final   Culture   Final    NO GROWTH 2 DAYS Performed at [facility], [address] [place], [place] [address]    Report Status PENDING  Incomplete    Labs: CBC: Recent Labs  Lab 01/09/24 2253 01/12/24 1420  WBC 12.7* 13.0*  NEUTROABS 9.8* 9.8*  HGB 15.8 16.2  HCT 46.2 48.5  MCV 92.2 92.7  PLT 98* 63*   Basic Metabolic Panel: Recent Labs  Lab 01/09/24 2253 01/12/24 1420 01/13/24 0439  NA 138 138 140  K 3.7 4.1 3.8  CL 103 101 109  CO2 21* 21* 19*  GLUCOSE 156* 108* 96  BUN 26* 23 20  CREATININE 1.14 1.31* 1.04  CALCIUM 11.1* 10.4* 9.6   Liver Function Tests: Recent Labs  Lab 01/09/24 2253 01/12/24 1420  AST 51* 41  ALT 27 23  ALKPHOS 1,088* 1,322*  BILITOT 0.9 1.0  PROT 6.8 7.2  ALBUMIN 3.1* 3.3*    Discharge time spent: greater than 30 minutes.  Signed: Enedina Finner, MD [facility] 01/16/2024

## 2024-01-16 NOTE — Care Management Important Message (Signed)
 Important Message  Patient Details  Name: Joe Morton MRN: [id number] Date of Birth: [birth year]   Important Message Given:  Yes - Medicare IM     Cristela Blue, CMA 01/16/2024, 9:51 AM

## 2024-01-17 ENCOUNTER — Inpatient Hospital Stay (HOSPITAL_BASED_OUTPATIENT_CLINIC_OR_DEPARTMENT_OTHER): Payer: Medicare Other | Admitting: Hospice and Palliative Medicine

## 2024-01-17 DIAGNOSIS — C3412 Malignant neoplasm of upper lobe, left bronchus or lung: Secondary | ICD-10-CM

## 2024-01-17 LAB — BODY FLUID CULTURE W GRAM STAIN: Gram Stain: NONE SEEN

## 2024-01-17 LAB — CULTURE, BLOOD (ROUTINE X 2)
Culture: NO GROWTH
Culture: NO GROWTH
Special Requests: ADEQUATE

## 2024-01-17 NOTE — Progress Notes (Signed)
 Multidisciplinary Oncology Council Documentation  Joe Morton was presented by our [facility] on 01/17/2024, which included representatives from:  Palliative Care Dietitian  Physical/Occupational Therapist Nurse Navigator Genetics Social work Survivorship RN Financial Navigator Research RN   Joe Morton currently presents with history of lung cancer  We reviewed previous medical and familial history, history of present illness, and recent lab results along with all available histopathologic and imaging studies. The MOC considered available treatment options and made the following recommendations/referrals:  N/A  The MOC is a meeting of clinicians from various specialty areas who evaluate and discuss patients for whom a multidisciplinary approach is being considered. Final determinations in the plan of care are those of the provider(s).   Today's extended care, comprehensive team conference, Joe Morton was not present for the discussion and was not examined.

## 2024-01-19 ENCOUNTER — Other Ambulatory Visit: Payer: Self-pay | Admitting: *Deleted

## 2024-01-19 DIAGNOSIS — C3412 Malignant neoplasm of upper lobe, left bronchus or lung: Secondary | ICD-10-CM

## 2024-01-22 ENCOUNTER — Inpatient Hospital Stay: Payer: Medicare Other | Attending: Oncology

## 2024-01-22 ENCOUNTER — Ambulatory Visit: Payer: Medicare Other | Admitting: Pharmacist

## 2024-01-22 ENCOUNTER — Ambulatory Visit: Payer: Medicare Other | Admitting: Oncology

## 2024-01-22 ENCOUNTER — Encounter: Payer: Self-pay | Admitting: Internal Medicine

## 2024-01-22 ENCOUNTER — Other Ambulatory Visit: Payer: Medicare Other

## 2024-01-22 ENCOUNTER — Inpatient Hospital Stay: Payer: Medicare Other | Admitting: Oncology

## 2024-01-22 ENCOUNTER — Inpatient Hospital Stay: Payer: Medicare Other | Admitting: Pharmacist

## 2024-01-22 DIAGNOSIS — R0689 Other abnormalities of breathing: Secondary | ICD-10-CM

## 2024-01-22 DIAGNOSIS — R059 Cough, unspecified: Secondary | ICD-10-CM

## 2024-01-22 DIAGNOSIS — R0600 Dyspnea, unspecified: Secondary | ICD-10-CM

## 2024-01-23 ENCOUNTER — Other Ambulatory Visit: Payer: Self-pay | Admitting: Hematology

## 2024-01-23 DIAGNOSIS — R058 Other specified cough: Secondary | ICD-10-CM

## 2024-01-23 DIAGNOSIS — R0689 Other abnormalities of breathing: Secondary | ICD-10-CM

## 2024-01-23 DIAGNOSIS — R0600 Dyspnea, unspecified: Secondary | ICD-10-CM

## 2024-01-25 ENCOUNTER — Ambulatory Visit: Admission: RE | Admit: 2024-01-25 | Source: Ambulatory Visit

## 2024-01-26 ENCOUNTER — Telehealth: Payer: Medicare Other | Admitting: Hospice and Palliative Medicine

## 2024-02-01 ENCOUNTER — Ambulatory Visit
Admission: RE | Admit: 2024-02-01 | Discharge: 2024-02-01 | Disposition: A | Discharge status: Home / Self Care | Source: Ambulatory Visit | Attending: Hematology | Admitting: Hematology

## 2024-02-01 ENCOUNTER — Ambulatory Visit
Admission: RE | Admit: 2024-02-01 | Discharge: 2024-02-01 | Disposition: A | Discharge status: Home / Self Care | Source: Ambulatory Visit | Attending: Radiology | Admitting: Radiology

## 2024-02-01 VITALS — BP 103/74 | HR 109

## 2024-02-01 DIAGNOSIS — C799 Secondary malignant neoplasm of unspecified site: Secondary | ICD-10-CM | POA: Diagnosis not present

## 2024-02-01 DIAGNOSIS — R0600 Dyspnea, unspecified: Secondary | ICD-10-CM | POA: Diagnosis present

## 2024-02-01 DIAGNOSIS — J90 Pleural effusion, not elsewhere classified: Secondary | ICD-10-CM | POA: Insufficient documentation

## 2024-02-01 DIAGNOSIS — Z9889 Other specified postprocedural states: Secondary | ICD-10-CM

## 2024-02-01 DIAGNOSIS — C3490 Malignant neoplasm of unspecified part of unspecified bronchus or lung: Secondary | ICD-10-CM | POA: Insufficient documentation

## 2024-02-01 DIAGNOSIS — R058 Other specified cough: Secondary | ICD-10-CM | POA: Diagnosis present

## 2024-02-01 DIAGNOSIS — J189 Pneumonia, unspecified organism: Secondary | ICD-10-CM | POA: Insufficient documentation

## 2024-02-01 DIAGNOSIS — Z48813 Encounter for surgical aftercare following surgery on the respiratory system: Secondary | ICD-10-CM | POA: Diagnosis not present

## 2024-02-01 DIAGNOSIS — R0689 Other abnormalities of breathing: Secondary | ICD-10-CM | POA: Diagnosis present

## 2024-02-01 MED ORDER — LIDOCAINE HCL (PF) 1 % IJ SOLN
10.0000 mL | Freq: Once | INTRAMUSCULAR | Status: AC
  Administered 2024-02-01: 10 mL via INTRADERMAL
  Filled 2024-02-01: qty 10

## 2024-02-01 NOTE — Procedures (Signed)
 PROCEDURE SUMMARY:  Successful US guided left thoracentesis. Yielded 600 mL of clear, orange fluid. Patient tolerated procedure well. No immediate complications. EBL = trace  Post procedure chest X-ray reveals no pneumothorax  Loman Brooklyn PA-C 02/01/2024 10:49 AM

## 2024-02-09 ENCOUNTER — Other Ambulatory Visit: Payer: Self-pay

## 2024-02-09 ENCOUNTER — Telehealth: Payer: Self-pay | Admitting: *Deleted

## 2024-02-09 DIAGNOSIS — C3412 Malignant neoplasm of upper lobe, left bronchus or lung: Secondary | ICD-10-CM

## 2024-02-09 NOTE — Telephone Encounter (Signed)
 Order placed for Pleurx cath insertion. Spoke to [place] and provided with IR number for her to call and set up appt.

## 2024-02-09 NOTE — Telephone Encounter (Signed)
 Joe Morton would like to have the pleurex done on tues next week and she would like to have call back with if the plan can be done

## 2024-02-11 NOTE — Progress Notes (Signed)
 Patient's family member answered call on 01/15/24, notified [facility] patient is now on hospice. [facility] referral closed.

## 2024-02-20 NOTE — Progress Notes (Signed)
 Gilmer Mor, DO sent to Markus Daft; P Ir Procedure Requests OK for image guided (VIR) left tunneled pleurX placement.  [address]

## 2024-02-20 DEATH — deceased

## 2024-02-29 ENCOUNTER — Ambulatory Visit: Payer: Medicare Other | Admitting: Pulmonary Disease

## 2024-06-25 ENCOUNTER — Other Ambulatory Visit (HOSPITAL_COMMUNITY): Payer: Self-pay
